# Patient Record
Sex: Female | Born: 1985 | Race: White | Hispanic: No | Smoking: Former smoker
Health system: Southern US, Community
[De-identification: ages and names within clinical notes are randomized; demographics above are authoritative.]

## PROBLEM LIST (undated history)

## (undated) ENCOUNTER — Inpatient Hospital Stay (HOSPITAL_COMMUNITY): Payer: Self-pay

## (undated) DIAGNOSIS — Z87898 Personal history of other specified conditions: Secondary | ICD-10-CM

## (undated) DIAGNOSIS — Z789 Other specified health status: Secondary | ICD-10-CM

## (undated) DIAGNOSIS — Z8742 Personal history of other diseases of the female genital tract: Secondary | ICD-10-CM

## (undated) DIAGNOSIS — B009 Herpesviral infection, unspecified: Secondary | ICD-10-CM

## (undated) DIAGNOSIS — F319 Bipolar disorder, unspecified: Secondary | ICD-10-CM

## (undated) DIAGNOSIS — F1911 Other psychoactive substance abuse, in remission: Secondary | ICD-10-CM

## (undated) DIAGNOSIS — Z8659 Personal history of other mental and behavioral disorders: Secondary | ICD-10-CM

## (undated) DIAGNOSIS — F1010 Alcohol abuse, uncomplicated: Secondary | ICD-10-CM

## (undated) DIAGNOSIS — F419 Anxiety disorder, unspecified: Secondary | ICD-10-CM

## (undated) DIAGNOSIS — F1011 Alcohol abuse, in remission: Secondary | ICD-10-CM

## (undated) DIAGNOSIS — N201 Calculus of ureter: Secondary | ICD-10-CM

## (undated) DIAGNOSIS — F32A Depression, unspecified: Secondary | ICD-10-CM

## (undated) DIAGNOSIS — F329 Major depressive disorder, single episode, unspecified: Secondary | ICD-10-CM

## (undated) DIAGNOSIS — Z915 Personal history of self-harm: Secondary | ICD-10-CM

## (undated) DIAGNOSIS — B1920 Unspecified viral hepatitis C without hepatic coma: Secondary | ICD-10-CM

## (undated) DIAGNOSIS — Z862 Personal history of diseases of the blood and blood-forming organs and certain disorders involving the immune mechanism: Secondary | ICD-10-CM

## (undated) DIAGNOSIS — Z0282 Encounter for adoption services: Secondary | ICD-10-CM

## (undated) HISTORY — DX: Depression, unspecified: F32.A

## (undated) HISTORY — DX: Anxiety disorder, unspecified: F41.9

## (undated) HISTORY — PX: FRACTURE SURGERY: SHX138

## (undated) HISTORY — DX: Major depressive disorder, single episode, unspecified: F32.9

---
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
## 2007-03-18 ENCOUNTER — Inpatient Hospital Stay (HOSPITAL_COMMUNITY): Admission: AD | Admit: 2007-03-18 | Discharge: 2007-03-18 | Payer: Self-pay | Admitting: Obstetrics and Gynecology

## 2007-03-22 ENCOUNTER — Inpatient Hospital Stay (HOSPITAL_COMMUNITY): Admission: AD | Admit: 2007-03-22 | Discharge: 2007-03-23 | Payer: Self-pay | Admitting: Obstetrics and Gynecology

## 2007-04-02 ENCOUNTER — Encounter (INDEPENDENT_AMBULATORY_CARE_PROVIDER_SITE_OTHER): Payer: Self-pay | Admitting: Obstetrics & Gynecology

## 2007-04-02 ENCOUNTER — Inpatient Hospital Stay (HOSPITAL_COMMUNITY): Admission: RE | Admit: 2007-04-02 | Discharge: 2007-04-05 | Payer: Self-pay | Admitting: Obstetrics & Gynecology

## 2008-03-27 HISTORY — PX: CLOSED REDUCTION METACARPAL WITH PERCUTANEOUS PINNING: SHX5613

## 2009-06-28 ENCOUNTER — Emergency Department (HOSPITAL_COMMUNITY): Admission: EM | Admit: 2009-06-28 | Discharge: 2009-06-28 | Payer: Self-pay | Admitting: Emergency Medicine

## 2009-08-01 ENCOUNTER — Emergency Department (HOSPITAL_COMMUNITY): Admission: EM | Admit: 2009-08-01 | Discharge: 2009-08-01 | Payer: Self-pay | Admitting: Emergency Medicine

## 2010-06-21 ENCOUNTER — Inpatient Hospital Stay (HOSPITAL_COMMUNITY): Admission: RE | Admit: 2010-06-21 | Payer: Self-pay | Source: Ambulatory Visit

## 2010-06-21 ENCOUNTER — Inpatient Hospital Stay (INDEPENDENT_AMBULATORY_CARE_PROVIDER_SITE_OTHER)
Admission: RE | Admit: 2010-06-21 | Discharge: 2010-06-21 | Disposition: A | Discharge status: Home / Self Care | Source: Ambulatory Visit | Attending: Family Medicine | Admitting: Family Medicine

## 2010-06-21 DIAGNOSIS — J019 Acute sinusitis, unspecified: Secondary | ICD-10-CM

## 2010-06-21 DIAGNOSIS — J020 Streptococcal pharyngitis: Secondary | ICD-10-CM

## 2010-07-16 ENCOUNTER — Inpatient Hospital Stay (INDEPENDENT_AMBULATORY_CARE_PROVIDER_SITE_OTHER)
Admission: RE | Admit: 2010-07-16 | Discharge: 2010-07-16 | Disposition: A | Discharge status: Home / Self Care | Source: Ambulatory Visit | Attending: Family Medicine | Admitting: Family Medicine

## 2010-07-16 DIAGNOSIS — F329 Major depressive disorder, single episode, unspecified: Secondary | ICD-10-CM

## 2010-07-16 DIAGNOSIS — F411 Generalized anxiety disorder: Secondary | ICD-10-CM

## 2010-08-09 NOTE — H&P (Signed)
NAME:  Allison Richard, Allison Richard NO.:  [id number]   MEDICAL RECORD NO.:  [id number]          PATIENT TYPE:  INP   LOCATION:  NA                            FACILITY:  [facility]   PHYSICIAN:  Freddy Finner, M.D.   DATE OF BIRTH:  04-22-1985   DATE OF ADMISSION:  DATE OF DISCHARGE:                              HISTORY & PHYSICAL   ADMITTING DIAGNOSES:  1. Intrauterine pregnancy at term.  Mature fetus by amniocentesis on      April 01, 2007.  2. History of genital herpes.  3. Clinically small pelvis.  4. Patient requests for primary cesarean delivery.   SECONDARY DIAGNOSIS:  Persistent 6 to 7 cm left ovarian cyst with  secondary procedure to resect the ovarian cyst.   The patient is a 25 year old white female primigravida whose prenatal  course has been complicated by persistent presence of a large left  adnexal mass consistent with a large simple ovarian cyst.  The size  precludes functional type cyst, and she was also known to have a corpus  luteum cyst of the right ovary in early pregnancy.  The cyst has been  intermittently caused pain.  She did have preterm labor which was  arrested with IV hydration and IV pain medication.  She has a relatively  small bony pelvis.  Due to her history of genital herpes and the  probability of a laparotomy for resection of an ovarian cyst in the  postpartum period, the patient is requested primary cesarean delivery to  address resection of the ovarian cyst.  The amniocentesis performed on  April 01, 2007 showed the presence of PG and an LS ratio 5.6 to 1.   CURRENT REVIEW OF SYSTEMS:  Is otherwise negative.   PAST MEDICAL HISTORY:  Recorded in detail in the prenatal summary and  will not be repeated here.  Group B strep culture vaginal was negative  from March 19, 2007.   PHYSICAL EXAMINATION:  HEENT:  Is grossly within normal limits.  Thyroid  gland is not palpably enlarged.  Blood pressure in the office was  100/60.  CHEST:  Is  clear to auscultation.  HEART:  Normal sinus rhythm without murmurs, rubs or gallops.  ABDOMEN:  Is gravid.  Estimated fetal weight on ultrasound on the day  prior admission is 3465 g.  PELVIC EXAMINATION:  Is deferred on the day prior to admission.  EXTREMITIES:  Without cyanosis, clubbing or edema.   ASSESSMENT:  1. Intrauterine pregnancy at term with mature LS ratio and positive PG      by amniocentesis.  2. Genital herpes.  3. Persistent large cystic left adnexal mass.   PLAN:  Primary cesarean delivery and resection of left ovarian cyst.      Freddy Finner, M.D.  Electronically Signed     WRN/MEDQ  D:  04/01/2007  T:  04/02/2007  Job:  [id number]

## 2010-08-09 NOTE — Op Note (Signed)
NAME:  Allison Richard, Allison Richard NO.:  [id number]   MEDICAL RECORD NO.:  [id number]          PATIENT TYPE:  INP   LOCATION:  9123                          FACILITY:  [facility]   PHYSICIAN:  Freddy Finner, M.D.   DATE OF BIRTH:  [birth year]   DATE OF PROCEDURE:  04/02/2007  DATE OF DISCHARGE:                               OPERATIVE REPORT   PREOPERATIVE DIAGNOSES:  1. Intrauterine pregnancy at approximately [redacted] weeks gestation by      ultrasound and [redacted] weeks gestation by dates with known fetal lung      maturity determined by amniocentesis on 04/01/2007 with a 5.1/1 L/S      ratio and positive PG.  2. Persistent 6-7 cm cystic mass in the left adnexa.  3. Positive history of genital herpes.  4. Small bony pelvis.  5. Small-built young white female nulligravida.  6. Patient request for definitive surgical intervention to deal with      the ovarian cyst as well as the delivery of pregnancy.   POSTOPERATIVE DIAGNOSES:  1. Intrauterine pregnancy at approximately [redacted] weeks gestation by      ultrasound and [redacted] weeks gestation by dates with known fetal lung      maturity determined by amniocentesis on 04/01/2007 with a 5.1/1 L/S      ratio and positive PG.  2. Persistent 6-7 cm cystic mass in the left adnexa.  3. Positive history of genital herpes.  4. Small bony pelvis.  5. Small-built young white female nulligravida.  6. Patient request for definitive surgical intervention to deal with      the ovarian cyst as well as the delivery of pregnancy.   OPERATIVE PROCEDURE:  1. Primary low transverse cervical cesarean section with delivery of      viable female infant, Apgar's of 8 and 9.  Birth weight 7 pounds 10      ounces.  2. Left ovarian cystectomy of what appeared to be serous or mucinous      cystadenoma of the left ovary.   SURGEON:  Freddy Finner, M.D.   ANESTHESIA:  Spinal.   ESTIMATED BLOOD LOSS:  800 mL.   INTRAOPERATIVE COMPLICATIONS:  None.   DETAILS OF PRESENT  ILLNESS:  According to the admission note.  The  patient was admitted on the morning of surgery.   DESCRIPTION OF PROCEDURE:  She was brought to the operating room, placed  under adequate spinal anesthesia, placed in the dorsal recumbent  position with elevation of the right hip by 15 degrees.  Pillow was  folded under the patient's knees for prevention sciatica as a  postoperative complication.  Table positioned.  Abdomen was carefully  prepped with Betadine scrub followed by Betadine solution.  The Foley  catheter was placed using sterile technique.  Sterile drapes were then  applied.  Lower abdominal transverse skin incision was made and carried  sharply down to fascia.  Fascia was entered sharply and extended to the  extent of skin incision.  Rectus sheath developed superiorly and  inferiorly with blunt and sharp dissection.  Bleeding subcutaneous and  subfascial vessels were controlled with  Bovie.  Rectus muscle divided in  the midline.  Peritoneum was elevated and entered sharply, extended  bluntly to the extent of the skin incision.  Bladder blade was placed.  Transverse incision was made in the viscera peritoneum overlying the  lower uterine segment, and the bladder dissected off the lower segment.  Transverse incision was made in the lower uterine segment.  Abdomen was  entered.  Fluid was clear.  Incision was extended bluntly in a  transverse direction to the extent of the skin incision.  A Kiwi device  was required for delivery of a viable female infant.  A single loose loop  of nuchal cord was reduced.  Apgar's of 8 and 9 were assigned by the  NICU team in attendance.  The cord was very thin and small with no  umbilical jelly.  Attempt was made to collect an arterial blood sample  but was unsuccessful in collecting that as there was inadequate specimen  for analysis because of the cord.  A venous routine sample was  collected.  Placenta and other products of conception were  removed from  the uterus.  The uterus, tubes and ovaries were delivered onto the  anterior abdominal wall.  The right fallopian tube was normal.  There  was a theca lutein cyst on the right ovary.  The left ovary had theca  lutein components, and the proximal cortical portion, the more distal  portion of the ovary was markedly enlarged with a soft, clear, somewhat  lobulated appearance.  The uterine incision was then closed in a double  layered fashion of running 0 Monocryl used for the first layer with a  locking suture and imbricating suture of 0 Monocryl was used for the  second layer.  Hemostasis was complete.  The bladder peritoneum was re-  approximated with a figure-of-8 in the midline.  Irrigation was carried  out.  Hemostasis was again noted to be complete.  The uterus and right  tube and ovary were wrapped in sloppy wet packs.  A moist pack was  placed under the left ovary.  A careful sharp dissection with scalpel  was made into the superficial capsule of the ovarian cyst, and initially  blunt dissection and then sharp dissection was carried out to partially  free the cyst.  There was dense attachment to the ovarian cortex, and  dissection of this area of the cyst actually ruptured.  A clear yellow  slightly viscus fluid spilled from the cyst.  Great care was taken to  completely excise the entire cyst lining and included with this was some  cortical ovarian tissue which was overlying the superior portion of the  cyst.  Bleeding vessels were then suture ligated with 3-0 Monocryl.  The  cortical defect was then closed with running 3-0 Monocryl sutures.  A  small amount of oozing at the mid portion of the ovary was visually  controlled with a mattress suture of 3-0 Monocryl through the medullary  portion of the ovary.  Again, copious irrigation was carried out.  Hemostasis was confirmed to be complete.  The ovary was wrapped in  Intercede and the uterus and both tubes and ovaries  were delivered back  into the abdominal cavity.  Inspection was made to insure that the  Intercede remained over the left ovary.  All packs, needle and  instrument counts were then confirmed to be complete.  The abdominal  incision was then closed in layers.  A running 0 Monocryl was used  to  close the peritoneum and re-approximate the rectus muscles.  A double-  looped 0 PDS was then use to close the fascia in running fashion.  Subcutaneous tissue was approximated with a running 2-0 plain.  Skin was  closed with skin staples and Steri-Strips.  Sterile compression dressing  was applied.  The patient was taken to the recovery room in satisfactory  condition.      Freddy Finner, M.D.  Electronically Signed     WRN/MEDQ  D:  04/02/2007  T:  04/03/2007  Job:  [id number]

## 2010-08-12 NOTE — Discharge Summary (Signed)
NAME:  Allison Richard, Allison Richard NO.:  [id number]   MEDICAL RECORD NO.:  [id number]          PATIENT TYPE:  INP   LOCATION:  9123                          [facility]ILITY[facility]   PHYSICIAN:  Duke Salvia. Marcelle Overlie, M.D.DATE OF BIRTH:  [birth year]   DATE OF ADMISSION:  04/02/2007  DATE OF DISCHARGE:  04/05/2007                               DISCHARGE SUMMARY   ADMITTING DIAGNOSES:  1. Intrauterine pregnancy at term.  2. History of genital herpes.  3. Clinically small pelvis, desires primary cesarean delivery.  4. Persistent left ovarian cyst.   DISCHARGE DIAGNOSIS:  Status post low transverse cesarean section to a  viable female infant.   PROCEDURE:  1. Primary low transverse cesarean section.  2. Left ovarian cystectomy.   REASON FOR ADMISSION:  Please see dictated H&P.   HOSPITAL COURSE:  The patient is a 26 year old white single female  primigravida that was admitted to [facility] for a  scheduled cesarean section.  The patient did have a history of genital  herpes and was known to have a clinically small pelvis.  The patient  also had a persistent 6-7 cm left ovarian cyst which was thought to be a  large simple ovarian cyst.  On the morning of admission the patient was  taken to operating room where spinal anesthesia was administered without  difficulty.  A low transverse incision was made with delivery of a  viable female infant weighing 7 pounds 10 ounces, Apgars of 8 at one  minute and 9 at five minutes. A left ovarian cystectomy was formed  without difficulty and the patient tolerated the procedure well and was  taken to the recovery room in stable condition.   On postoperative day #1 the patient was without complaint.  Vital signs  were stable.  She is afebrile.  Abdomen soft.  Fundus firm and  nontender.  Abdominal dressing was noted to be clean, dry and intact.   LABORATORY FINDINGS:  Revealed hemoglobin of 7.9, platelet count of  212,0000, WBC count of  12.0.  The patient was started on some iron  supplementation.   On the following morning the patient without complaint.  Vital signs  were stable.  She was afebrile.  Fundus firm and nontender.  Incision  was clean, dry and intact.  She was ambulating well.  On postoperative  day #3 the patient was without complaint.  Vital signs remained stable.  She is afebrile.  Fundus firm and nontender.  Incision was clean, dry  and intact.  Staples removed and the patient was later discharged home.   CONDITION ON DISCHARGE:  Good.   DIET:  Regular as tolerated.   ACTIVITY:  No heavy lifting, no driving x2 weeks.  No vaginal entry.   FOLLOW UP:  The patient to follow up in the office in 1-2 weeks for  incision check.  She is to call for temperature greater than 100  degrees, persistent nausea, vomiting, heavy vaginal bleeding and/or  redness or drainage from incisional site.   DISCHARGE MEDICATION:  1. Tylox #30, one p.o. every 4-6 hours p.r.n.  2. Motrin 600 mg every  6 hours.  3. Prenatal vitamins one p.o. daily.  4. Ferrous sulfate 325 mg one p.o. b.i.d.   Otherwise the patient to return to the office as scheduled.      Julio Sicks, N.P.      Richard M. Marcelle Overlie, M.D.  Electronically Signed    CC/MEDQ  D:  04/16/2007  T:  04/16/2007  Job:  [id number]

## 2010-12-15 LAB — CBC
Hemoglobin: 10.5 — ABNORMAL LOW
Hemoglobin: 7.9 — CL
MCHC: 33.6
MCV: 77.1 — ABNORMAL LOW
Platelets: 212
RBC: 3.04 — ABNORMAL LOW
RBC: 4.06

## 2010-12-15 LAB — RPR: RPR Ser Ql: NONREACTIVE

## 2010-12-30 LAB — URINALYSIS, ROUTINE W REFLEX MICROSCOPIC
Glucose, UA: NEGATIVE
Hgb urine dipstick: NEGATIVE
Nitrite: NEGATIVE
Protein, ur: NEGATIVE
Urobilinogen, UA: 0.2

## 2011-01-12 LAB — URINALYSIS, ROUTINE W REFLEX MICROSCOPIC
Bilirubin Urine: NEGATIVE
Glucose, UA: NEGATIVE
Nitrite: NEGATIVE
pH: 6

## 2011-01-12 LAB — POCT PREGNANCY, URINE
Operator id: 26329
Preg Test, Ur: POSITIVE

## 2011-03-30 ENCOUNTER — Ambulatory Visit (INDEPENDENT_AMBULATORY_CARE_PROVIDER_SITE_OTHER): Payer: 59

## 2011-03-30 DIAGNOSIS — H66009 Acute suppurative otitis media without spontaneous rupture of ear drum, unspecified ear: Secondary | ICD-10-CM

## 2011-04-07 ENCOUNTER — Ambulatory Visit (INDEPENDENT_AMBULATORY_CARE_PROVIDER_SITE_OTHER): Payer: 59

## 2011-04-07 DIAGNOSIS — J309 Allergic rhinitis, unspecified: Secondary | ICD-10-CM

## 2011-04-07 DIAGNOSIS — H6980 Other specified disorders of Eustachian tube, unspecified ear: Secondary | ICD-10-CM

## 2011-10-29 ENCOUNTER — Ambulatory Visit (INDEPENDENT_AMBULATORY_CARE_PROVIDER_SITE_OTHER): Payer: 59 | Admitting: Internal Medicine

## 2011-10-29 VITALS — BP 120/70 | HR 80 | Resp 16 | Ht 64.5 in | Wt 122.0 lb

## 2011-10-29 DIAGNOSIS — G4700 Insomnia, unspecified: Secondary | ICD-10-CM

## 2011-10-29 DIAGNOSIS — F418 Other specified anxiety disorders: Secondary | ICD-10-CM

## 2011-10-29 DIAGNOSIS — F341 Dysthymic disorder: Secondary | ICD-10-CM

## 2011-10-29 DIAGNOSIS — L708 Other acne: Secondary | ICD-10-CM

## 2011-10-29 DIAGNOSIS — L709 Acne, unspecified: Secondary | ICD-10-CM

## 2011-10-29 MED ORDER — DOXYCYCLINE HYCLATE 100 MG PO TABS: 100.0000 mg | ORAL_TABLET | Freq: Two times a day (BID) | ORAL | Status: AC

## 2011-10-29 MED ORDER — TRAZODONE HCL 100 MG PO TABS: 100.0000 mg | ORAL_TABLET | Freq: Every day | ORAL | Status: DC

## 2011-10-29 MED ORDER — SERTRALINE HCL 50 MG PO TABS: ORAL_TABLET | ORAL | Status: DC
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
## 2011-11-01 ENCOUNTER — Emergency Department (HOSPITAL_COMMUNITY)
Admission: EM | Admit: 2011-11-01 | Discharge: 2011-11-02 | Disposition: A | Discharge status: Home / Self Care | Payer: No Typology Code available for payment source | Attending: Emergency Medicine | Admitting: Emergency Medicine

## 2011-11-01 ENCOUNTER — Emergency Department (HOSPITAL_COMMUNITY): Payer: No Typology Code available for payment source

## 2011-11-01 DIAGNOSIS — S51009A Unspecified open wound of unspecified elbow, initial encounter: Secondary | ICD-10-CM | POA: Insufficient documentation

## 2011-11-01 DIAGNOSIS — S0003XA Contusion of scalp, initial encounter: Secondary | ICD-10-CM | POA: Insufficient documentation

## 2011-11-01 DIAGNOSIS — S41112A Laceration without foreign body of left upper arm, initial encounter: Secondary | ICD-10-CM

## 2011-11-01 DIAGNOSIS — S01511A Laceration without foreign body of lip, initial encounter: Secondary | ICD-10-CM

## 2011-11-01 DIAGNOSIS — Y92410 Unspecified street and highway as the place of occurrence of the external cause: Secondary | ICD-10-CM | POA: Insufficient documentation

## 2011-11-01 DIAGNOSIS — Z1881 Retained glass fragments: Secondary | ICD-10-CM | POA: Insufficient documentation

## 2011-11-01 DIAGNOSIS — S01501A Unspecified open wound of lip, initial encounter: Secondary | ICD-10-CM | POA: Insufficient documentation

## 2011-11-01 LAB — POCT I-STAT, CHEM 8
BUN: 11 mg/dL (ref 6–23)
Calcium, Ion: 1.12 mmol/L (ref 1.12–1.23)
Chloride: 105 mEq/L (ref 96–112)
Glucose, Bld: 105 mg/dL — ABNORMAL HIGH (ref 70–99)

## 2011-11-01 MED ORDER — FENTANYL CITRATE 0.05 MG/ML IJ SOLN
50.0000 ug | Freq: Once | INTRAMUSCULAR | Status: AC
  Administered 2011-11-01: 50 ug via INTRAVENOUS
  Filled 2011-11-01: qty 2
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
## 2011-11-01 NOTE — ED Notes (Signed)
X-ray at bedside

## 2011-11-02 ENCOUNTER — Emergency Department (HOSPITAL_COMMUNITY): Payer: No Typology Code available for payment source

## 2011-11-02 ENCOUNTER — Encounter (HOSPITAL_COMMUNITY): Payer: Self-pay | Admitting: Radiology

## 2011-11-02 ENCOUNTER — Other Ambulatory Visit (HOSPITAL_COMMUNITY): Payer: Self-pay

## 2011-11-02 LAB — COMPREHENSIVE METABOLIC PANEL
ALT: 10 U/L (ref 0–35)
AST: 21 U/L (ref 0–37)
CO2: 22 mEq/L (ref 19–32)
Chloride: 103 mEq/L (ref 96–112)
Creatinine, Ser: 0.73 mg/dL (ref 0.50–1.10)
GFR calc non Af Amer: >90 mL/min (ref >90)
Total Bilirubin: 0.5 mg/dL (ref 0.3–1.2)

## 2011-11-02 LAB — URINALYSIS, ROUTINE W REFLEX MICROSCOPIC
Glucose, UA: NEGATIVE mg/dL
Ketones, ur: NEGATIVE mg/dL
Protein, ur: NEGATIVE mg/dL

## 2011-11-02 LAB — CBC
MCH: 29.5 pg (ref 26.0–34.0)
MCHC: 35 g/dL (ref 30.0–36.0)
Platelets: 232 10*3/uL (ref 150–400)
RBC: 4.38 MIL/uL (ref 3.87–5.11)
RDW: 12.7 % (ref 11.5–15.5)

## 2011-11-02 LAB — LACTIC ACID, PLASMA: Lactic Acid, Venous: 2.5 mmol/L — ABNORMAL HIGH (ref 0.5–2.2)

## 2011-11-02 LAB — POCT PREGNANCY, URINE: Preg Test, Ur: NEGATIVE

## 2011-11-02 MED ORDER — MORPHINE SULFATE 4 MG/ML IJ SOLN
4.0000 mg | Freq: Once | INTRAMUSCULAR | Status: AC
  Administered 2011-11-02: 4 mg via INTRAVENOUS
  Filled 2011-11-02: qty 1

## 2011-11-02 MED ORDER — ONDANSETRON HCL 4 MG PO TABS: 4.0000 mg | ORAL_TABLET | Freq: Four times a day (QID) | ORAL | Status: AC

## 2011-11-02 MED ORDER — OXYCODONE-ACETAMINOPHEN 5-325 MG PO TABS
1.0000 | ORAL_TABLET | Freq: Once | ORAL | Status: AC
  Administered 2011-11-02: 1 via ORAL
  Filled 2011-11-02: qty 1

## 2011-11-02 MED ORDER — OXYCODONE-ACETAMINOPHEN 5-325 MG PO TABS: 2.0000 | ORAL_TABLET | ORAL | Status: AC | PRN

## 2011-11-02 MED ORDER — IOHEXOL 300 MG/ML  SOLN
100.0000 mL | Freq: Once | INTRAMUSCULAR | Status: AC | PRN
  Administered 2011-11-02: 100 mL via INTRAVENOUS

## 2011-11-02 MED ORDER — HYDROCODONE-ACETAMINOPHEN 5-325 MG PO TABS: 2.0000 | ORAL_TABLET | ORAL | Status: DC | PRN

## 2011-11-02 MED ORDER — IBUPROFEN 800 MG PO TABS
800.0000 mg | ORAL_TABLET | Freq: Once | ORAL | Status: AC
  Administered 2011-11-02: 800 mg via ORAL
  Filled 2011-11-02: qty 1

## 2011-11-02 MED ORDER — ONDANSETRON 4 MG PO TBDP
8.0000 mg | ORAL_TABLET | Freq: Once | ORAL | Status: AC
  Administered 2011-11-02: 8 mg via ORAL
  Filled 2011-11-02: qty 2

## 2011-11-02 MED ORDER — OXYCODONE-ACETAMINOPHEN 5-325 MG PO TABS: 2.0000 | ORAL_TABLET | ORAL | Status: DC | PRN

## 2011-11-02 NOTE — ED Notes (Signed)
Patient transported to CT 
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
## 2011-11-02 NOTE — ED Notes (Signed)
Dr Norlene Campbell at bedside for suture repair
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
## 2011-11-02 NOTE — ED Notes (Signed)
Pt experienced nausea earlier with some vomiting. Pt received ODT Zofran and then given pain med before discharge. Pt still having some nausea. Dr. Norlene Campbell updated. Pt has rx for zofran. No additional orders.

## 2011-11-07 ENCOUNTER — Ambulatory Visit (INDEPENDENT_AMBULATORY_CARE_PROVIDER_SITE_OTHER): Payer: 59 | Admitting: Internal Medicine

## 2011-11-07 VITALS — BP 102/60 | HR 76 | Temp 98.3°F | Resp 18 | Ht 64.5 in | Wt 120.0 lb

## 2011-11-07 DIAGNOSIS — S01511A Laceration without foreign body of lip, initial encounter: Secondary | ICD-10-CM

## 2011-11-07 DIAGNOSIS — S41102A Unspecified open wound of left upper arm, initial encounter: Secondary | ICD-10-CM

## 2011-11-07 DIAGNOSIS — S41109A Unspecified open wound of unspecified upper arm, initial encounter: Secondary | ICD-10-CM

## 2011-11-07 DIAGNOSIS — S01501A Unspecified open wound of lip, initial encounter: Secondary | ICD-10-CM

## 2011-11-07 MED ORDER — MUPIROCIN CALCIUM 2 % EX CREA: TOPICAL_CREAM | Freq: Three times a day (TID) | CUTANEOUS | Status: AC

## 2011-11-07 NOTE — Patient Instructions (Addendum)
Thank you for coming in today. Please apply vasoline as needed to that lip wound.  When the scab falls off if you see a little blue thread come back and we can pull it out.  The absorbable sutures will fall off/out within 1-3 weeks.  The arm wound looks good. Continue mupericin cream as needed.  Thre back of the head wound should heel well.  If it does not get better please come back.
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
## 2011-12-08 ENCOUNTER — Encounter: Payer: Self-pay | Admitting: Internal Medicine

## 2011-12-08 ENCOUNTER — Ambulatory Visit (INDEPENDENT_AMBULATORY_CARE_PROVIDER_SITE_OTHER): Payer: 59 | Admitting: Internal Medicine

## 2011-12-08 VITALS — BP 93/56 | HR 77 | Temp 97.9°F | Resp 16 | Ht 64.75 in | Wt 121.4 lb

## 2011-12-08 DIAGNOSIS — S139XXA Sprain of joints and ligaments of unspecified parts of neck, initial encounter: Secondary | ICD-10-CM

## 2011-12-08 DIAGNOSIS — F341 Dysthymic disorder: Secondary | ICD-10-CM

## 2011-12-08 DIAGNOSIS — L708 Other acne: Secondary | ICD-10-CM

## 2011-12-08 DIAGNOSIS — S5000XA Contusion of unspecified elbow, initial encounter: Secondary | ICD-10-CM

## 2011-12-08 DIAGNOSIS — L709 Acne, unspecified: Secondary | ICD-10-CM | POA: Insufficient documentation

## 2011-12-08 DIAGNOSIS — G4700 Insomnia, unspecified: Secondary | ICD-10-CM | POA: Insufficient documentation

## 2011-12-08 DIAGNOSIS — S161XXA Strain of muscle, fascia and tendon at neck level, initial encounter: Secondary | ICD-10-CM

## 2011-12-08 DIAGNOSIS — S5002XA Contusion of left elbow, initial encounter: Secondary | ICD-10-CM

## 2011-12-08 DIAGNOSIS — F418 Other specified anxiety disorders: Secondary | ICD-10-CM | POA: Insufficient documentation

## 2011-12-08 DIAGNOSIS — S01511A Laceration without foreign body of lip, initial encounter: Secondary | ICD-10-CM

## 2011-12-08 MED ORDER — PREDNISONE 20 MG PO TABS: ORAL_TABLET | ORAL | Status: DC

## 2011-12-08 MED ORDER — TRAZODONE HCL 100 MG PO TABS: 200.0000 mg | ORAL_TABLET | Freq: Every day | ORAL | Status: DC

## 2011-12-08 MED ORDER — CLINDAMYCIN PHOS-BENZOYL PEROX 1-5 % EX GEL: Freq: Two times a day (BID) | CUTANEOUS | Status: DC

## 2011-12-08 MED ORDER — DOXYCYCLINE HYCLATE 100 MG PO TABS: 100.0000 mg | ORAL_TABLET | Freq: Every day | ORAL | Status: AC

## 2011-12-08 MED ORDER — CYCLOBENZAPRINE HCL 10 MG PO TABS: 10.0000 mg | ORAL_TABLET | Freq: Every day | ORAL | Status: AC

## 2011-12-08 MED ORDER — ALPRAZOLAM 0.5 MG PO TABS: 0.5000 mg | ORAL_TABLET | Freq: Three times a day (TID) | ORAL | Status: DC | PRN

## 2011-12-08 MED ORDER — SERTRALINE HCL 100 MG PO TABS: 200.0000 mg | ORAL_TABLET | Freq: Every day | ORAL | Status: DC
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
## 2012-01-29 ENCOUNTER — Ambulatory Visit (INDEPENDENT_AMBULATORY_CARE_PROVIDER_SITE_OTHER): Payer: 59 | Admitting: Internal Medicine

## 2012-01-29 VITALS — BP 107/71 | HR 121 | Temp 98.0°F | Resp 22 | Wt 124.0 lb

## 2012-01-29 DIAGNOSIS — F489 Nonpsychotic mental disorder, unspecified: Secondary | ICD-10-CM

## 2012-01-29 DIAGNOSIS — Z7289 Other problems related to lifestyle: Secondary | ICD-10-CM

## 2012-01-29 DIAGNOSIS — F341 Dysthymic disorder: Secondary | ICD-10-CM

## 2012-01-29 DIAGNOSIS — F418 Other specified anxiety disorders: Secondary | ICD-10-CM

## 2012-01-29 DIAGNOSIS — G4700 Insomnia, unspecified: Secondary | ICD-10-CM

## 2012-01-29 MED ORDER — QUETIAPINE FUMARATE 50 MG PO TABS: 50.0000 mg | ORAL_TABLET | Freq: Every day | ORAL | Status: DC

## 2012-01-29 MED ORDER — CLONAZEPAM 2 MG PO TABS: 2.0000 mg | ORAL_TABLET | Freq: Three times a day (TID) | ORAL | Status: DC | PRN

## 2012-01-29 NOTE — Patient Instructions (Signed)
Decrease trazodone to 100 mg at bedtime Decrease Zoloft to 150 mg daily Add Seroquel at bedtime Change from xanax to Klonopin for anxiety
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
## 2012-02-05 ENCOUNTER — Ambulatory Visit (INDEPENDENT_AMBULATORY_CARE_PROVIDER_SITE_OTHER): Payer: 59 | Admitting: Internal Medicine

## 2012-02-05 VITALS — BP 112/72 | HR 74 | Temp 97.6°F | Resp 18 | Ht 64.5 in | Wt 118.0 lb

## 2012-02-05 DIAGNOSIS — G4700 Insomnia, unspecified: Secondary | ICD-10-CM

## 2012-02-05 DIAGNOSIS — F418 Other specified anxiety disorders: Secondary | ICD-10-CM

## 2012-02-05 DIAGNOSIS — Z23 Encounter for immunization: Secondary | ICD-10-CM

## 2012-02-05 MED ORDER — CLONAZEPAM 2 MG PO TABS: 2.0000 mg | ORAL_TABLET | Freq: Three times a day (TID) | ORAL | Status: DC | PRN

## 2012-02-05 MED ORDER — SERTRALINE HCL 100 MG PO TABS: 150.0000 mg | ORAL_TABLET | Freq: Every day | ORAL | Status: DC

## 2012-02-05 MED ORDER — QUETIAPINE FUMARATE 50 MG PO TABS: 50.0000 mg | ORAL_TABLET | Freq: Every day | ORAL | Status: DC

## 2012-02-05 MED ORDER — TRAZODONE HCL 50 MG PO TABS: 50.0000 mg | ORAL_TABLET | Freq: Every day | ORAL | Status: DC

## 2012-02-05 NOTE — Progress Notes (Signed)
  Subjective:    Patient ID: Allison Richard, female    DOB: November 28, 1985, 26 y.o.   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 and and her mother has her own set of problems as well  Review of Systems Nausea and vomiting after [place] food last night and has a mild sore throat today    Objective:   Physical Exam  Throat clear except for redness No nodes Psych stable      Assessment & Plan:   1. Insomnia  Decrease trazodone to 50 and discontinued and 34 weeks if stable Continue Seroquel at 50  2. Depression with anxiety  Continue Zoloft at 150 mg Continue Klonopin and try to reduce dose as improves    Meds ordered this encounter  Medications  . clonazePAM (KLONOPIN) 2 MG tablet    Sig: Take 1 tablet (2 mg total) by mouth 3 (three) times daily as needed for anxiety. For 02/27/12 or after    Dispense:  30 tablet    Refill:  1  . sertraline (ZOLOFT) 100 MG tablet    Sig: Take 1.5 tablets (150 mg total) by mouth daily.    Dispense:  45 tablet    Refill:  2  . QUEtiapine (SEROQUEL) 50 MG tablet    Sig: Take 1 tablet (50 mg total) by mouth at bedtime. To begin 02/27/12 or after    Dispense:  30 tablet    Refill:  1  . traZODone (DESYREL) 50 MG tablet    Sig: Take 1 tablet (50 mg total) by mouth at bedtime.    Dispense:  30 tablet    Refill:  1   Followup 4-8 weeks depending on response sooner if worse Flu shot today

## 2012-02-14 ENCOUNTER — Telehealth: Payer: Self-pay

## 2012-02-14 ENCOUNTER — Encounter (HOSPITAL_COMMUNITY): Payer: Self-pay

## 2012-02-14 ENCOUNTER — Emergency Department (HOSPITAL_COMMUNITY)
Admission: EM | Admit: 2012-02-14 | Discharge: 2012-02-15 | Disposition: A | Discharge status: Home / Self Care | Payer: 59 | Attending: Emergency Medicine | Admitting: Emergency Medicine

## 2012-02-14 DIAGNOSIS — F411 Generalized anxiety disorder: Secondary | ICD-10-CM | POA: Insufficient documentation

## 2012-02-14 DIAGNOSIS — F603 Borderline personality disorder: Secondary | ICD-10-CM | POA: Insufficient documentation

## 2012-02-14 DIAGNOSIS — F17200 Nicotine dependence, unspecified, uncomplicated: Secondary | ICD-10-CM | POA: Insufficient documentation

## 2012-02-14 DIAGNOSIS — R002 Palpitations: Secondary | ICD-10-CM | POA: Insufficient documentation

## 2012-02-14 DIAGNOSIS — F410 Panic disorder [episodic paroxysmal anxiety] without agoraphobia: Secondary | ICD-10-CM | POA: Insufficient documentation

## 2012-02-14 DIAGNOSIS — R450 Nervousness: Secondary | ICD-10-CM | POA: Insufficient documentation

## 2012-02-14 DIAGNOSIS — R45851 Suicidal ideations: Secondary | ICD-10-CM | POA: Insufficient documentation

## 2012-02-14 DIAGNOSIS — F3160 Bipolar disorder, current episode mixed, unspecified: Secondary | ICD-10-CM | POA: Insufficient documentation

## 2012-02-14 DIAGNOSIS — R0602 Shortness of breath: Secondary | ICD-10-CM | POA: Insufficient documentation

## 2012-02-14 DIAGNOSIS — Z79899 Other long term (current) drug therapy: Secondary | ICD-10-CM | POA: Insufficient documentation

## 2012-02-14 DIAGNOSIS — F331 Major depressive disorder, recurrent, moderate: Secondary | ICD-10-CM | POA: Insufficient documentation

## 2012-02-14 HISTORY — DX: Bipolar disorder, unspecified: F31.9

## 2012-02-14 LAB — CBC WITH DIFFERENTIAL/PLATELET
Eosinophils Absolute: 0 10*3/uL (ref 0.0–0.7)
Hemoglobin: 13 g/dL (ref 12.0–15.0)
Lymphocytes Relative: 27 % (ref 12–46)
Lymphs Abs: 1.6 10*3/uL (ref 0.7–4.0)
MCH: 29.2 pg (ref 26.0–34.0)
Monocytes Relative: 10 % (ref 3–12)
Neutro Abs: 3.7 10*3/uL (ref 1.7–7.7)
Neutrophils Relative %: 63 % (ref 43–77)
RBC: 4.45 MIL/uL (ref 3.87–5.11)

## 2012-02-14 LAB — COMPREHENSIVE METABOLIC PANEL
Alkaline Phosphatase: 50 U/L (ref 39–117)
BUN: 14 mg/dL (ref 6–23)
CO2: 22 mEq/L (ref 19–32)
Chloride: 103 mEq/L (ref 96–112)
GFR calc Af Amer: >90 mL/min (ref >90)
Glucose, Bld: 83 mg/dL (ref 70–99)
Potassium: 3.3 mEq/L — ABNORMAL LOW (ref 3.5–5.1)
Total Bilirubin: 0.9 mg/dL (ref 0.3–1.2)

## 2012-02-14 LAB — URINALYSIS, ROUTINE W REFLEX MICROSCOPIC
Bilirubin Urine: NEGATIVE
Ketones, ur: NEGATIVE mg/dL
Nitrite: NEGATIVE
Urobilinogen, UA: 1 mg/dL (ref 0.0–1.0)

## 2012-02-14 LAB — PREGNANCY, URINE: Preg Test, Ur: NEGATIVE

## 2012-02-14 MED ORDER — ALUM & MAG HYDROXIDE-SIMETH 200-200-20 MG/5ML PO SUSP: 30.0000 mL | ORAL | Status: DC | PRN

## 2012-02-14 MED ORDER — NICOTINE 21 MG/24HR TD PT24: 21.0000 mg | MEDICATED_PATCH | Freq: Every day | TRANSDERMAL | Status: DC

## 2012-02-14 MED ORDER — LORAZEPAM 1 MG PO TABS
1.0000 mg | ORAL_TABLET | Freq: Three times a day (TID) | ORAL | Status: DC | PRN
  Administered 2012-02-14: 1 mg via ORAL
  Filled 2012-02-14: qty 1

## 2012-02-14 MED ORDER — ONDANSETRON HCL 4 MG PO TABS: 4.0000 mg | ORAL_TABLET | Freq: Three times a day (TID) | ORAL | Status: DC | PRN

## 2012-02-14 MED ORDER — IBUPROFEN 600 MG PO TABS: 600.0000 mg | ORAL_TABLET | Freq: Three times a day (TID) | ORAL | Status: DC | PRN

## 2012-02-14 MED ORDER — ZOLPIDEM TARTRATE 5 MG PO TABS: 5.0000 mg | ORAL_TABLET | Freq: Every evening | ORAL | Status: DC | PRN

## 2012-02-14 NOTE — ED Notes (Signed)
Patient reports that she has frequent panic attacks. Patient states that she is a cutter. Patient states her husband has been asking for a divorce and her mother tells her to cut herself.

## 2012-02-14 NOTE — ED Provider Notes (Signed)
Medical screening examination/treatment/procedure(s) were performed by non-physician practitioner and as supervising physician I was immediately available for consultation/collaboration.  Ethelda Chick, MD 02/14/12 [phone]
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
## 2012-02-14 NOTE — Telephone Encounter (Signed)
PTS MOM JUST WANTS TO MAKE DR DOOLITTLE AWARE THAT THEY HAD TO TAKE DAUGHTER TO EMERGENCY ROOM TONIGHT????

## 2012-02-14 NOTE — ED Provider Notes (Signed)
History     CSN: [id number]  Arrival date & time 02/14/12  1543   First MD Initiated Contact with Patient 02/14/12 1604      Chief Complaint  Patient presents with  . Medical Clearance  . Suicidal    (Consider location/radiation/quality/duration/timing/severity/associated sxs/prior treatment) HPI Comments: 26 year old female with a history of anxiety presents to the emergency department due to having a panic attack causing her difficulty breathing. Admits to frequent panic attacks almost daily. She has been admitted to the hospital multiple times for these panic attacks. She states her husband is deployed in length a divorce, has not spoken to him within the past 7 months. Her mother tells her that she thought her mother and "to go cut herself". Patient admits to hurting himself by cutting. Denies homicidal ideations. Denies alcohol or drug use. States she tries to see her primary care physician Dr. Merla Riches as frequent as possible to avoid admission for anxiety.  The history is provided by the patient. The history is limited by the condition of the patient.    Past Medical History  Diagnosis Date  . Anxiety   . Deliberate self-cutting   . Panic attack   . Bipolar 1 disorder     Past Surgical History  Procedure Date  . Cesarean section   . Fracture surgery   . Tonsilectomy, adenoidectomy, bilateral myringotomy and tubes     Family History  Problem Relation Age of Onset  . Bipolar disorder Brother     History  Substance Use Topics  . Smoking status: Current Some Day Smoker  . Smokeless tobacco: Never Used  . Alcohol Use: Yes     Comment: socially    OB History    Grav Para Term Preterm Abortions TAB SAB Ect Mult Living                  Review of Systems  Respiratory: Positive for shortness of breath.   Cardiovascular: Positive for palpitations.  Psychiatric/Behavioral: Positive for suicidal ideas and self-injury. Negative for hallucinations. The patient is  nervous/anxious.   All other systems reviewed and are negative.    Allergies  Vicodin  Home Medications   Current Outpatient Rx  Name  Route  Sig  Dispense  Refill  . CLONAZEPAM 2 MG PO TABS   Oral   Take 1 tablet (2 mg total) by mouth 3 (three) times daily as needed for anxiety. For 02/27/12 or after   30 tablet   1   . QUETIAPINE FUMARATE 50 MG PO TABS   Oral   Take 1 tablet (50 mg total) by mouth at bedtime. To begin 02/27/12 or after   30 tablet   1   . SERTRALINE HCL 100 MG PO TABS   Oral   Take 1.5 tablets (150 mg total) by mouth daily.   45 tablet   2   . TRAZODONE HCL 100 MG PO TABS   Oral   Take 100 mg by mouth at bedtime.           BP 143/97  Pulse 142  Temp 98.2 F (36.8 C) (Oral)  Resp 22  SpO2 100%  Physical Exam  Constitutional: She appears well-developed and well-nourished.       Anxious, tearful  HENT:  Head: Normocephalic and atraumatic.  Eyes: Conjunctivae normal and EOM are normal. Pupils are equal, round, and reactive to light.  Neck: Normal range of motion. Neck supple.  Cardiovascular: Regular rhythm and normal heart sounds.  Tachycardia present.   Pulmonary/Chest: Breath sounds normal. Tachypnea noted.  Skin:     Psychiatric: Her mood appears anxious. Her speech is rapid and/or pressured. She is is hyperactive. She exhibits a depressed mood. She expresses suicidal ideation. She expresses no homicidal ideation. She expresses suicidal plans. She expresses no homicidal plans.    ED Course  Procedures (including critical care time)  Labs Reviewed - No data to display No results found.   No diagnosis found.    MDM  26 y/o female with anxiety attack and SI. ACT team consulted and will evaluate patient.        Trevor Mace, PA-C 02/14/12 1621

## 2012-02-14 NOTE — ED Notes (Signed)
Contact Father- Allison Richard [phone]

## 2012-02-15 NOTE — ED Provider Notes (Signed)
Patient has been seen by telepsych, Dr Jacky Kindle.  He does not feel pt meets Baker Act critera, and is showing regressed attention seeking behavior.  He feels she would benefit from outpatient f/u, and is safe for d/c.  Olivia Mackie, MD 02/15/12 [phone]

## 2012-02-29 ENCOUNTER — Ambulatory Visit (INDEPENDENT_AMBULATORY_CARE_PROVIDER_SITE_OTHER): Payer: 59 | Admitting: Family Medicine

## 2012-02-29 ENCOUNTER — Emergency Department (HOSPITAL_COMMUNITY): Payer: 59

## 2012-02-29 ENCOUNTER — Emergency Department (HOSPITAL_COMMUNITY)
Admission: EM | Admit: 2012-02-29 | Discharge: 2012-02-29 | Disposition: A | Discharge status: Home / Self Care | Payer: 59 | Attending: Emergency Medicine | Admitting: Emergency Medicine

## 2012-02-29 ENCOUNTER — Encounter (HOSPITAL_COMMUNITY): Payer: Self-pay | Admitting: *Deleted

## 2012-02-29 VITALS — BP 110/74 | HR 110 | Resp 18 | Ht 65.0 in | Wt 121.0 lb

## 2012-02-29 DIAGNOSIS — R079 Chest pain, unspecified: Secondary | ICD-10-CM

## 2012-02-29 DIAGNOSIS — F17200 Nicotine dependence, unspecified, uncomplicated: Secondary | ICD-10-CM | POA: Insufficient documentation

## 2012-02-29 DIAGNOSIS — F411 Generalized anxiety disorder: Secondary | ICD-10-CM

## 2012-02-29 DIAGNOSIS — R209 Unspecified disturbances of skin sensation: Secondary | ICD-10-CM

## 2012-02-29 DIAGNOSIS — Z79899 Other long term (current) drug therapy: Secondary | ICD-10-CM | POA: Insufficient documentation

## 2012-02-29 DIAGNOSIS — F319 Bipolar disorder, unspecified: Secondary | ICD-10-CM | POA: Insufficient documentation

## 2012-02-29 DIAGNOSIS — F419 Anxiety disorder, unspecified: Secondary | ICD-10-CM

## 2012-02-29 DIAGNOSIS — R0789 Other chest pain: Secondary | ICD-10-CM | POA: Insufficient documentation

## 2012-02-29 DIAGNOSIS — R202 Paresthesia of skin: Secondary | ICD-10-CM

## 2012-02-29 LAB — CBC WITH DIFFERENTIAL/PLATELET
Eosinophils Absolute: 0 10*3/uL (ref 0.0–0.7)
Eosinophils Relative: 0 % (ref 0–5)
HCT: 35.9 % — ABNORMAL LOW (ref 36.0–46.0)
Lymphs Abs: 1.7 10*3/uL (ref 0.7–4.0)
MCH: 29.6 pg (ref 26.0–34.0)
MCV: 84.9 fL (ref 78.0–100.0)
Monocytes Absolute: 0.9 10*3/uL (ref 0.1–1.0)
Platelets: 193 10*3/uL (ref 150–400)
RBC: 4.23 MIL/uL (ref 3.87–5.11)
RDW: 12.9 % (ref 11.5–15.5)

## 2012-02-29 LAB — POCT I-STAT TROPONIN I: Troponin i, poc: 0 ng/mL (ref 0.00–0.08)

## 2012-02-29 LAB — BASIC METABOLIC PANEL
Calcium: 9.5 mg/dL (ref 8.4–10.5)
Creatinine, Ser: 0.78 mg/dL (ref 0.50–1.10)
GFR calc non Af Amer: >90 mL/min (ref >90)
Glucose, Bld: 81 mg/dL (ref 70–99)
Sodium: 140 mEq/L (ref 135–145)

## 2012-02-29 LAB — URINALYSIS, ROUTINE W REFLEX MICROSCOPIC
Bilirubin Urine: NEGATIVE
Glucose, UA: NEGATIVE mg/dL
Ketones, ur: NEGATIVE mg/dL
Protein, ur: NEGATIVE mg/dL
Urobilinogen, UA: 0.2 mg/dL (ref 0.0–1.0)

## 2012-02-29 LAB — RAPID URINE DRUG SCREEN, HOSP PERFORMED
Amphetamines: NOT DETECTED
Barbiturates: POSITIVE — AB
Opiates: POSITIVE — AB
Tetrahydrocannabinol: NOT DETECTED

## 2012-02-29 LAB — URINE MICROSCOPIC-ADD ON

## 2012-02-29 MED ORDER — GI COCKTAIL ~~LOC~~
30.0000 mL | Freq: Once | ORAL | Status: AC
  Administered 2012-02-29: 30 mL via ORAL

## 2012-02-29 MED ORDER — METHOCARBAMOL 500 MG PO TABS: 500.0000 mg | ORAL_TABLET | Freq: Two times a day (BID) | ORAL | Status: DC

## 2012-02-29 MED ORDER — NAPROXEN 500 MG PO TABS: 500.0000 mg | ORAL_TABLET | Freq: Two times a day (BID) | ORAL | Status: DC

## 2012-02-29 MED ORDER — MORPHINE SULFATE 4 MG/ML IJ SOLN
4.0000 mg | Freq: Once | INTRAMUSCULAR | Status: AC
  Administered 2012-02-29: 4 mg via INTRAVENOUS
  Filled 2012-02-29: qty 1

## 2012-02-29 MED ORDER — ALPRAZOLAM 0.25 MG PO TABS
0.2500 mg | ORAL_TABLET | Freq: Once | ORAL | Status: AC
  Administered 2012-02-29: 0.25 mg via ORAL

## 2012-02-29 NOTE — Progress Notes (Signed)
Subjective: 26 year old lady who is here with chest pain. She started having pain early this morning, smoke her mother said she would probably feel better if she vomited because she is nauseated. She vomited. She went back to bed and slept a couple of hours. She awakened with chest pain. She came in here after trying some Nexium and Maalox. She complains of been hurting in the substernal region with numbness in both for hands.   She takes her medications regularly. Dr. Merla Riches is her primary care doctor. She's had a lot of emotional issues, and has a lot of stress in her life. She-year-old child. 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 She felt okay last night. She drank one beer. She is not using drugs. She occasionally smokes a cigarette socially. Does not use any drugs  Objective: Very anxious young lady holding her chest and rolling back and forth. She complains of her hands being. She is mildly hyperventilating when I entered the room. Her chest is clear. Heart regular without murmurs gallops or arrhythmias. Abdomen soft without masses or tenderness.  EKG No acute changes. Lots of background artifact but finally got a reasonable prescription.  Assessment: Chest pain Anxiety GERD  Plan:  Patient continues to complain of severe substernal chest pain in her arms. I explained to her that we could not do a lot of further testing on her, and I felt that with her persistence of symptoms even though her statistical risks were RICHARDSON onto the emergency room. She says her father had a massive heart attack at 52.

## 2012-02-29 NOTE — ED Notes (Signed)
Radiology called for the second time for results of chest xray that was done 2 hrs ago. They said they would call me right back.

## 2012-02-29 NOTE — ED Provider Notes (Signed)
History     CSN: [id number]  Arrival date & time 02/29/12  1706   First MD Initiated Contact with Patient 02/29/12 1707      Chief Complaint  Patient presents with  . Chest Pain    (Consider location/radiation/quality/duration/timing/severity/associated sxs/prior treatment) HPI Comments: This is a 26 year old female, who presents emergency department with chief complaint of substernal chest pain which started when she awakened this morning. Patient states the pain has been constant and cramping in nature. She states it feels like her chest is having contractions. Patient states that her pain is 7/10. The pain is worsened with deep breathing and with palpation. Patient has tried taking antacids with little relief. She has past medical history remarkable for anxiety which causes her to have chest pain and shortness of breath, but she states that this is different. She denies any recent travel, no recent surgery, the pain is now worse or better with position. She states that her father had a massive MI at age 50.  The history is provided by the patient. No language interpreter was used.    Past Medical History  Diagnosis Date  . Anxiety   . Deliberate self-cutting   . Panic attack   . Bipolar 1 disorder     Past Surgical History  Procedure Date  . Cesarean section   . Fracture surgery   . Tonsilectomy, adenoidectomy, bilateral myringotomy and tubes     Family History  Problem Relation Age of Onset  . Bipolar disorder Brother     History  Substance Use Topics  . Smoking status: Current Some Day Smoker  . Smokeless tobacco: Never Used  . Alcohol Use: Yes     Comment: socially    OB History    Grav Para Term Preterm Abortions TAB SAB Ect Mult Living                  Review of Systems  All other systems reviewed and are negative.    Allergies  Vicodin  Home Medications   Current Outpatient Rx  Name  Route  Sig  Dispense  Refill  . CLONAZEPAM 2 MG PO TABS  Oral   Take 1 tablet (2 mg total) by mouth 3 (three) times daily as needed for anxiety. For 02/27/12 or after   30 tablet   1   . QUETIAPINE FUMARATE 50 MG PO TABS   Oral   Take 1 tablet (50 mg total) by mouth at bedtime. To begin 02/27/12 or after   30 tablet   1   . SERTRALINE HCL 100 MG PO TABS   Oral   Take 1.5 tablets (150 mg total) by mouth daily.   45 tablet   2   . TRAZODONE HCL 100 MG PO TABS   Oral   Take 100 mg by mouth at bedtime.           BP 125/76  Pulse 96  Temp 98.1 F (36.7 C) (Oral)  Resp 16  SpO2 98%  Physical Exam  Nursing note and vitals reviewed. Constitutional: She is oriented to person, place, and time. She appears well-developed and well-nourished.  HENT:  Head: Normocephalic and atraumatic.  Eyes: Conjunctivae normal and EOM are normal. Pupils are equal, round, and reactive to light.  Neck: Normal range of motion. Neck supple.  Cardiovascular: Normal rate and regular rhythm.  Exam reveals no gallop and no friction rub.   No murmur heard. Pulmonary/Chest: Effort normal and breath sounds normal. No respiratory  distress. She has no wheezes. She has no rales. She exhibits no tenderness.       The sternum is tender to palpation, patient unwilling to breathe deeply 2/2 pain.  Abdominal: Soft. Bowel sounds are normal. She exhibits no distension and no mass. There is no tenderness. There is no rebound and no guarding.  Musculoskeletal: Normal range of motion. She exhibits no edema and no tenderness.  Neurological: She is alert and oriented to person, place, and time.  Skin: Skin is warm and dry.  Psychiatric: She has a normal mood and affect. Her behavior is normal. Judgment and thought content normal.    ED Course  Procedures (including critical care time)   Labs Reviewed  CBC WITH DIFFERENTIAL  BASIC METABOLIC PANEL  URINALYSIS, ROUTINE W REFLEX MICROSCOPIC  URINE RAPID DRUG SCREEN (HOSP PERFORMED)   No results found. ED ECG REPORT  I  personally interpreted this EKG   Date: 02/29/2012   Rate: 94  Rhythm: normal sinus rhythm  QRS Axis: normal  Intervals: normal  ST/T Wave abnormalities: nonspecific T wave changes  Conduction Disutrbances:none  Narrative Interpretation:   Old EKG Reviewed: none available  Results for orders placed during the hospital encounter of 02/29/12  CBC WITH DIFFERENTIAL      Component Value Range   WBC 10.8 (*) 4.0 - 10.5 K/uL   RBC 4.23  3.87 - 5.11 MIL/uL   Hemoglobin 12.5  12.0 - 15.0 g/dL   HCT 62.1 (*) 30.8 - 65.7 %   MCV 84.9  78.0 - 100.0 fL   MCH 29.6  26.0 - 34.0 pg   MCHC 34.8  30.0 - 36.0 g/dL   RDW 84.6  96.2 - 95.2 %   Platelets 193  150 - 400 K/uL   Neutrophils Relative 76  43 - 77 %   Neutro Abs 8.1 (*) 1.7 - 7.7 K/uL   Lymphocytes Relative 16  12 - 46 %   Lymphs Abs 1.7  0.7 - 4.0 K/uL   Monocytes Relative 8  3 - 12 %   Monocytes Absolute 0.9  0.1 - 1.0 K/uL   Eosinophils Relative 0  0 - 5 %   Eosinophils Absolute 0.0  0.0 - 0.7 K/uL   Basophils Relative 0  0 - 1 %   Basophils Absolute 0.0  0.0 - 0.1 K/uL  BASIC METABOLIC PANEL      Component Value Range   Sodium 140  135 - 145 mEq/L   Potassium 3.3 (*) 3.5 - 5.1 mEq/L   Chloride 105  96 - 112 mEq/L   CO2 25  19 - 32 mEq/L   Glucose, Bld 81  70 - 99 mg/dL   BUN 11  6 - 23 mg/dL   Creatinine, Ser 8.41  0.50 - 1.10 mg/dL   Calcium 9.5  8.4 - 32.4 mg/dL   GFR calc non Af Amer >90  >90 mL/min   GFR calc Af Amer >90  >90 mL/min  URINALYSIS, ROUTINE W REFLEX MICROSCOPIC      Component Value Range   Color, Urine YELLOW  YELLOW   APPearance CLOUDY (*) CLEAR   Specific Gravity, Urine 1.018  1.005 - 1.030   pH 8.5 (*) 5.0 - 8.0   Glucose, UA NEGATIVE  NEGATIVE mg/dL   Hgb urine dipstick TRACE (*) NEGATIVE   Bilirubin Urine NEGATIVE  NEGATIVE   Ketones, ur NEGATIVE  NEGATIVE mg/dL   Protein, ur NEGATIVE  NEGATIVE mg/dL   Urobilinogen, UA 0.2  0.0 - 1.0 mg/dL   Nitrite NEGATIVE  NEGATIVE   Leukocytes, UA NEGATIVE   NEGATIVE  URINE RAPID DRUG SCREEN (HOSP PERFORMED)      Component Value Range   Opiates POSITIVE (*) NONE DETECTED   Cocaine NONE DETECTED  NONE DETECTED   Benzodiazepines NONE DETECTED  NONE DETECTED   Amphetamines NONE DETECTED  NONE DETECTED   Tetrahydrocannabinol NONE DETECTED  NONE DETECTED   Barbiturates POSITIVE (*) NONE DETECTED  POCT I-STAT TROPONIN I      Component Value Range   Troponin i, poc 0.00  0.00 - 0.08 ng/mL   Comment 3           URINE MICROSCOPIC-ADD ON      Component Value Range   Squamous Epithelial / LPF FEW (*) RARE   WBC, UA 0-2  <3 WBC/hpf   RBC / HPF 0-2  <3 RBC/hpf   Bacteria, UA FEW (*) RARE   Urine-Other MUCOUS PRESENT     Dg Chest 2 View  02/29/2012  *RADIOLOGY REPORT*  Clinical Data: Chest pain  CHEST - 2 VIEW  Comparison: None.  Findings: The heart, mediastinal, and hilar contours are normal. The trachea is midline.  The pulmonary vascularity is normal.  The lungs are well expanded and clear.  The bones are unremarkable.  The visualized upper abdomen is unremarkable.  IMPRESSION: No acute cardiopulmonary disease.   Original Report Authenticated By: Britta Mccreedy, M.D.       1. Chest pain, unspecified       MDM  26 year old female with substernal chest pain. The pain is worsened with palpation, I suspect musculoskeletal origin. I have discussed this patient with Dr. Estell Harpin, I'm going to discharge the patient to home with primary care followup. Patient understands and agrees with the plan. She is stable and ready for discharge.       Roxy Horseman, PA-C 02/29/12 2023

## 2012-02-29 NOTE — ED Provider Notes (Signed)
Medical screening examination/treatment/procedure(s) were performed by non-physician practitioner and as supervising physician I was immediately available for consultation/collaboration.   Lakina Mcintire L Avyana Puffenbarger, MD 02/29/12 2321 

## 2012-02-29 NOTE — Patient Instructions (Addendum)
Return if necessary

## 2012-02-29 NOTE — ED Notes (Signed)
Per EMS- pt has had substernal chest pain that started this morning when she woke up. Pt describes it as cramping. Pain is worse with inspiration and palpation. Pt took OTC acid reducer at home. Pt was sent from [facility] urgent care.

## 2012-03-16 ENCOUNTER — Ambulatory Visit (INDEPENDENT_AMBULATORY_CARE_PROVIDER_SITE_OTHER): Payer: 59 | Admitting: Physician Assistant

## 2012-03-16 VITALS — BP 104/67 | HR 97 | Temp 98.1°F | Resp 16 | Ht 65.0 in | Wt 123.0 lb

## 2012-03-16 DIAGNOSIS — IMO0002 Reserved for concepts with insufficient information to code with codable children: Secondary | ICD-10-CM

## 2012-03-16 DIAGNOSIS — K651 Peritoneal abscess: Secondary | ICD-10-CM

## 2012-03-16 DIAGNOSIS — J029 Acute pharyngitis, unspecified: Secondary | ICD-10-CM

## 2012-03-16 LAB — POCT RAPID STREP A (OFFICE): Rapid Strep A Screen: NEGATIVE

## 2012-03-16 MED ORDER — NAPROXEN 500 MG PO TABS: ORAL_TABLET | ORAL | Status: DC

## 2012-03-16 MED ORDER — DOXYCYCLINE HYCLATE 100 MG PO TABS: 100.0000 mg | ORAL_TABLET | Freq: Two times a day (BID) | ORAL | Status: DC

## 2012-03-16 NOTE — Progress Notes (Signed)
  Subjective:    Patient ID: Allison Richard, female    DOB: 20-May-1985, 26 y.o.   MRN: [id number]  HPI  26 yr old CF presents with sore throat and slight fever(she hasnt checked temp).  No cough or runny nose.  Also, she is c/o skin infection at the piercing of the inferior aspect of the umbilicus.   Review of Systems  All other systems reviewed and are negative.       Objective:   Physical Exam  Nursing note and vitals reviewed. Constitutional: She is oriented to person, place, and time. She appears well-developed and well-nourished.  HENT:  Head: Normocephalic and atraumatic.  Right Ear: External ear normal.  Left Ear: External ear normal.  Nose: Nose normal.  Mouth/Throat: Oropharynx is clear and moist. No oropharyngeal exudate (throat with erythema, tonsils absent, no exudate).  Neck: Normal range of motion. Neck supple.  Cardiovascular: Normal rate, regular rhythm and normal heart sounds.   Pulmonary/Chest: Effort normal and breath sounds normal.  Lymphadenopathy:    She has cervical adenopathy (shotty AC nodes B).  Neurological: She is alert and oriented to person, place, and time.  Skin: Skin is warm and dry. There is erythema.       Inferior aspect of umbilicus-3X3cm firm induration and pale erythema.  No central fluctuance. The patient has removed the jewelry already.  I am able to express a small amount of purulence and obtained for culture.   Results for orders placed in visit on 03/16/12  POCT RAPID STREP A (OFFICE)      Component Value Range   Rapid Strep A Screen Negative  Negative      Assessment & Plan:  Abscess inferior aspect of umbilicus-start Doxy. Would care discussed. Pharyngitis-salt water gargles, prescription for naprosyn sent.  Fluids, rest

## 2012-03-18 ENCOUNTER — Telehealth: Payer: Self-pay

## 2012-03-18 MED ORDER — FIRST-DUKES MOUTHWASH MT SUSP: 10.0000 mL | OROMUCOSAL | Status: DC | PRN

## 2012-03-18 NOTE — Telephone Encounter (Signed)
I have sent magic mouthwash to her pharmacy for relief of sore throat.  May use as frequently as every 2 hours.  If she is not seeing improvement, or if she is worsening, she will need to come back in.

## 2012-03-18 NOTE — Telephone Encounter (Signed)
Pt is not any better and would like to know what to do next with out having to come back in   Best number [phone]

## 2012-03-18 NOTE — Telephone Encounter (Signed)
Spoke with patient and still having sore throat. No other sxs just still having sore throat. Everything she eats drinks stings. Talking makes it hurt. Please advise

## 2012-03-19 LAB — WOUND CULTURE
Gram Stain: NONE SEEN
Gram Stain: NONE SEEN

## 2012-03-19 NOTE — Telephone Encounter (Signed)
Advised pt that rx is ready and that she can either gargle or swallow med.

## 2012-03-29 ENCOUNTER — Ambulatory Visit (INDEPENDENT_AMBULATORY_CARE_PROVIDER_SITE_OTHER): Payer: 59 | Admitting: Emergency Medicine

## 2012-03-29 ENCOUNTER — Ambulatory Visit
Admission: RE | Admit: 2012-03-29 | Discharge: 2012-03-29 | Disposition: A | Discharge status: Home / Self Care | Payer: 59 | Source: Ambulatory Visit | Attending: Emergency Medicine | Admitting: Emergency Medicine

## 2012-03-29 ENCOUNTER — Telehealth: Payer: Self-pay | Admitting: Emergency Medicine

## 2012-03-29 VITALS — BP 97/65 | HR 100 | Temp 98.4°F | Resp 16 | Ht 65.0 in | Wt 120.0 lb

## 2012-03-29 DIAGNOSIS — R590 Localized enlarged lymph nodes: Secondary | ICD-10-CM

## 2012-03-29 DIAGNOSIS — R599 Enlarged lymph nodes, unspecified: Secondary | ICD-10-CM

## 2012-03-29 DIAGNOSIS — J029 Acute pharyngitis, unspecified: Secondary | ICD-10-CM

## 2012-03-29 LAB — POCT CBC
Hemoglobin: 13.1 g/dL (ref 12.2–16.2)
MPV: 8.2 fL (ref 0–99.8)
POC Granulocyte: 11.8 — AB (ref 2–6.9)
POC MID %: 4.6 %M (ref 0–12)
RBC: 4.62 M/uL (ref 4.04–5.48)

## 2012-03-29 LAB — COMPREHENSIVE METABOLIC PANEL
ALT: <8 U/L (ref 0–35)
AST: 14 U/L (ref 0–37)
CO2: 28 mEq/L (ref 19–32)
Chloride: 100 mEq/L (ref 96–112)
Sodium: 139 mEq/L (ref 135–145)
Total Bilirubin: 0.8 mg/dL (ref 0.3–1.2)
Total Protein: 7.4 g/dL (ref 6.0–8.3)

## 2012-03-29 MED ORDER — CLINDAMYCIN HCL 300 MG PO CAPS: 300.0000 mg | ORAL_CAPSULE | Freq: Three times a day (TID) | ORAL | Status: DC

## 2012-03-29 MED ORDER — ACETAMINOPHEN 325 MG PO TABS
1000.0000 mg | ORAL_TABLET | Freq: Once | ORAL | Status: AC
  Administered 2012-03-29: 1000 mg via ORAL

## 2012-03-29 MED ORDER — IOHEXOL 300 MG/ML  SOLN
75.0000 mL | Freq: Once | INTRAMUSCULAR | Status: AC | PRN
  Administered 2012-03-29: 75 mL via INTRAVENOUS

## 2012-03-29 NOTE — Progress Notes (Signed)
  Subjective:    Patient ID: Allison Richard, female    DOB: 12-Jun-1985, 27 y.o.   MRN: [id number]  HPI Pt presents to clinic today with a golf-sized ball swelling on the Rt side of her neck. She states it is painful to move her neck from side to side. Patient was seen here recently before Christmas with a negative strep test and an infection on her abdominal wall and treated with doxycycline. She started to have recurrence of her neck pain about 48 as ago and then developed an enlarging node in the right side of her neck. She does have a cath. She does intermittently get scratched by her cat    Review of Systems     Objective:   Physical Exam HEENT exam reveals a 3 x 3 cm node present in the right anterior cervical area the left anterior and posterior cervical areas are normal she is status post tonsillectomy. There are no axillary nodes present there are no supraclavicular nodes present. There is mild left upper abdominal tenderness but no splenomegaly. Results for orders placed in visit on 03/29/12  POCT CBC      Component Value Range   WBC 14.0 (*) 4.6 - 10.2 K/uL   Lymph, poc 1.5  0.6 - 3.4   POC LYMPH PERCENT 10.9  10 - 50 %L   MID (cbc) 0.6  0 - 0.9   POC MID % 4.6  0 - 12 %M   POC Granulocyte 11.8 (*) 2 - 6.9   Granulocyte percent 84.5 (*) 37 - 80 %G   RBC 4.62  4.04 - 5.48 M/uL   Hemoglobin 13.1  12.2 - 16.2 g/dL   HCT, POC 78.2  95.6 - 47.9 %   MCV 90.7  80 - 97 fL   MCH, POC 28.4  27 - 31.2 pg   MCHC 31.3 (*) 31.8 - 35.4 g/dL   RDW, POC 21.3     Platelet Count, POC 307  142 - 424 K/uL   MPV 8.2  0 - 99.8 fL         Assessment & Plan:   We will check routine labs. Further workup will  be dependent on her labs . Since the white count is elevated we'll proceed with a CT neck to rule out abscess. Testing was done for EBV CMV and cat scratch disease. We'll treat with Cleocin 303 times a day for now.

## 2012-03-29 NOTE — Telephone Encounter (Signed)
Patient advised to get meds filled. Recheck Sunday

## 2012-03-29 NOTE — Telephone Encounter (Signed)
Patients scan came in and she does not have abscess, Dr Cleta Alberts has already called her.

## 2012-03-30 ENCOUNTER — Telehealth: Payer: Self-pay | Admitting: Emergency Medicine

## 2012-03-30 NOTE — Telephone Encounter (Signed)
I gave patient results of her CT. She is to followup on Sunday for a recheck of her nodes.

## 2012-03-31 ENCOUNTER — Ambulatory Visit (INDEPENDENT_AMBULATORY_CARE_PROVIDER_SITE_OTHER): Payer: 59 | Admitting: Emergency Medicine

## 2012-03-31 VITALS — BP 107/73 | HR 80 | Temp 98.0°F | Resp 16 | Ht 64.0 in | Wt 120.0 lb

## 2012-03-31 DIAGNOSIS — R599 Enlarged lymph nodes, unspecified: Secondary | ICD-10-CM

## 2012-03-31 DIAGNOSIS — R591 Generalized enlarged lymph nodes: Secondary | ICD-10-CM

## 2012-03-31 DIAGNOSIS — R079 Chest pain, unspecified: Secondary | ICD-10-CM

## 2012-03-31 LAB — POCT CBC
Granulocyte percent: 50.1 %G (ref 37–80)
HCT, POC: 41.7 % (ref 37.7–47.9)
Hemoglobin: 13.1 g/dL (ref 12.2–16.2)
Lymph, poc: 2.1 (ref 0.6–3.4)
MCHC: 31.4 g/dL — AB (ref 31.8–35.4)
MPV: 8.4 fL (ref 0–99.8)
POC Granulocyte: 2.4 (ref 2–6.9)
RBC: 4.67 M/uL (ref 4.04–5.48)

## 2012-03-31 LAB — POCT SEDIMENTATION RATE: POCT SED RATE: 24 mm/hr — AB (ref 0–22)

## 2012-03-31 NOTE — Progress Notes (Signed)
  Subjective:    Patient ID: Allison Richard, female    DOB: 01/05/1986, 27 y.o.   MRN: [id number]  HPI Pt is here for a follow up--states she feels horrible. She feels node may have gone down in size. She says her chest is also still hurting. She was in here a few weeks ago and had an EKG and was sent to the hospital to make sure she didn't have a PE. She has been taking tylenol for the pain. She also is complaining of significant chest discomfort when she turns or twists or lives her arm. She does feel like a node in the left side of her neck has decreased in size to she had a CT done 2 days ago and it showed multiple enlarged lymph nodes consistent with an infectious process. She is currently on Cleocin for this.    Review of Systems     Objective:   Physical Exam HEENT exam is remarkable for a 2 x 2 cm right anterior cervical node there is shoddy adenopathy present anterior and posterior cervical. The posterior pharynx shows a tonsillectomy but no redness and no evidence of a retropharyngeal abscess. Her chest was clear to auscultation. Her cardiac exam did not reveal a rub. No murmurs were heard. There is exquisite tenderness over the costosternal junctions of the second to the fourth ribs on both sides.  Results for orders placed in visit on 03/31/12  POCT CBC      Component Value Range   WBC 4.8  4.6 - 10.2 K/uL   Lymph, poc 2.1  0.6 - 3.4   POC LYMPH PERCENT 43.7  10 - 50 %L   MID (cbc) 0.3  0 - 0.9   POC MID % 6.2  0 - 12 %M   POC Granulocyte 2.4  2 - 6.9   Granulocyte percent 50.1  37 - 80 %G   RBC 4.67  4.04 - 5.48 M/uL   Hemoglobin 13.1  12.2 - 16.2 g/dL   HCT, POC 40.9  81.1 - 47.9 %   MCV 89.2  80 - 97 fL   MCH, POC 28.1  27 - 31.2 pg   MCHC 31.4 (*) 31.8 - 35.4 g/dL   RDW, POC 91.4     Platelet Count, POC 298  142 - 424 K/uL   MPV 8.4  0 - 99.8 fL   EKG normal sinus rhythm .     Assessment & Plan:  White count is significantly better on Cleocin. She will continue  her antibiotics. She is advised to take either Advil or Aleve for her chest discomfort. Recheck on Thursday.

## 2012-04-01 LAB — CMV IGM: CMV IgM: <8 AU/mL (ref <30.00)

## 2012-04-01 LAB — BARTONELLA ANTIBODY PANEL
B henselae IgG: NEGATIVE
B henselae IgM: NEGATIVE

## 2012-04-01 LAB — EPSTEIN-BARR VIRUS VCA ANTIBODY PANEL: EBV VCA IgG: 644 U/mL — ABNORMAL HIGH (ref <18.0)

## 2012-04-02 ENCOUNTER — Telehealth: Payer: Self-pay | Admitting: Emergency Medicine

## 2012-04-02 ENCOUNTER — Other Ambulatory Visit: Payer: Self-pay | Admitting: Emergency Medicine

## 2012-04-02 DIAGNOSIS — R079 Chest pain, unspecified: Secondary | ICD-10-CM

## 2012-04-02 NOTE — Telephone Encounter (Signed)
In review. Do not take alleve or advil. Take prilosec 20 one a day. Zantac 150 twice a day.Referral in process with cardiology.

## 2012-04-02 NOTE — Telephone Encounter (Signed)
Patient states she has pain in the center of her chest. It hurts to push in this area. It hurts for her to take a full breath in. She does not feel short of breath but is coughing up a yellowish green-type material. She was seen in the emergency room a month ago and workup for coronary disease was negative. I will go ahead and make a referral to cardiologist and get them to check this. I suspect her chest pain is somehow related to her illness again with his significant lymphadenopathy. Her sedimentation rate we checked before was 24 and her EKG was normal previously as was her chest x-ray.

## 2012-04-03 ENCOUNTER — Other Ambulatory Visit: Payer: Self-pay

## 2012-04-03 ENCOUNTER — Emergency Department (HOSPITAL_COMMUNITY): Payer: 59

## 2012-04-03 ENCOUNTER — Emergency Department (HOSPITAL_COMMUNITY)
Admission: EM | Admit: 2012-04-03 | Discharge: 2012-04-03 | Disposition: A | Discharge status: Home / Self Care | Payer: 59 | Attending: Emergency Medicine | Admitting: Emergency Medicine

## 2012-04-03 DIAGNOSIS — R109 Unspecified abdominal pain: Secondary | ICD-10-CM | POA: Insufficient documentation

## 2012-04-03 DIAGNOSIS — Z8669 Personal history of other diseases of the nervous system and sense organs: Secondary | ICD-10-CM | POA: Insufficient documentation

## 2012-04-03 DIAGNOSIS — F410 Panic disorder [episodic paroxysmal anxiety] without agoraphobia: Secondary | ICD-10-CM | POA: Insufficient documentation

## 2012-04-03 DIAGNOSIS — Z9889 Other specified postprocedural states: Secondary | ICD-10-CM | POA: Insufficient documentation

## 2012-04-03 DIAGNOSIS — Z79899 Other long term (current) drug therapy: Secondary | ICD-10-CM | POA: Insufficient documentation

## 2012-04-03 DIAGNOSIS — F329 Major depressive disorder, single episode, unspecified: Secondary | ICD-10-CM | POA: Insufficient documentation

## 2012-04-03 DIAGNOSIS — F319 Bipolar disorder, unspecified: Secondary | ICD-10-CM | POA: Insufficient documentation

## 2012-04-03 DIAGNOSIS — Z87891 Personal history of nicotine dependence: Secondary | ICD-10-CM | POA: Insufficient documentation

## 2012-04-03 DIAGNOSIS — F3289 Other specified depressive episodes: Secondary | ICD-10-CM | POA: Insufficient documentation

## 2012-04-03 LAB — CBC
HCT: 37.3 % (ref 36.0–46.0)
Hemoglobin: 12.6 g/dL (ref 12.0–15.0)
MCV: 85.9 fL (ref 78.0–100.0)
RBC: 4.34 MIL/uL (ref 3.87–5.11)
WBC: 8.4 10*3/uL (ref 4.0–10.5)

## 2012-04-03 LAB — HEPATIC FUNCTION PANEL
ALT: 9 U/L (ref 0–35)
AST: 19 U/L (ref 0–37)
Bilirubin, Direct: <0.1 mg/dL (ref 0.0–0.3)
Total Protein: 7.5 g/dL (ref 6.0–8.3)

## 2012-04-03 LAB — BASIC METABOLIC PANEL
CO2: 24 mEq/L (ref 19–32)
Chloride: 102 mEq/L (ref 96–112)
GFR calc Af Amer: >90 mL/min (ref >90)
Potassium: 3.8 mEq/L (ref 3.5–5.1)
Sodium: 138 mEq/L (ref 135–145)

## 2012-04-03 LAB — POCT I-STAT TROPONIN I

## 2012-04-03 LAB — LIPASE, BLOOD: Lipase: 25 U/L (ref 11–59)

## 2012-04-03 MED ORDER — OXYCODONE-ACETAMINOPHEN 5-325 MG PO TABS
2.0000 | ORAL_TABLET | Freq: Once | ORAL | Status: AC
  Administered 2012-04-03: 2 via ORAL
  Filled 2012-04-03: qty 2

## 2012-04-03 MED ORDER — PANTOPRAZOLE SODIUM 40 MG IV SOLR
40.0000 mg | Freq: Once | INTRAVENOUS | Status: AC
  Administered 2012-04-03: 40 mg via INTRAVENOUS
  Filled 2012-04-03: qty 40

## 2012-04-03 MED ORDER — GI COCKTAIL ~~LOC~~
30.0000 mL | Freq: Once | ORAL | Status: AC
  Administered 2012-04-03: 30 mL via ORAL
  Filled 2012-04-03: qty 30

## 2012-04-03 MED ORDER — MORPHINE SULFATE 4 MG/ML IJ SOLN
4.0000 mg | Freq: Once | INTRAMUSCULAR | Status: AC
  Administered 2012-04-03: 4 mg via INTRAVENOUS
  Filled 2012-04-03: qty 1

## 2012-04-03 MED ORDER — ONDANSETRON HCL 4 MG/2ML IJ SOLN
4.0000 mg | Freq: Once | INTRAMUSCULAR | Status: AC
  Administered 2012-04-03: 4 mg via INTRAVENOUS
  Filled 2012-04-03: qty 2

## 2012-04-03 MED ORDER — FAMOTIDINE IN NACL 20-0.9 MG/50ML-% IV SOLN
20.0000 mg | Freq: Once | INTRAVENOUS | Status: AC
  Administered 2012-04-03: 20 mg via INTRAVENOUS
  Filled 2012-04-03: qty 50

## 2012-04-03 MED ORDER — RANITIDINE HCL 150 MG PO TABS: 150.0000 mg | ORAL_TABLET | Freq: Two times a day (BID) | ORAL | Status: DC

## 2012-04-03 NOTE — ED Provider Notes (Signed)
History     CSN: [id number]  Arrival date & time 04/03/12  0047   First MD Initiated Contact with Patient 04/03/12 0340      No chief complaint on file.   (Consider location/radiation/quality/duration/timing/severity/associated sxs/prior treatment) HPI History provided by pt.   Pt c/o severe, diffuse, non-radiating, chest pressure x 2 days.  Radiates into mid-upper back.  Associated w/ 2 episodes of vomiting this evening as well as intermittent SOB.  Denies fever, cough, abdominal pain, LE edema/pain.   Pain increases when she swallows food and with deep inspiration and is not alleviated by tums and eating cheese.  Denies trauma.  FH early MI.  No RF for PE.   Past Medical History  Diagnosis Date  . Anxiety   . Deliberate self-cutting   . Panic attack   . Bipolar 1 disorder   . Allergy   . Depression   . Neuromuscular disorder     Past Surgical History  Procedure Date  . Cesarean section   . Fracture surgery   . Tonsilectomy, adenoidectomy, bilateral myringotomy and tubes     Family History  Problem Relation Age of Onset  . Adopted: Yes  . Bipolar disorder Brother     History  Substance Use Topics  . Smoking status: Former Games developer  . Smokeless tobacco: Never Used  . Alcohol Use: Yes     Comment: socially    OB History    Grav Para Term Preterm Abortions TAB SAB Ect Mult Living                  Review of Systems  All other systems reviewed and are negative.    Allergies  Vicodin  Home Medications   Current Outpatient Rx  Name  Route  Sig  Dispense  Refill  . CLINDAMYCIN HCL 300 MG PO CAPS   Oral   Take 1 capsule (300 mg total) by mouth 3 (three) times daily.   30 capsule   0   . CLONAZEPAM 2 MG PO TABS   Oral   Take 2 mg by mouth 3 (three) times daily as needed. For anxiety         . QUETIAPINE FUMARATE 50 MG PO TABS   Oral   Take 1 tablet (50 mg total) by mouth at bedtime. To begin 02/27/12 or after   30 tablet   1   . SERTRALINE HCL 100  MG PO TABS   Oral   Take 150 mg by mouth at bedtime.         . TRAZODONE HCL 100 MG PO TABS   Oral   Take 100 mg by mouth at bedtime.           BP 122/75  Resp 21  Physical Exam  Nursing note and vitals reviewed. Constitutional: She is oriented to person, place, and time. She appears well-developed and well-nourished. No distress.       Pt does not appear uncomfortable  HENT:  Head: Normocephalic and atraumatic.  Eyes:       Normal appearance  Neck: Normal range of motion.  Cardiovascular: Normal rate, regular rhythm and intact distal pulses.   Pulmonary/Chest: Effort normal and breath sounds normal. No respiratory distress.       Pleuritic pain reported.  Diffuse, mild ttp.    Abdominal: Soft. Bowel sounds are normal. She exhibits no distension. There is no guarding.       Mod-sev epigastric and RUQ ttp  Genitourinary:  No CVA ttp  Musculoskeletal: Normal range of motion.       No peripheral edema or calf tenderness  Neurological: She is alert and oriented to person, place, and time.  Skin: Skin is warm and dry. No rash noted.  Psychiatric: She has a normal mood and affect. Her behavior is normal.    ED Course  Procedures (including critical care time)  Labs Reviewed  HEPATIC FUNCTION PANEL - Abnormal; Notable for the following:    Total Bilirubin 0.2 (*)     All other components within normal limits  BASIC METABOLIC PANEL  CBC  POCT I-STAT TROPONIN I  LIPASE, BLOOD   Dg Chest 2 View  04/03/2012  *RADIOLOGY REPORT*  Clinical Data: Chest pain and left arm numbness.  CHEST - 2 VIEW  Comparison: PA and lateral chest 02/29/2012.  Findings: Lungs clear.  No pneumothorax or pleural fluid.  Heart size normal.  IMPRESSION: Negative chest.   Original Report Authenticated By: Holley Dexter, M.D.    US Abdomen Complete  04/03/2012  *RADIOLOGY REPORT*  Clinical Data:  Chest pain with nausea and vomiting.  COMPLETE ABDOMINAL ULTRASOUND  Comparison:  None.  Findings:   Gallbladder:  No gallstones, wall thickening or sonographic Murphy's sign.  Common bile duct:  Measures 3 mm, within normal limits.  Liver:  No focal lesion identified.  Within normal limits in parenchymal echogenicity.  IVC:  Appears normal.  Pancreas:  No focal abnormality seen.  Spleen:  Measures 9.0 cm, negative.  Right Kidney:  Measures 10.7 cm.  Parenchymal echogenicity is normal.  No hydronephrosis.  No focal lesions.  Left Kidney:  Measures 10.7 cm.  Parenchymal echogenicity is normal.  No hydronephrosis.  No focal lesions.  Abdominal aorta:  The distal abdominal aorta is obscured by bowel gas.  Otherwise, no aneurysm.  IMPRESSION: Negative.   Original Report Authenticated By: Leanna Battles, M.D.      1. Abdominal pain       MDM  27yo F presents w/ c/o non-traumatic CP.  Second visit for same.  Doubt ACS; TIMI 0, pain atypical and is reproducible w/ palpation of chest, RUQ and epigastrium on exam, EKG non-ischemic.  CXR neg.   Suspect pleurisy vs. Esophagitis/gastritis vs. Cholelithiasis.  Pt receiving IV protonix and pepcid + GI cocktail.  Will reassess shortly.  4:32 AM   Pt reports no improvement in pain.  Hepatic function panel, lipase and US abdomen ordered.  Pt to receive IV morphine.  Szekalski, PA-C to dispo.         Otilio Miu, PA-C 04/03/12 1338

## 2012-04-03 NOTE — ED Provider Notes (Addendum)
27 year old female with significant history of bipolar disorder and severe anxiety who takes 2 mg of Klonopin 3 times a day who presents with complaint of chest pain. This chest pain is located in the epigastrium and the mid chest, is a heavy feeling that has been persistent for greater than 48 hours. Nothing seems to make it better or worse, it is not related to exertion or position or eating. She admits to several episodes of nausea and vomiting and has been seen by her family physician including an EKG which was normal. She is adopted and is unaware of her family history for cardiac disease.  On exam the patient has mild diffuse tenderness of her chest, she states this is not similar to her pain, her abdomen has tenderness in the epigastrium but no guarding or peritoneal signs. She did not appear to be in any distress, initial labs and EKG are unremarkable, lipase added.  Labs reviewed, lipase and liver function normal, ultrasound shows no signs of cholecystitis, pancreatitis or renal dysfunction or other intra-abdominal pathology. Her symptoms have improved significantly after medications, she will be discharged on Zantac and followup with family Dr.  Medical screening examination/treatment/procedure(s) were conducted as a shared visit with non-physician practitioner(s) and myself.  I personally evaluated the patient during the encounter    Vida Roller, MD 04/03/12 1610  Vida Roller, MD 04/03/12 [phone]

## 2012-04-03 NOTE — ED Notes (Signed)
See paper chart for downtime charting 

## 2012-04-03 NOTE — ED Provider Notes (Signed)
6:19 AM Patient signed out to me by Ruby Cola, PA-C. Patient is pending a RUQ [place].  7:41 AM Ultrasound negative. Patient will be discharged with NSAIDs for pleurisy. Vitals stable. No further evaluation needed at this time.   Emilia Beck, [place] 04/03/12 [phone]

## 2012-04-03 NOTE — ED Notes (Signed)
Pt. States she vommited yellow and brown substance. Requesting ice chips.

## 2012-04-04 NOTE — ED Provider Notes (Signed)
  Medical screening examination/treatment/procedure(s) were performed by non-physician practitioner and as supervising physician I was immediately available for consultation/collaboration.    Vida Roller, MD 04/04/12 Marlyne Beards

## 2012-04-04 NOTE — ED Provider Notes (Signed)
  Medical screening examination/treatment/procedure(s) were conducted as a shared visit with non-physician practitioner(s) and myself.  I personally evaluated the patient during the encounter  Please see my separate respective documentation pertaining to this patient encounter   Vida Roller, MD 04/04/12 0002

## 2012-04-05 ENCOUNTER — Telehealth: Payer: Self-pay | Admitting: Radiology

## 2012-04-05 ENCOUNTER — Ambulatory Visit (INDEPENDENT_AMBULATORY_CARE_PROVIDER_SITE_OTHER): Payer: 59 | Admitting: Emergency Medicine

## 2012-04-05 VITALS — BP 94/61 | HR 78 | Temp 97.9°F | Resp 18 | Ht 65.0 in | Wt 122.2 lb

## 2012-04-05 DIAGNOSIS — R0981 Nasal congestion: Secondary | ICD-10-CM

## 2012-04-05 DIAGNOSIS — J3489 Other specified disorders of nose and nasal sinuses: Secondary | ICD-10-CM

## 2012-04-05 DIAGNOSIS — R05 Cough: Secondary | ICD-10-CM

## 2012-04-05 DIAGNOSIS — R350 Frequency of micturition: Secondary | ICD-10-CM

## 2012-04-05 DIAGNOSIS — R591 Generalized enlarged lymph nodes: Secondary | ICD-10-CM

## 2012-04-05 DIAGNOSIS — R059 Cough, unspecified: Secondary | ICD-10-CM

## 2012-04-05 DIAGNOSIS — R599 Enlarged lymph nodes, unspecified: Secondary | ICD-10-CM

## 2012-04-05 LAB — POCT UA - MICROSCOPIC ONLY: Mucus, UA: NEGATIVE

## 2012-04-05 LAB — POCT URINALYSIS DIPSTICK
Bilirubin, UA: NEGATIVE
Glucose, UA: NEGATIVE
Ketones, UA: NEGATIVE
Spec Grav, UA: 1.02

## 2012-04-05 LAB — POCT CBC
MCH, POC: 28.1 pg (ref 27–31.2)
MCHC: 31.1 g/dL — AB (ref 31.8–35.4)
MID (cbc): 0.6 (ref 0–0.9)
MPV: 8.1 fL (ref 0–99.8)
POC MID %: 5.5 %M (ref 0–12)
Platelet Count, POC: 337 10*3/uL (ref 142–424)
RBC: 4.59 M/uL (ref 4.04–5.48)
RDW, POC: 13.6 %
WBC: 10.2 10*3/uL (ref 4.6–10.2)

## 2012-04-05 MED ORDER — FLUTICASONE PROPIONATE 50 MCG/ACT NA SUSP: 2.0000 | Freq: Every day | NASAL | Status: DC

## 2012-04-05 NOTE — Telephone Encounter (Signed)
I called patient to  let her now about referral for the cardiologist, she is coming in today to see you. States her throat pain is getting worse.   FYI

## 2012-04-05 NOTE — Progress Notes (Signed)
Subjective:    Patient ID: Allison Richard, female    DOB: 1985/08/15, 27 y.o.   MRN: [id number]  HPI pt complaining of sore throat. She has nasal congestion. States she feels "horrible". She has a cough. Her chest pain sent her to the ED Tuesday night. They ran all kinds of tests but still had no answers for her. History is quite complicated. Patient was seen with significant cervical adenopathy. This was confirmed by CT. Testing done for CMV and EBV were negative. She did complain of significant chest discomfort and soreness in her costochondral area. She was initially placed on Cleocin because her cat was so high. Followup CBC had returned to normal. On Tuesday evening she had significant chest pain and presented to the emergency room where workup was negative. She went to see her new bipolar physician Dr. Tomasa Rand yesterday medications were all changed. Patient does complain of some urinary urgency and frequency and is concerned about this.   Review of Systems     Objective:   Physical Exam patient is alert and cooperative. She does have rhinorrhea noted. Her TMs are clear. Her throat is normal. She's had a tonsillectomy. She does have tender anterior cervical nodes her chest is clear to auscultation and percussion. Her cardiac exam is unremarkable. Her abdomen is soft without splenomegaly  Results for orders placed in visit on 04/05/12  POCT CBC      Component Value Range   WBC 10.2  4.6 - 10.2 K/uL   Lymph, poc 1.8  0.6 - 3.4   POC LYMPH PERCENT 18.1  10 - 50 %L   MID (cbc) 0.6  0 - 0.9   POC MID % 5.5  0 - 12 %M   POC Granulocyte 7.8 (*) 2 - 6.9   Granulocyte percent 76.4  37 - 80 %G   RBC 4.59  4.04 - 5.48 M/uL   Hemoglobin 12.9  12.2 - 16.2 g/dL   HCT, POC 40.9  81.1 - 47.9 %   MCV 90.5  80 - 97 fL   MCH, POC 28.1  27 - 31.2 pg   MCHC 31.1 (*) 31.8 - 35.4 g/dL   RDW, POC 91.4     Platelet Count, POC 337  142 - 424 K/uL   MPV 8.1  0 - 99.8 fL  POCT INFLUENZA A/B   Component Value Range   Influenza A, POC Negative     Influenza B, POC Negative     Results for orders placed in visit on 04/05/12  POCT CBC      Component Value Range   WBC 10.2  4.6 - 10.2 K/uL   Lymph, poc 1.8  0.6 - 3.4   POC LYMPH PERCENT 18.1  10 - 50 %L   MID (cbc) 0.6  0 - 0.9   POC MID % 5.5  0 - 12 %M   POC Granulocyte 7.8 (*) 2 - 6.9   Granulocyte percent 76.4  37 - 80 %G   RBC 4.59  4.04 - 5.48 M/uL   Hemoglobin 12.9  12.2 - 16.2 g/dL   HCT, POC 78.2  95.6 - 47.9 %   MCV 90.5  80 - 97 fL   MCH, POC 28.1  27 - 31.2 pg   MCHC 31.1 (*) 31.8 - 35.4 g/dL   RDW, POC 21.3     Platelet Count, POC 337  142 - 424 K/uL   MPV 8.1  0 - 99.8 fL  POCT INFLUENZA A/B  Component Value Range   Influenza A, POC Negative     Influenza B, POC Negative    POCT URINALYSIS DIPSTICK      Component Value Range   Color, UA yellow     Clarity, UA clear     Glucose, UA neg     Bilirubin, UA neg     Ketones, UA neg     Spec Grav, UA 1.020     Blood, UA neg     pH, UA 7.5     Protein, UA neg     Urobilinogen, UA 0.2     Nitrite, UA neg     Leukocytes, UA Negative    POCT UA - MICROSCOPIC ONLY      Component Value Range   WBC, Ur, HPF, POC 0-2     RBC, urine, microscopic 0-1     Bacteria, U Microscopic neg     Mucus, UA neg     Epithelial cells, urine per micros 0-1     Crystals, Ur, HPF, POC neg     Casts, Ur, LPF, POC neg     Yeast, UA neg    POCT URINE PREGNANCY      Component Value Range   Preg Test, Ur Negative         Assessment & Plan:  We'll check CBC and flu test. This appears to be some type of viral type illness even though her initial count was elevated. She now has more upper respiratory type symptoms . We'll also check a UA for her urinary symptoms.Urine check normal

## 2012-04-22 ENCOUNTER — Ambulatory Visit: Payer: 59

## 2012-05-14 ENCOUNTER — Ambulatory Visit (INDEPENDENT_AMBULATORY_CARE_PROVIDER_SITE_OTHER): Payer: 59 | Admitting: Emergency Medicine

## 2012-05-14 VITALS — BP 106/67 | HR 64 | Temp 97.9°F | Resp 16 | Ht 64.5 in | Wt 122.0 lb

## 2012-05-14 DIAGNOSIS — R591 Generalized enlarged lymph nodes: Secondary | ICD-10-CM

## 2012-05-14 DIAGNOSIS — R599 Enlarged lymph nodes, unspecified: Secondary | ICD-10-CM

## 2012-05-14 DIAGNOSIS — M940 Chondrocostal junction syndrome [Tietze]: Secondary | ICD-10-CM

## 2012-05-14 MED ORDER — AZITHROMYCIN 250 MG PO TABS: ORAL_TABLET | ORAL | Status: DC

## 2012-05-14 NOTE — Patient Instructions (Addendum)
Lymphadenopathy  Lymphadenopathy means "disease of the lymph glands." But the term is usually used to describe swollen or enlarged lymph glands, also called lymph nodes. These are the bean-shaped organs found in many locations including the neck, underarm, and groin. Lymph glands are part of the immune system, which fights infections in your body. Lymphadenopathy can occur in just one area of the body, such as the neck, or it can be generalized, with lymph node enlargement in several areas. The nodes found in the neck are the most common sites of lymphadenopathy.  CAUSES    When your immune system responds to germs (such as viruses or bacteria ), infection-fighting cells and fluid build up. This causes the glands to grow in size. This is usually not something to worry about. Sometimes, the glands themselves can become infected and inflamed. This is called lymphadenitis.  Enlarged lymph nodes can be caused by many diseases:   Bacterial disease, such as strep throat or a skin infection.   Viral disease, such as a common cold.   Other germs, such as lyme disease, tuberculosis, or sexually transmitted diseases.   Cancers, such as lymphoma (cancer of the lymphatic system) or leukemia (cancer of the white blood cells).   Inflammatory diseases such as lupus or rheumatoid arthritis.   Reactions to medications.  Many of the diseases above are rare, but important. This is why you should see your caregiver if you have lymphadenopathy.  SYMPTOMS     Swollen, enlarged lumps in the neck, back of the head or other locations.   Tenderness.   Warmth or redness of the skin over the lymph nodes.   Fever.  DIAGNOSIS   Enlarged lymph nodes are often near the source of infection. They can help healthcare providers diagnose your illness. For instance:     Swollen lymph nodes around the jaw might be caused by an infection in the mouth.   Enlarged glands in the neck often signal a throat infection.    Lymph nodes that are swollen in more than one area often indicate an illness caused by a virus.  Your caregiver most likely will know what is causing your lymphadenopathy after listening to your history and examining you. Blood tests, x-rays or other tests may be needed. If the cause of the enlarged lymph node cannot be found, and it does not go away by itself, then a biopsy may be needed. Your caregiver will discuss this with you.  TREATMENT    Treatment for your enlarged lymph nodes will depend on the cause. Many times the nodes will shrink to normal size by themselves, with no treatment. Antibiotics or other medicines may be needed for infection. Only take over-the-counter or prescription medicines for pain, discomfort or fever as directed by your caregiver.  HOME CARE INSTRUCTIONS    Swollen lymph glands usually return to normal when the underlying medical condition goes away. If they persist, contact your health-care provider. He/she might prescribe antibiotics or other treatments, depending on the diagnosis. Take any medications exactly as prescribed. Keep any follow-up appointments made to check on the condition of your enlarged nodes.    SEEK MEDICAL CARE IF:     Swelling lasts for more than two weeks.   You have symptoms such as weight loss, night sweats, fatigue or fever that does not go away.   The lymph nodes are hard, seem fixed to the skin or are growing rapidly.   Skin over the lymph nodes is red and inflamed. This   could mean there is an infection.  SEEK IMMEDIATE MEDICAL CARE IF:     Fluid starts leaking from the area of the enlarged lymph node.   You develop a fever of 102 F (38.9 C) or greater.   Severe pain develops (not necessarily at the site of a large lymph node).   You develop chest pain or shortness of breath.   You develop worsening abdominal pain.  MAKE SURE YOU:     Understand these instructions.   Will watch your condition.    Will get help right away if you are not doing well or get worse.  Document Released: 12/21/2007 Document Revised: 06/05/2011 Document Reviewed: 12/21/2007  [employer] Patient Information 2013 [employer].

## 2012-05-14 NOTE — Progress Notes (Signed)
[facility] [address], [place] [address] [phone]  Date:  05/14/2012   Name:  Allison Richard   DOB:  02/24/1986   MRN:  [id number]  PCP:  Tonye Pearson, MD    Chief Complaint: Adenopathy   History of Present Illness:  Allison Richard is a 27 y.o. very pleasant female patient who presents with the following:  Reviewed her labs, CT and prior office visit notes.  Continues to have bilateral lymphadenopathy in her neck  Now has a new right anterior chest wall mass that is tender.  She has no fever or chills, no coryza no cough or nausea or vomiting.    Patient Active Problem List  Diagnosis  . Lip laceration  . Open wound of left upper arm  . Depression with anxiety  . Acne  . Insomnia    Past Medical History  Diagnosis Date  . Anxiety   . Deliberate self-cutting   . Panic attack   . Bipolar 1 disorder   . Allergy   . Depression   . Neuromuscular disorder     Past Surgical History  Procedure Laterality Date  . Cesarean section    . Fracture surgery    . Tonsilectomy, adenoidectomy, bilateral myringotomy and tubes      History  Substance Use Topics  . Smoking status: Former Games developer  . Smokeless tobacco: Never Used  . Alcohol Use: Yes     Comment: socially    Family History  Problem Relation Age of Onset  . Adopted: Yes  . Bipolar disorder Brother     Allergies  Allergen Reactions  . Vicodin (Hydrocodone-Acetaminophen) Itching    Bad dreams    Medication list has been reviewed and updated.  Current Outpatient Prescriptions on File Prior to Visit  Medication Sig Dispense Refill  . ALPRAZolam (XANAX) 1 MG tablet Take 1 mg by mouth at bedtime as needed.      . divalproex (DEPAKOTE ER) 500 MG 24 hr tablet Take 500 mg by mouth daily.      . fluticasone (FLONASE) 50 MCG/ACT nasal spray Place 2 sprays into the nose daily.  16 g  6  . lamoTRIgine (LAMICTAL) 25 MG tablet Take 25 mg by mouth daily.      . traZODone  (DESYREL) 100 MG tablet Take 100 mg by mouth at bedtime.      . clindamycin (CLEOCIN) 300 MG capsule Take 1 capsule (300 mg total) by mouth 3 (three) times daily.  30 capsule  0  . clonazePAM (KLONOPIN) 2 MG tablet Take 2 mg by mouth 3 (three) times daily as needed. For anxiety      . QUEtiapine (SEROQUEL) 50 MG tablet Take 1 tablet (50 mg total) by mouth at bedtime. To begin 02/27/12 or after  30 tablet  1  . ranitidine (ZANTAC) 150 MG tablet Take 1 tablet (150 mg total) by mouth 2 (two) times daily.  60 tablet  0  . sertraline (ZOLOFT) 100 MG tablet Take 150 mg by mouth at bedtime.       No current facility-administered medications on file prior to visit.    Review of Systems:  As per HPI, otherwise negative.    Physical Examination: Filed Vitals:   05/14/12 1349  BP: 106/67  Pulse: 64  Temp: 97.9 F (36.6 C)  Resp: 16   Filed Vitals:   05/14/12 1349  Height: 5' 4.5" (1.638 m)  Weight: 122 lb (55.339 kg)   Body  mass index is 20.63 kg/(m^2). Ideal Body Weight: Weight in (lb) to have BMI = 25: 147.6  GEN: WDWN, NAD, Non-toxic, A & O x 3 HEENT: Atraumatic, Normocephalic. Neck supple. No masses, she has one large mobile tender node on the right anterior cervical chain and a smaller node on the left.. Ears and Nose: No external deformity. CV: RRR, No M/G/R. No JVD. No thrill. No extra heart sounds. PULM: CTA B, no wheezes, crackles, rhonchi. No retractions. No resp. distress. No accessory muscle use. CHEST:  Tender costochondral joint on right at 4th rib.  No crepitus.  No mass. Reproduces pain in her chest ABD: S, NT, ND, +BS. No rebound. No HSM. EXTR: No c/c/e NEURO Normal gait.  PSYCH: Normally interactive. Conversant. Not depressed or anxious appearing.  Calm demeanor.    Assessment and Plan: Costochondritis Anaprox Cervical lymphadenopathy zpak Follow up with ENT if not improved  Carmelina Dane, MD

## 2012-11-16 ENCOUNTER — Ambulatory Visit: Payer: 59

## 2012-11-16 ENCOUNTER — Ambulatory Visit (INDEPENDENT_AMBULATORY_CARE_PROVIDER_SITE_OTHER): Payer: 59 | Admitting: Internal Medicine

## 2012-11-16 VITALS — BP 122/70 | HR 83 | Temp 98.1°F | Resp 16 | Ht 64.0 in | Wt 131.4 lb

## 2012-11-16 DIAGNOSIS — G4700 Insomnia, unspecified: Secondary | ICD-10-CM

## 2012-11-16 DIAGNOSIS — M545 Low back pain: Secondary | ICD-10-CM

## 2012-11-16 DIAGNOSIS — F411 Generalized anxiety disorder: Secondary | ICD-10-CM

## 2012-11-16 MED ORDER — CYCLOBENZAPRINE HCL 10 MG PO TABS: 10.0000 mg | ORAL_TABLET | Freq: Every day | ORAL | Status: DC

## 2012-11-16 MED ORDER — QUETIAPINE FUMARATE 50 MG PO TABS: 50.0000 mg | ORAL_TABLET | Freq: Every day | ORAL | Status: DC

## 2012-11-16 MED ORDER — MELOXICAM 15 MG PO TABS: 15.0000 mg | ORAL_TABLET | Freq: Every day | ORAL | Status: DC

## 2012-11-16 MED ORDER — ESCITALOPRAM OXALATE 20 MG PO TABS: 20.0000 mg | ORAL_TABLET | Freq: Every day | ORAL | Status: DC

## 2012-11-16 MED ORDER — CLONAZEPAM 2 MG PO TABS: 2.0000 mg | ORAL_TABLET | Freq: Every day | ORAL | Status: DC | PRN
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
## 2012-12-09 ENCOUNTER — Telehealth: Payer: Self-pay

## 2012-12-09 NOTE — Telephone Encounter (Signed)
Pt wants dr Merla Riches to know that the meds that she was put on is doing great she feels wonderful Call back number is [phone]

## 2013-01-30 ENCOUNTER — Other Ambulatory Visit: Payer: Self-pay

## 2013-02-04 ENCOUNTER — Telehealth: Payer: Self-pay

## 2013-02-04 NOTE — Telephone Encounter (Signed)
Patient of Dr Merla Riches. Needs refills on lexapro, seroquel, and klonopin. She is concerned because Dr Merla Riches is out of the office and is unsure if she has to wait until then or not. CB# [phone].

## 2013-02-05 ENCOUNTER — Telehealth: Payer: Self-pay | Admitting: *Deleted

## 2013-02-05 MED ORDER — QUETIAPINE FUMARATE 50 MG PO TABS: 50.0000 mg | ORAL_TABLET | Freq: Every day | ORAL | Status: DC

## 2013-02-05 MED ORDER — CLONAZEPAM 2 MG PO TABS: 2.0000 mg | ORAL_TABLET | Freq: Every day | ORAL | Status: DC | PRN

## 2013-02-05 MED ORDER — ESCITALOPRAM OXALATE 20 MG PO TABS: 20.0000 mg | ORAL_TABLET | Freq: Every day | ORAL | Status: DC

## 2013-02-05 NOTE — Telephone Encounter (Signed)
ssuming she is stable//can refill--she was su[pposed to rtc in sept but called instead We need to have f/u set up at 104  Meds ordered this encounter  Medications  . QUEtiapine (SEROQUEL) 50 MG tablet    Sig: Take 1 tablet (50 mg total) by mouth at bedtime.    Dispense:  30 tablet    Refill:  2  . escitalopram (LEXAPRO) 20 MG tablet    Sig: Take 1 tablet (20 mg total) by mouth daily.    Dispense:  30 tablet    Refill:  2  . clonazePAM (KLONOPIN) 2 MG tablet    Sig: Take 1 tablet (2 mg total) by mouth daily as needed. For anxiety    Dispense:  30 tablet    Refill:  2

## 2013-02-05 NOTE — Telephone Encounter (Signed)
Left msg for patient, Rx from Dr. Merla Riches is ready to pickup at 102.  Marland Kitchen

## 2013-03-27 DIAGNOSIS — Z87898 Personal history of other specified conditions: Secondary | ICD-10-CM

## 2013-03-27 HISTORY — DX: Personal history of other specified conditions: Z87.898

## 2013-05-05 ENCOUNTER — Ambulatory Visit (INDEPENDENT_AMBULATORY_CARE_PROVIDER_SITE_OTHER): Payer: 59 | Admitting: Internal Medicine

## 2013-05-05 VITALS — BP 106/70 | HR 88 | Temp 97.9°F | Resp 16 | Ht 65.0 in | Wt 130.4 lb

## 2013-05-05 DIAGNOSIS — G4700 Insomnia, unspecified: Secondary | ICD-10-CM

## 2013-05-05 DIAGNOSIS — R300 Dysuria: Secondary | ICD-10-CM

## 2013-05-05 DIAGNOSIS — F418 Other specified anxiety disorders: Secondary | ICD-10-CM

## 2013-05-05 DIAGNOSIS — F411 Generalized anxiety disorder: Secondary | ICD-10-CM

## 2013-05-05 DIAGNOSIS — M549 Dorsalgia, unspecified: Secondary | ICD-10-CM

## 2013-05-05 LAB — POCT URINALYSIS DIPSTICK
Bilirubin, UA: NEGATIVE
Blood, UA: NEGATIVE
Glucose, UA: NEGATIVE
KETONES UA: NEGATIVE
LEUKOCYTES UA: NEGATIVE
Nitrite, UA: NEGATIVE
PH UA: 6.5
PROTEIN UA: NEGATIVE
Spec Grav, UA: 1.025
UROBILINOGEN UA: 0.2

## 2013-05-05 LAB — POCT UA - MICROSCOPIC ONLY
Bacteria, U Microscopic: NEGATIVE
CASTS, UR, LPF, POC: NEGATIVE
Crystals, Ur, HPF, POC: NEGATIVE
Epithelial cells, urine per micros: NEGATIVE
MUCUS UA: NEGATIVE
RBC, urine, microscopic: NEGATIVE
WBC, Ur, HPF, POC: NEGATIVE
YEAST UA: NEGATIVE

## 2013-05-05 MED ORDER — CYCLOBENZAPRINE HCL 10 MG PO TABS: 10.0000 mg | ORAL_TABLET | Freq: Every day | ORAL | Status: DC

## 2013-05-05 MED ORDER — QUETIAPINE FUMARATE 50 MG PO TABS: 50.0000 mg | ORAL_TABLET | Freq: Every day | ORAL | Status: DC

## 2013-05-05 MED ORDER — ESCITALOPRAM OXALATE 20 MG PO TABS: 20.0000 mg | ORAL_TABLET | Freq: Every day | ORAL | Status: DC

## 2013-05-05 MED ORDER — MELOXICAM 15 MG PO TABS: 15.0000 mg | ORAL_TABLET | Freq: Every day | ORAL | Status: DC

## 2013-05-05 MED ORDER — ALPRAZOLAM 0.5 MG PO TABS: ORAL_TABLET | ORAL | Status: DC
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
## 2013-10-10 ENCOUNTER — Ambulatory Visit (INDEPENDENT_AMBULATORY_CARE_PROVIDER_SITE_OTHER): Admitting: Emergency Medicine

## 2013-10-10 VITALS — BP 104/72 | HR 94 | Temp 98.3°F | Resp 18 | Ht 64.5 in | Wt 125.0 lb

## 2013-10-10 DIAGNOSIS — F411 Generalized anxiety disorder: Secondary | ICD-10-CM

## 2013-10-10 DIAGNOSIS — G4700 Insomnia, unspecified: Secondary | ICD-10-CM

## 2013-10-10 MED ORDER — ALPRAZOLAM 1 MG PO TABS: ORAL_TABLET | ORAL | Status: DC

## 2013-10-10 MED ORDER — QUETIAPINE FUMARATE 100 MG PO TABS: 50.0000 mg | ORAL_TABLET | Freq: Every day | ORAL | Status: DC

## 2013-10-10 NOTE — Progress Notes (Signed)
[facility] [address], [place] [id number] [phone]  Date:  10/10/2013   Name:  Allison Richard   DOB:  09/09/1985   MRN:  [id number]  PCP:  Tonye PearsonOLITTLE, ROBERT P, MD    Chief Complaint: Medication Problem   History of Present Illness:  Allison Richard is a 28 y.o. very pleasant female patient who presents with the following:  Patient with a long history of anxiety and insomnia probably with a bit of depression worked in. She has come in tearful that she is having more difficulty sleeping and has exhausted her store of xanax.  Not sleeping.  Daytime anxiety is worse.  Has no motivation to do anything.  Has tachycardia and twitching of eyes.  Moved home and getting on better with father.  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  Wants to be a paramedic.  Denies other complaint or health concern today.   Patient Active Problem List   Diagnosis Date Noted  . Depression with anxiety 12/08/2011  . Acne 12/08/2011  . Insomnia 12/08/2011  . Lip laceration 11/07/2011  . Open wound of left upper arm 11/07/2011    Past Medical History  Diagnosis Date  . Anxiety   . Deliberate self-cutting   . Panic attack   . Bipolar 1 disorder   . Allergy   . Depression   . Neuromuscular disorder     Past Surgical History  Procedure Laterality Date  . Cesarean section    . Fracture surgery    . Tonsilectomy, adenoidectomy, bilateral myringotomy and tubes      History  Substance Use Topics  . Smoking status: Former Games developermoker  . Smokeless tobacco: Never Used  . Alcohol Use: Yes     Comment: socially    Family History  Problem Relation Age of Onset  . Adopted: Yes  . Bipolar disorder Brother     Allergies  Allergen Reactions  . Morphine And Related Hives  . Vicodin [Hydrocodone-Acetaminophen] Itching    Bad dreams    Medication list has been reviewed and updated.  Current Outpatient Prescriptions on File Prior to Visit  Medication Sig Dispense  Refill  . ALPRAZolam (XANAX) 0.5 MG tablet 1 as needed for anxiety attack  30 tablet  5  . cyclobenzaprine (FLEXERIL) 10 MG tablet Take 1 tablet (10 mg total) by mouth at bedtime.  30 tablet  5  . escitalopram (LEXAPRO) 20 MG tablet Take 1 tablet (20 mg total) by mouth daily.  30 tablet  5  . meloxicam (MOBIC) 15 MG tablet Take 1 tablet (15 mg total) by mouth daily. For back pain  30 tablet  5  . QUEtiapine (SEROQUEL) 50 MG tablet Take 1-2 tablets (50-100 mg total) by mouth at bedtime.  60 tablet  5   No current facility-administered medications on file prior to visit.    Review of Systems:  As per HPI, otherwise negative.    Physical Examination: Filed Vitals:   10/10/13 1759  BP: 104/72  Pulse: 94  Temp: 98.3 F (36.8 C)  Resp: 18   Filed Vitals:   10/10/13 1759  Height: 5' 4.5" (1.638 m)  Weight: 125 lb (56.7 kg)   Body mass index is 21.13 kg/(m^2). Ideal Body Weight: Weight in (lb) to have BMI = 25: 147.6   GEN: WDWN, NAD, Non-toxic, Alert & Oriented x 3 HEENT: Atraumatic, Normocephalic.  Ears and Nose: No external deformity. EXTR: No clubbing/cyanosis/edema NEURO: Normal gait.  PSYCH: Normally interactive. Conversant.  Not depressed or anxious appearing.  Calm demeanor.  Tearful at times.  Focused and appears motivated..  Assessment and Plan: Anxiety  Insomnia Refill meds   Signed,  Phillips Odor, MD

## 2013-10-10 NOTE — Patient Instructions (Signed)
Place anxiety patient instructions here.  

## 2013-10-30 ENCOUNTER — Ambulatory Visit (INDEPENDENT_AMBULATORY_CARE_PROVIDER_SITE_OTHER): Admitting: Emergency Medicine

## 2013-10-30 VITALS — BP 110/66 | HR 112 | Temp 98.2°F | Resp 18 | Ht 65.0 in | Wt 129.0 lb

## 2013-10-30 DIAGNOSIS — F411 Generalized anxiety disorder: Secondary | ICD-10-CM

## 2013-10-30 DIAGNOSIS — F418 Other specified anxiety disorders: Secondary | ICD-10-CM

## 2013-10-30 DIAGNOSIS — F341 Dysthymic disorder: Secondary | ICD-10-CM

## 2013-10-30 DIAGNOSIS — G4700 Insomnia, unspecified: Secondary | ICD-10-CM

## 2013-10-30 MED ORDER — PAROXETINE HCL 20 MG PO TABS: 20.0000 mg | ORAL_TABLET | Freq: Every day | ORAL | Status: DC

## 2013-10-30 MED ORDER — ALPRAZOLAM 1 MG PO TABS: 1.0000 mg | ORAL_TABLET | Freq: Three times a day (TID) | ORAL | Status: DC | PRN

## 2013-10-30 NOTE — Patient Instructions (Signed)
Generalized Anxiety Disorder Generalized anxiety disorder (GAD) is a mental disorder. It interferes with life functions, including relationships, work, and school. GAD is different from normal anxiety, which everyone experiences at some point in their lives in response to specific life events and activities. Normal anxiety actually helps us prepare for and get through these life events and activities. Normal anxiety goes away after the event or activity is over.  GAD causes anxiety that is not necessarily related to specific events or activities. It also causes excess anxiety in proportion to specific events or activities. The anxiety associated with GAD is also difficult to control. GAD can vary from mild to severe. People with severe GAD can have intense waves of anxiety with physical symptoms (panic attacks).  SYMPTOMS The anxiety and worry associated with GAD are difficult to control. This anxiety and worry are related to many life events and activities and also occur more days than not for 6 months or longer. People with GAD also have three or more of the following symptoms (one or more in children):  Restlessness.   Fatigue.  Difficulty concentrating.   Irritability.  Muscle tension.  Difficulty sleeping or unsatisfying sleep. DIAGNOSIS GAD is diagnosed through an assessment by your health care provider. Your health care provider will ask you questions aboutyour mood,physical symptoms, and events in your life. Your health care provider may ask you about your medical history and use of alcohol or drugs, including prescription medicines. Your health care provider may also do a physical exam and blood tests. Certain medical conditions and the use of certain substances can cause symptoms similar to those associated with GAD. Your health care provider may refer you to a mental health specialist for further evaluation. TREATMENT The following therapies are usually used to treat GAD:    Medication. Antidepressant medication usually is prescribed for long-term daily control. Antianxiety medicines may be added in severe cases, especially when panic attacks occur.   Talk therapy (psychotherapy). Certain types of talk therapy can be helpful in treating GAD by providing support, education, and guidance. A form of talk therapy called cognitive behavioral therapy can teach you healthy ways to think about and react to daily life events and activities.  Stress managementtechniques. These include yoga, meditation, and exercise and can be very helpful when they are practiced regularly. A mental health specialist can help determine which treatment is best for you. Some people see improvement with one therapy. However, other people require a combination of therapies. Document Released: 07/08/2012 Document Revised: 07/28/2013 Document Reviewed: 07/08/2012 ExitCare Patient Information 2015 [employer]. This information is not intended to replace advice given to you by your health care provider. Make sure you discuss any questions you have with your health care provider.  

## 2013-10-30 NOTE — Progress Notes (Signed)
[facility] [address], [place] [address] [phone]  Date:  10/30/2013   Name:  Allison Richard   DOB:  [birth year]   MRN:  [id number]  PCP:  Tonye Pearson, MD    Chief Complaint: rx refills   History of Present Illness:  Allison Richard is a 28 y.o. very pleasant female patient who presents with the following:  Patient has a history of anxiety and panic disorder.  On the increased seroquel is sleeping better.  Is taking 2-3 xanax a day and that is barely holding her.   No suicidal ideation or thought of harm to others. No improvement with over the counter medications or other home remedies.  Denies other complaint or health concern today.   Patient Active Problem List   Diagnosis Date Noted  . Depression with anxiety 12/08/2011  . Acne 12/08/2011  . Insomnia 12/08/2011  . Lip laceration 11/07/2011  . Open wound of left upper arm 11/07/2011    Past Medical History  Diagnosis Date  . Anxiety   . Deliberate self-cutting   . Panic attack   . Bipolar 1 disorder   . Allergy   . Depression   . Neuromuscular disorder     Past Surgical History  Procedure Laterality Date  . Cesarean section    . Fracture surgery    . Tonsilectomy, adenoidectomy, bilateral myringotomy and tubes      History  Substance Use Topics  . Smoking status: Former Games developer  . Smokeless tobacco: Never Used  . Alcohol Use: Yes     Comment: socially    Family History  Problem Relation Age of Onset  . Adopted: Yes  . Bipolar disorder Brother     Allergies  Allergen Reactions  . Morphine And Related Hives  . Vicodin [Hydrocodone-Acetaminophen] Itching    Bad dreams    Medication list has been reviewed and updated.  Current Outpatient Prescriptions on File Prior to Visit  Medication Sig Dispense Refill  . ALPRAZolam (XANAX) 1 MG tablet 1 as needed for anxiety attack  30 tablet  5  . cyclobenzaprine (FLEXERIL) 10 MG tablet Take 1 tablet (10 mg  total) by mouth at bedtime.  30 tablet  5  . escitalopram (LEXAPRO) 20 MG tablet Take 1 tablet (20 mg total) by mouth daily.  30 tablet  5  . meloxicam (MOBIC) 15 MG tablet Take 1 tablet (15 mg total) by mouth daily. For back pain  30 tablet  5  . QUEtiapine (SEROQUEL) 100 MG tablet Take 0.5-1 tablets (50-100 mg total) by mouth at bedtime.  30 tablet  5   No current facility-administered medications on file prior to visit.    Review of Systems:  As per HPI, otherwise negative.    Physical Examination: Filed Vitals:   10/30/13 1506  BP: 110/66  Pulse: 112  Temp: 98.2 F (36.8 C)  Resp: 18   Filed Vitals:   10/30/13 1506  Height: 5\' 5"  (1.651 m)  Weight: 129 lb (58.514 kg)   Body mass index is 21.47 kg/(m^2). Ideal Body Weight: Weight in (lb) to have BMI = 25: 149.9  GEN: WDWN, NAD, Non-toxic, A & O x 3 HEENT: Atraumatic, Normocephalic. Neck supple. No masses, No LAD. Ears and Nose: No external deformity. CV: RRR, No M/G/R. No JVD. No thrill. No extra heart sounds. PULM: CTA B, no wheezes, crackles, rhonchi. No retractions. No resp. distress. No accessory muscle use. ABD: S, NT, ND, +BS. No rebound.  No HSM. EXTR: No c/c/e NEURO Normal gait.  PSYCH: Normally interactive. Conversant. Not depressed or anxious appearing.  Calm demeanor.    Assessment and Plan: Anxiety Panic disorder Try paxil Follow up in 1 month  Signed,  Phillips OdorJeffery Anderson, MD

## 2013-12-17 ENCOUNTER — Emergency Department (HOSPITAL_COMMUNITY)
Admission: EM | Admit: 2013-12-17 | Discharge: 2013-12-17 | Disposition: A | Discharge status: Home / Self Care | Attending: Emergency Medicine | Admitting: Emergency Medicine

## 2013-12-17 ENCOUNTER — Encounter (HOSPITAL_COMMUNITY): Payer: Self-pay | Admitting: Emergency Medicine

## 2013-12-17 ENCOUNTER — Emergency Department (HOSPITAL_COMMUNITY)

## 2013-12-17 DIAGNOSIS — F3289 Other specified depressive episodes: Secondary | ICD-10-CM | POA: Diagnosis not present

## 2013-12-17 DIAGNOSIS — F329 Major depressive disorder, single episode, unspecified: Secondary | ICD-10-CM | POA: Diagnosis not present

## 2013-12-17 DIAGNOSIS — R000 Tachycardia, unspecified: Secondary | ICD-10-CM | POA: Diagnosis not present

## 2013-12-17 DIAGNOSIS — R569 Unspecified convulsions: Secondary | ICD-10-CM | POA: Diagnosis not present

## 2013-12-17 DIAGNOSIS — Z87828 Personal history of other (healed) physical injury and trauma: Secondary | ICD-10-CM | POA: Insufficient documentation

## 2013-12-17 DIAGNOSIS — Z79899 Other long term (current) drug therapy: Secondary | ICD-10-CM | POA: Insufficient documentation

## 2013-12-17 DIAGNOSIS — Z3202 Encounter for pregnancy test, result negative: Secondary | ICD-10-CM | POA: Diagnosis not present

## 2013-12-17 DIAGNOSIS — F411 Generalized anxiety disorder: Secondary | ICD-10-CM | POA: Diagnosis not present

## 2013-12-17 DIAGNOSIS — R110 Nausea: Secondary | ICD-10-CM | POA: Insufficient documentation

## 2013-12-17 LAB — CBC
HEMATOCRIT: 33.4 % — AB (ref 36.0–46.0)
Hemoglobin: 11.7 g/dL — ABNORMAL LOW (ref 12.0–15.0)
MCH: 29.2 pg (ref 26.0–34.0)
MCHC: 35 g/dL (ref 30.0–36.0)
MCV: 83.3 fL (ref 78.0–100.0)
Platelets: 186 10*3/uL (ref 150–400)
RBC: 4.01 MIL/uL (ref 3.87–5.11)
RDW: 12.2 % (ref 11.5–15.5)
WBC: 7 10*3/uL (ref 4.0–10.5)

## 2013-12-17 LAB — URINALYSIS, ROUTINE W REFLEX MICROSCOPIC
Bilirubin Urine: NEGATIVE
GLUCOSE, UA: 100 mg/dL — AB
Ketones, ur: 40 mg/dL — AB
LEUKOCYTES UA: NEGATIVE
Nitrite: NEGATIVE
Protein, ur: NEGATIVE mg/dL
SPECIFIC GRAVITY, URINE: 1.018 (ref 1.005–1.030)
Urobilinogen, UA: 0.2 mg/dL (ref 0.0–1.0)
pH: 6 (ref 5.0–8.0)

## 2013-12-17 LAB — RAPID URINE DRUG SCREEN, HOSP PERFORMED
AMPHETAMINES: NOT DETECTED
Barbiturates: NOT DETECTED
Benzodiazepines: POSITIVE — AB
COCAINE: NOT DETECTED
Opiates: NOT DETECTED
TETRAHYDROCANNABINOL: NOT DETECTED

## 2013-12-17 LAB — BASIC METABOLIC PANEL
ANION GAP: 12 (ref 5–15)
BUN: 6 mg/dL (ref 6–23)
CHLORIDE: 107 meq/L (ref 96–112)
CO2: 20 meq/L (ref 19–32)
Calcium: 8.2 mg/dL — ABNORMAL LOW (ref 8.4–10.5)
Creatinine, Ser: 0.81 mg/dL (ref 0.50–1.10)
GFR calc Af Amer: >90 mL/min (ref >90)
GFR calc non Af Amer: >90 mL/min (ref >90)
GLUCOSE: 158 mg/dL — AB (ref 70–99)
Potassium: 3.4 mEq/L — ABNORMAL LOW (ref 3.7–5.3)
SODIUM: 139 meq/L (ref 137–147)

## 2013-12-17 LAB — CBG MONITORING, ED: Glucose-Capillary: 159 mg/dL — ABNORMAL HIGH (ref 70–99)

## 2013-12-17 LAB — URINE MICROSCOPIC-ADD ON

## 2013-12-17 LAB — ETHANOL: Alcohol, Ethyl (B): <11 mg/dL (ref 0–11)

## 2013-12-17 LAB — POC URINE PREG, ED: PREG TEST UR: NEGATIVE

## 2013-12-17 MED ORDER — ONDANSETRON HCL 4 MG/2ML IJ SOLN
4.0000 mg | Freq: Once | INTRAMUSCULAR | Status: AC
  Administered 2013-12-17: 4 mg via INTRAVENOUS
  Filled 2013-12-17: qty 2

## 2013-12-17 MED ORDER — LORAZEPAM 2 MG/ML IJ SOLN: 1.0000 mg | Freq: Once | INTRAMUSCULAR | Status: DC

## 2013-12-17 MED ORDER — LORAZEPAM 2 MG/ML IJ SOLN
1.0000 mg | Freq: Once | INTRAMUSCULAR | Status: AC
  Administered 2013-12-17: 1 mg via INTRAVENOUS
  Filled 2013-12-17: qty 1

## 2013-12-17 NOTE — ED Provider Notes (Signed)
Patient care acquired from [place], [place]. Patient with new onset seizures, full HPI, ROS, and PE in [place], [place]. Awaiting Neurology consultation at time of shift change.   Results for orders placed during the hospital encounter of 12/17/13  BASIC METABOLIC PANEL      Result Value Ref Range   Sodium 139  137 - 147 mEq/L   Potassium 3.4 (*) 3.7 - 5.3 mEq/L   Chloride 107  96 - 112 mEq/L   CO2 20  19 - 32 mEq/L   Glucose, Bld 158 (*) 70 - 99 mg/dL   BUN 6  6 - 23 mg/dL   Creatinine, Ser 4.09  0.50 - 1.10 mg/dL   Calcium 8.2 (*) 8.4 - 10.5 mg/dL   GFR calc non Af Amer >90  >90 mL/min   GFR calc Af Amer >90  >90 mL/min   Anion gap 12  5 - 15  CBC      Result Value Ref Range   WBC 7.0  4.0 - 10.5 K/uL   RBC 4.01  3.87 - 5.11 MIL/uL   Hemoglobin 11.7 (*) 12.0 - 15.0 g/dL   HCT 81.1 (*) 91.4 - 78.2 %   MCV 83.3  78.0 - 100.0 fL   MCH 29.2  26.0 - 34.0 pg   MCHC 35.0  30.0 - 36.0 g/dL   RDW 95.6  21.3 - 08.6 %   Platelets 186  150 - 400 K/uL  URINALYSIS, ROUTINE W REFLEX MICROSCOPIC      Result Value Ref Range   Color, Urine YELLOW  YELLOW   APPearance HAZY (*) CLEAR   Specific Gravity, Urine 1.018  1.005 - 1.030   pH 6.0  5.0 - 8.0   Glucose, UA 100 (*) NEGATIVE mg/dL   Hgb urine dipstick TRACE (*) NEGATIVE   Bilirubin Urine NEGATIVE  NEGATIVE   Ketones, ur 40 (*) NEGATIVE mg/dL   Protein, ur NEGATIVE  NEGATIVE mg/dL   Urobilinogen, UA 0.2  0.0 - 1.0 mg/dL   Nitrite NEGATIVE  NEGATIVE   Leukocytes, UA NEGATIVE  NEGATIVE  URINE RAPID DRUG SCREEN (HOSP PERFORMED)      Result Value Ref Range   Opiates NONE DETECTED  NONE DETECTED   Cocaine NONE DETECTED  NONE DETECTED   Benzodiazepines POSITIVE (*) NONE DETECTED   Amphetamines NONE DETECTED  NONE DETECTED   Tetrahydrocannabinol NONE DETECTED  NONE DETECTED   Barbiturates NONE DETECTED  NONE DETECTED  ETHANOL      Result Value Ref Range   Alcohol, Ethyl (B) <11  0 - 11 mg/dL  URINE MICROSCOPIC-ADD ON      Result  Value Ref Range   Squamous Epithelial / LPF FEW (*) RARE   WBC, UA 0-2  <3 WBC/hpf   RBC / HPF 0-2  <3 RBC/hpf   Bacteria, UA FEW (*) RARE   Urine-Other MUCOUS PRESENT    CBG MONITORING, ED      Result Value Ref Range   Glucose-Capillary 159 (*) 70 - 99 mg/dL   Comment 1 Notify RN    POC URINE PREG, ED      Result Value Ref Range   Preg Test, Ur NEGATIVE  NEGATIVE   Ct Head Wo Contrast  12/17/2013   CLINICAL DATA:  Witnessed seizure  EXAM: CT HEAD WITHOUT CONTRAST  TECHNIQUE: Contiguous axial images were obtained from the base of the skull through the vertex without intravenous contrast.  COMPARISON:  None  FINDINGS: Beam hardening artifacts from jewelry  at LEFT ear.  Normal ventricular morphology.  No midline shift or mass effect.  Grossly normal appearance of brain parenchyma.  No intracranial hemorrhage, mass lesion or evidence acute infarction.  No extra-axial fluid collections.  Nasal septal deviation to the RIGHT.  Bones and sinuses unremarkable.  IMPRESSION: No definite acute intracranial abnormalities.   Electronically Signed   By: Ulyses Southward M.D.   On: 12/17/2013 14:55    Filed Vitals:   12/17/13 1616  BP: 117/74  Pulse: 107  Temp:   Resp: 16   1. New onset seizure    Dr. Thad Ranger of neurology has seen and evaluated the patient. She recommends patient discontinues the tramadol and follows up with neurology as an outpatient. Will discharge patient home with return precautions given. Patient d/w with Dr. Radford Pax, agrees with plan.    Jeannetta Ellis, PA-C 12/17/13 2224

## 2013-12-17 NOTE — ED Notes (Signed)
While pt. Was walking, pt. Started having a seizure - twitching all over. No hx. Of seizures. Pt. States, "could of messed a med." hx. Of anxiety. Seizure last a minute. Post tictal 10 minutes. Alert and oriented. Pt. Clammy, tachycardia.
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
## 2013-12-17 NOTE — ED Provider Notes (Signed)
Medical screening examination/treatment/procedure(s) were performed by non-physician practitioner and as supervising physician I was immediately available for consultation/collaboration.   Gladis Soley L Izreal Kock, MD 12/17/13 2037 
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
## 2013-12-17 NOTE — Discharge Instructions (Signed)
Please follow up with your primary care physician in 1-2 days. If you do not have one please call the [facility] number listed above. Please follow up with one of the neurology offices to schedule a follow up appointment.  Please discontinue the use of the tramadol as advised in the ER today. Please read all discharge instructions and return precautions.    Seizure, Adult A seizure is abnormal electrical activity in the brain. Seizures usually last from 30 seconds to 2 minutes. There are various types of seizures. Before a seizure, you may have a warning sensation (aura) that a seizure is about to occur. An aura may include the following symptoms:   Fear or anxiety.  Nausea.  Feeling like the room is spinning (vertigo).  Vision changes, such as seeing flashing lights or spots. Common symptoms during a seizure include:  A change in attention or behavior (altered mental status).  Convulsions with rhythmic jerking movements.  Drooling.  Rapid eye movements.  Grunting.  Loss of bladder and bowel control.  Bitter taste in the mouth.  Tongue biting. After a seizure, you may feel confused and sleepy. You may also have an injury resulting from convulsions during the seizure. HOME CARE INSTRUCTIONS   If you are given medicines, take them exactly as prescribed by your health care provider.  Keep all follow-up appointments as directed by your health care provider.  Do not swim or drive or engage in risky activity during which a seizure could cause further injury to you or others until your health care provider says it is OK.  Get adequate rest.  Teach friends and family what to do if you have a seizure. They should:  Lay you on the ground to prevent a fall.  Put a cushion under your head.  Loosen any tight clothing around your neck.  Turn you on your side. If vomiting occurs, this helps keep your airway clear.  Stay with you until you recover.  Know  whether or not you need emergency care. SEEK IMMEDIATE MEDICAL CARE IF:  The seizure lasts longer than 5 minutes.  The seizure is severe or you do not wake up immediately after the seizure.  You have an altered mental status after the seizure.  You are having more frequent or worsening seizures. Someone should drive you to the emergency department or call local emergency services (911 in [place] MAKE SURE YOU:  Understand these instructions.  Will watch your condition.  Will get help right away if you are not doing well or get worse. Document Released: 03/10/2000 Document Revised: 01/01/2013 Document Reviewed: 10/23/2012 [facility] Patient Information 2015 [place], [place]. This information is not intended to replace advice given to you by your health care provider. Make sure you discuss any questions you have with your health care provider.

## 2013-12-22 ENCOUNTER — Ambulatory Visit (INDEPENDENT_AMBULATORY_CARE_PROVIDER_SITE_OTHER): Admitting: Internal Medicine

## 2013-12-22 ENCOUNTER — Ambulatory Visit

## 2013-12-22 ENCOUNTER — Ambulatory Visit (INDEPENDENT_AMBULATORY_CARE_PROVIDER_SITE_OTHER): Admitting: Neurology

## 2013-12-22 ENCOUNTER — Encounter: Payer: Self-pay | Admitting: Neurology

## 2013-12-22 VITALS — BP 130/80 | HR 91 | Temp 98.7°F | Resp 16 | Ht 64.75 in | Wt 124.8 lb

## 2013-12-22 VITALS — BP 120/74 | HR 88 | Ht 65.35 in | Wt 126.3 lb

## 2013-12-22 DIAGNOSIS — F411 Generalized anxiety disorder: Secondary | ICD-10-CM

## 2013-12-22 DIAGNOSIS — R569 Unspecified convulsions: Secondary | ICD-10-CM

## 2013-12-22 DIAGNOSIS — K140 Glossitis: Secondary | ICD-10-CM

## 2013-12-22 DIAGNOSIS — Z23 Encounter for immunization: Secondary | ICD-10-CM

## 2013-12-22 DIAGNOSIS — M545 Low back pain, unspecified: Secondary | ICD-10-CM

## 2013-12-22 DIAGNOSIS — F341 Dysthymic disorder: Secondary | ICD-10-CM

## 2013-12-22 DIAGNOSIS — F329 Major depressive disorder, single episode, unspecified: Secondary | ICD-10-CM | POA: Insufficient documentation

## 2013-12-22 DIAGNOSIS — F32A Depression, unspecified: Secondary | ICD-10-CM | POA: Insufficient documentation

## 2013-12-22 DIAGNOSIS — G4700 Insomnia, unspecified: Secondary | ICD-10-CM

## 2013-12-22 DIAGNOSIS — F418 Other specified anxiety disorders: Secondary | ICD-10-CM

## 2013-12-22 DIAGNOSIS — K1379 Other lesions of oral mucosa: Secondary | ICD-10-CM

## 2013-12-22 DIAGNOSIS — R292 Abnormal reflex: Secondary | ICD-10-CM

## 2013-12-22 DIAGNOSIS — K1370 Unspecified lesions of oral mucosa: Secondary | ICD-10-CM

## 2013-12-22 MED ORDER — MAGIC MOUTHWASH W/LIDOCAINE: 5.0000 mL | Freq: Four times a day (QID) | ORAL | Status: DC | PRN

## 2013-12-22 MED ORDER — LIDOCAINE VISCOUS 2 % MT SOLN: OROMUCOSAL | Status: DC

## 2013-12-22 MED ORDER — ESCITALOPRAM OXALATE 20 MG PO TABS: 20.0000 mg | ORAL_TABLET | Freq: Every day | ORAL | Status: DC
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
## 2013-12-22 NOTE — Progress Notes (Signed)
Subjective:   This chart was scribed for [name], MD by Arlan Organ, [facility] Scribe. This patient was seen in room 3 and the patient's care was started 8:01 PM.    Patient ID: Maurine Minister, female    DOB: 1985-04-19, 28 y.o.   MRN: [id number]  Chief Complaint  Patient presents with  . Follow-up    pt is here for an follow up regarding an recent seizure on last Wednesday    HPI  HPI Comments: Lecia Esperanza is a 28 y.o. female with a PMHx of anxiety and depression who presents to [facility] here for follow up today. Pt recently experienced a seizure onset 5 days. States she was at work when she walked around the corner and felt very "out of body". She admits to being put on her side after experiencing convulsions. Pt was seen at [facility] where a CT was performed. However, she was not started on any medications. MRI and EEG scheduled for 11/29/13. Neurologist- Dr. Patrcia Dolly possibly attributes seizure to recent start of Tramadol with Paxil for back pain. Ms. Cherylin Mylar mentions an open wound to her tongue secondary to biting her tongue and lip at time of seizure. She has tried OTC Tylenol and magic mouthwash for discomfort without any improvement for symptoms.  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 She denies any sleep disturbances secondary to Seroquel. However, she admits to ongoing anxiety. States anxiety is more pronounced when she is home and thinking about things. She is taking Xanex once daily at night time which helps with symptoms. She denies any changes with Paxil at this time. Living w/ parents and child. Husband in service.  Pt states lower back pain is ongoing and constant. At times pain runs down both posterior lower extremities. Pain is exacerbated with certain movements. Currently she is taking Flexeril and previously Tramadol with mild  temporary improvement for symptoms. She is followed by Dr. Lunette Stands Orthopedist. MRI negative.   Patient Active Problem List   Diagnosis Date Noted  . Depression with anxiety 12/08/2011  . Acne 12/08/2011  . Insomnia 12/08/2011  . Lip laceration 11/07/2011  . Open wound of left upper arm 11/07/2011   Past Medical History  Diagnosis Date  . Anxiety   . Deliberate self-cutting   . Panic attack   . Bipolar 1 disorder   . Allergy   . Depression   . Neuromuscular disorder    Past Surgical History  Procedure Laterality Date  . Cesarean section    . Fracture surgery    . Tonsilectomy, adenoidectomy, bilateral myringotomy and tubes     Allergies  Allergen Reactions  . Morphine And Related Hives  . Vicodin [Hydrocodone-Acetaminophen] Itching    Bad dreams   Prior to Admission medications   Medication Sig Start Date End Date Taking? Authorizing Provider  ALPRAZolam Prudy Feeler) 1 MG tablet Take 1 tablet (1 mg total) by mouth 3 (three) times daily as needed for anxiety. 10/30/13  Yes Carmelina Dane, MD  cyclobenzaprine (FLEXERIL) 10 MG tablet Take 1 tablet (10 mg total) by mouth at bedtime. 05/05/13  Yes Tonye Pearson, MD  escitalopram (LEXAPRO) 20 MG tablet Take 1 tablet (20 mg total) by mouth daily. 05/05/13  Yes Tonye Pearson, MD  meloxicam (MOBIC) 15 MG tablet Take 1 tablet (15 mg total) by mouth daily. For back pain 05/05/13  Yes Tonye Pearson, MD  PARoxetine (PAXIL) 20 MG tablet Take 1 tablet (20 mg total) by mouth daily. 10/30/13  Yes Carmelina Dane, MD  QUEtiapine (SEROQUEL) 100 MG tablet Take 0.5-1 tablets (50-100 mg total) by mouth at bedtime. 10/10/13  Yes Carmelina Dane, MD    Review of Systems  Constitutional: Negative for fever and chills.  Skin: Positive for wound.  Psychiatric/Behavioral: The patient is nervous/anxious.   Mom says shes much better now that she's working   Triage Vitals: BP 130/80  Pulse 91  Temp(Src) 98.7 F (37.1 C) (Oral)  Resp  16  Ht 5' 4.75" (1.645 m)  Wt 124 lb 12.8 oz (56.609 kg)  BMI 20.92 kg/m2  SpO2 99%   Objective:  Physical Exam  Nursing note and vitals reviewed. Constitutional: She is oriented to person, place, and time. She appears well-developed and well-nourished.  HENT:  Head: Normocephalic.  Ulcers under tongue from bite trauma  Eyes: EOM are normal.  Neck: Normal range of motion.  Pulmonary/Chest: Effort normal.  Abdominal: She exhibits no distension.  Musculoskeletal: Normal range of motion.  Neurological: She is alert and oriented to person, place, and time.  Mildly tend over lumbar SLR to 90 neg DTRs symm No sens or motor losses  Psychiatric: She has a normal mood and affect. Her behavior is normal. Thought content normal.     Assessment & Plan:   I personally performed the services described in this documentation, which was scribed in my presence. The recorded information has been reviewed and is accurate.   GAD (generalized anxiety disorder) - Plan: escitalopram (LEXAPRO) 20 MG tablet restart to replace paxil  Bilateral low back pain without sciatica---ref to PT!!!!! This should not require meds///rec starting yoga!!!!  Insomnia, unspecified---stable on meds  Seizure--see neuro note  Ulceration, tongue traumatic--vis xylo  Need for prophylactic vaccination and inoculation against influenza - Plan: Flu Vaccine QUAD 36+ mos IM  F/u 1 month at 104

## 2013-12-23 ENCOUNTER — Ambulatory Visit (INDEPENDENT_AMBULATORY_CARE_PROVIDER_SITE_OTHER): Admitting: Neurology

## 2013-12-23 DIAGNOSIS — R569 Unspecified convulsions: Secondary | ICD-10-CM

## 2013-12-23 DIAGNOSIS — R292 Abnormal reflex: Secondary | ICD-10-CM

## 2013-12-23 NOTE — Procedures (Signed)
ELECTROENCEPHALOGRAM REPORT  Date of Study: 12/23/2013  Patient's Name: Allison Richard MRN: [id number] Date of Birth: 1985-08-10  Referring Provider: Dr. Patrcia DollyKaren Orene Abbasi  Clinical History: 28 year old right-handed woman with a history of anxiety and depression, presenting with new onset seizure suggestive of partial seizure with out of body sensation followed by a generalized tonic-clonic seizure.  Medications: Xanax, Paxil, Seroquel  Technical Summary: A multichannel digital EEG recording measured by the international 10-20 system with electrodes applied with paste and impedances below 5000 ohms performed in our laboratory with EKG monitoring in an awake and asleep patient.  Hyperventilation and photic stimulation were performed.  The digital EEG was referentially recorded, reformatted, and digitally filtered in a variety of bipolar and referential montages for optimal display.    Description: The patient is awake and asleep during the recording.  During maximal wakefulness, there is a symmetric, medium voltage 10 Hz posterior dominant rhythm that attenuates with eye opening.  The record is symmetric.  There is an excess amount of diffuse low voltage beta activity seen throughout the recording with sharply contoured activity. During drowsiness and sleep, there is an increase in theta slowing of the background. Vertex waves and symmetric sleep spindles were seen.  Hyperventilation and photic stimulation did not elicit any abnormalities.  There were no epileptiform discharges or electrographic seizures seen.    EKG lead was unremarkable.  Impression: This awake and asleep EEG is normal except for excess amount of diffuse low voltage beta activity.  Clinical Correlation: Diffuse low voltage beta activity is commonly seen with sedating medications such as benzodiazepines.  In the absence of sedating medications, anxiety and hyperthyroidism may produce generalized beta activity.  The absence of  epileptiform discharges does not exclude a clinical diagnosis of epilepsy.  If further clinical questions remain, prolonged EEG may be helpful.  Clinical correlation is advised.   Patrcia DollyKaren Esiah Bazinet, M.D.

## 2013-12-29 NOTE — Progress Notes (Signed)
Tried to call patient, but VM has not been set up yet.

## 2013-12-31 ENCOUNTER — Telehealth: Payer: Self-pay | Admitting: Family Medicine

## 2013-12-31 NOTE — Telephone Encounter (Signed)
Message copied by Franciso BendMCNEIL, Avedis Bevis M on Wed Dec 31, 2013  3:50 PM ------      Message from: Van ClinesAQUINO, KAREN M      Created: Tue Dec 30, 2013  4:24 PM      Regarding: MRI results       Pls let her know I reviewed MRI brain and neck, which are normal, no evidence of tumor, stroke, bleed. Thanks ------

## 2013-12-31 NOTE — Telephone Encounter (Signed)
Patient notified of results.

## 2014-01-06 NOTE — Progress Notes (Signed)
Kim to sche pt appt

## 2014-01-07 ENCOUNTER — Encounter: Payer: Self-pay | Admitting: Internal Medicine

## 2014-01-07 ENCOUNTER — Ambulatory Visit (INDEPENDENT_AMBULATORY_CARE_PROVIDER_SITE_OTHER): Admitting: Internal Medicine

## 2014-01-07 VITALS — BP 110/72 | HR 79 | Temp 98.7°F | Resp 16 | Ht 65.0 in | Wt 119.4 lb

## 2014-01-07 DIAGNOSIS — F411 Generalized anxiety disorder: Secondary | ICD-10-CM

## 2014-01-07 DIAGNOSIS — R569 Unspecified convulsions: Secondary | ICD-10-CM

## 2014-01-07 DIAGNOSIS — R0981 Nasal congestion: Secondary | ICD-10-CM

## 2014-01-07 DIAGNOSIS — R110 Nausea: Secondary | ICD-10-CM

## 2014-01-07 MED ORDER — AMOXICILLIN 875 MG PO TABS: 875.0000 mg | ORAL_TABLET | Freq: Two times a day (BID) | ORAL | Status: DC

## 2014-01-11 NOTE — Progress Notes (Signed)
   Subjective:    Patient ID: Allison Richard, female    DOB: 10/10/1985, 28 y.o.   MRN: [id number]  HPIsee recent OV Patient Active Problem List   Diagnosis Date Noted  . Seizures--EEG negative per neurology //no seizures since last episode  12/22/2013  . Anxiety state--- more stable  12/22/2013  . Depression----more stable ///due to job is helping  12/22/2013  . Acne 12/08/2011   Complaining of the onset of nasal congestion with postnasal drainage and cough and mild nausea in the morning since her last visit. No fever. Cough is nonproductive. Trouble breathing at night through her nose.    Review of Systems Noncontributory    Objective:   Physical Exam BP 110/72  Pulse 79  Temp(Src) 98.7 F (37.1 C) (Oral)  Resp 16  Ht 5\' 5"  (1.651 m)  Wt 119 lb 6.4 oz (54.159 kg)  BMI 19.87 kg/m2  SpO2 98%  LMP 12/31/2013 TMs clear Conjunctiva not injected Nares with purulent mucus Tender sinus areas maxillary Throat clear No nodes Mood good affect appropriate       Assessment & Plan:  Seizure  Generalized anxiety disorder  Congestion of nasal sinus/sinusitis  Nausea without vomiting  Meds ordered this encounter  Medications  . amoxicillin (AMOXIL) 875 MG tablet    Sig: Take 1 tablet (875 mg total) by mouth 2 (two) times daily.    Dispense:  20 tablet    Refill:  0  otc decongestants No change in other medicines

## 2014-01-20 ENCOUNTER — Ambulatory Visit: Payer: Self-pay | Admitting: Neurology

## 2014-01-20 ENCOUNTER — Ambulatory Visit (INDEPENDENT_AMBULATORY_CARE_PROVIDER_SITE_OTHER): Admitting: Neurology

## 2014-01-20 DIAGNOSIS — R292 Abnormal reflex: Secondary | ICD-10-CM

## 2014-01-20 DIAGNOSIS — R569 Unspecified convulsions: Secondary | ICD-10-CM

## 2014-01-23 NOTE — Procedures (Signed)
ELECTROENCEPHALOGRAM REPORT  Dates of Recording: 01/20/2014 to 01/21/2014  Patient's Name: Allison Richard (formerly [name]) MRN: [id number] Date of Birth: 19-Aug-1985  Referring Provider: Dr. Patrcia DollyKaren Clarann Helvey  Procedure: 24-hour ambulatory EEG  History: This is a [age] year old woman with a history of anxiety and depression, presenting with new onset seizure suggestive of partial seizure with out of body sensation followed by a generalized tonic-clonic seizure.  Medications: Xanax, Paxil, Seroquel  Technical Summary: This is a 24-hour multichannel digital EEG recording measured by the international 10-20 system with electrodes applied with paste and impedances below 5000 ohms performed as portable with EKG monitoring.  The digital EEG was referentially recorded, reformatted, and digitally filtered in a variety of bipolar and referential montages for optimal display.    DESCRIPTION OF RECORDING: During maximal wakefulness, the background activity consisted of a symmetric 11 Hz posterior dominant rhythm which was reactive to eye opening. There is an excess amount of diffuse low voltage beta activity seen throughout the recording.  There were no epileptiform discharges or focal slowing seen in wakefulness.  During the recording, the patient progresses through wakefulness, drowsiness, and Stage 2 sleep.  Again, there were no epileptiform discharges seen.  Events: There were no push button events.  There were no electrographic seizures seen.  EKG lead was unremarkable.  IMPRESSION: This 24-hour ambulatory EEG study is normal except for excess amount of diffuse low voltage beta activity.   Clinical Correlation:  Diffuse low voltage beta activity is commonly seen with sedating medications such as benzodiazepines. The absence of epileptiform discharges does not exclude a clinical diagnosis of epilepsy. Typical events were not captured.   Patrcia DollyKaren Taneesha Edgin, M.D.

## 2014-02-09 ENCOUNTER — Ambulatory Visit (INDEPENDENT_AMBULATORY_CARE_PROVIDER_SITE_OTHER): Admitting: Neurology

## 2014-02-09 ENCOUNTER — Encounter: Payer: Self-pay | Admitting: Neurology

## 2014-02-09 VITALS — BP 110/70 | HR 83 | Resp 16 | Ht 65.0 in | Wt 123.0 lb

## 2014-02-09 DIAGNOSIS — R569 Unspecified convulsions: Secondary | ICD-10-CM

## 2014-02-09 NOTE — Patient Instructions (Signed)
1. Continue to monitor symptoms, call our office for any changes 2. As per [place] driving laws, for any episode of seizure or loss of awareness, one should not drive until 6 months event-free 3. Avoid Tramadol 4. Follow-up in 3 months
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
## 2014-02-18 ENCOUNTER — Ambulatory Visit (INDEPENDENT_AMBULATORY_CARE_PROVIDER_SITE_OTHER): Admitting: Internal Medicine

## 2014-02-18 ENCOUNTER — Encounter: Payer: Self-pay | Admitting: Internal Medicine

## 2014-02-18 ENCOUNTER — Ambulatory Visit (INDEPENDENT_AMBULATORY_CARE_PROVIDER_SITE_OTHER)

## 2014-02-18 VITALS — BP 116/70 | HR 112 | Temp 98.2°F | Resp 18 | Ht 65.0 in | Wt 126.0 lb

## 2014-02-18 DIAGNOSIS — R079 Chest pain, unspecified: Secondary | ICD-10-CM

## 2014-02-18 DIAGNOSIS — G4700 Insomnia, unspecified: Secondary | ICD-10-CM

## 2014-02-18 DIAGNOSIS — L089 Local infection of the skin and subcutaneous tissue, unspecified: Secondary | ICD-10-CM

## 2014-02-18 DIAGNOSIS — R0609 Other forms of dyspnea: Secondary | ICD-10-CM

## 2014-02-18 DIAGNOSIS — I471 Supraventricular tachycardia, unspecified: Secondary | ICD-10-CM

## 2014-02-18 DIAGNOSIS — R590 Localized enlarged lymph nodes: Secondary | ICD-10-CM

## 2014-02-18 DIAGNOSIS — F411 Generalized anxiety disorder: Secondary | ICD-10-CM

## 2014-02-18 LAB — CBC WITH DIFFERENTIAL/PLATELET
BASOS ABS: 0 10*3/uL (ref 0.0–0.1)
BASOS PCT: 0 % (ref 0–1)
Eosinophils Absolute: 0.1 10*3/uL (ref 0.0–0.7)
Eosinophils Relative: 1 % (ref 0–5)
HCT: 37.2 % (ref 36.0–46.0)
HEMOGLOBIN: 12.7 g/dL (ref 12.0–15.0)
Lymphocytes Relative: 30 % (ref 12–46)
Lymphs Abs: 1.9 10*3/uL (ref 0.7–4.0)
MCH: 29.1 pg (ref 26.0–34.0)
MCHC: 34.1 g/dL (ref 30.0–36.0)
MCV: 85.3 fL (ref 78.0–100.0)
MONOS PCT: 9 % (ref 3–12)
MPV: 9.3 fL — AB (ref 9.4–12.4)
Monocytes Absolute: 0.6 10*3/uL (ref 0.1–1.0)
NEUTROS ABS: 3.8 10*3/uL (ref 1.7–7.7)
NEUTROS PCT: 60 % (ref 43–77)
Platelets: 256 10*3/uL (ref 150–400)
RBC: 4.36 MIL/uL (ref 3.87–5.11)
RDW: 13.4 % (ref 11.5–15.5)
WBC: 6.4 10*3/uL (ref 4.0–10.5)

## 2014-02-18 LAB — COMPLETE METABOLIC PANEL WITH GFR
ALK PHOS: 57 U/L (ref 39–117)
ALT: 12 U/L (ref 0–35)
AST: 15 U/L (ref 0–37)
Albumin: 4.5 g/dL (ref 3.5–5.2)
BUN: 15 mg/dL (ref 6–23)
CO2: 25 mEq/L (ref 19–32)
CREATININE: 0.83 mg/dL (ref 0.50–1.10)
Calcium: 9.4 mg/dL (ref 8.4–10.5)
Chloride: 103 mEq/L (ref 96–112)
GFR, Est African American: >89 mL/min
GFR, Est Non African American: >89 mL/min
Glucose, Bld: 86 mg/dL (ref 70–99)
Potassium: 4.1 mEq/L (ref 3.5–5.3)
Sodium: 138 mEq/L (ref 135–145)
Total Bilirubin: 0.7 mg/dL (ref 0.2–1.2)
Total Protein: 7 g/dL (ref 6.0–8.3)

## 2014-02-18 LAB — T4, FREE: Free T4: 0.99 ng/dL (ref 0.80–1.80)

## 2014-02-18 LAB — TSH: TSH: 1.26 u[IU]/mL (ref 0.350–4.500)

## 2014-02-18 MED ORDER — CEPHALEXIN 500 MG PO CAPS: 500.0000 mg | ORAL_CAPSULE | Freq: Three times a day (TID) | ORAL | Status: DC

## 2014-02-18 MED ORDER — ALPRAZOLAM 1 MG PO TABS: ORAL_TABLET | ORAL | Status: DC

## 2014-02-18 MED ORDER — CYCLOBENZAPRINE HCL 10 MG PO TABS: 10.0000 mg | ORAL_TABLET | Freq: Three times a day (TID) | ORAL | Status: DC | PRN

## 2014-02-18 NOTE — Progress Notes (Signed)
Subjective:    Patient ID: Allison Richard, female    DOB: 06/25/1985, 28 y.o.   MRN: [id number]  HPI Brought in from the waiting room urgently because of CP that started this morning. She thought it was related to anxiety so took Xanax which did not help. Pain is worse with deep breathe. Denies fever. Feels like she can get a deep breath. Pain is like a pressure substernal. She does not notice palpitations. No diaphoresis or nausea. EKG reveals sinus tachycardia and exam appears stable. Review of the chart shows several emergency visits for chest pain over the last 2 years without the discovery of cardiac pathology.  This is F/U appt for recent problems with depression anxiety and insomnia. She also is in the aftermath of an evaluation for new onset seizures 12/17/13 Dr Karel JarvisAquino. EEG revealed beta activity but she was on benzos.( We will do thyroid evaluation today). MRI of the brain / C-spine were normal. She has had no further seizures or out of body experiences.  Her current medications are acceptable but she continues to have episodes of anxiety including panic that mainly stem from her current relationship with her mother. She was adopted as a young child. Her father has trouble without call in some. She had her mother have never gotten along. Last night Mom told her she hated her and that she ruined her life. Today the [place] left for [place] and [place] but they left her behind. Troubles with mom not new. Problems worse when she got pregnant. Has tried group counseling with mother, but she left before end of session. Also thinks mom is taking her Xanax. Issues about her divorce are still not resolved and she has had no contact with her husband.    Review of Systems  Constitutional: Negative for fever, diaphoresis and unexpected weight change.  Eyes: Negative for visual disturbance.  Respiratory: Positive for shortness of breath (with walking up stairs). Negative for cough and  wheezing.        History of sports asthma  Cardiovascular: Positive for chest pain. Negative for palpitations.  Gastrointestinal: Negative for abdominal pain.  Endocrine:       She has episodes of feeling hot all over to the point of sweating even when she is not active. These are random without precipitating factors that she can identify.  Musculoskeletal:       Her back pain is somewhat better with activity over the last month  Skin:       She has new piercings on both ears and an area purportedly that helps with anxiety if pierced. She has developed swelling and tenderness on the right.  Neurological: Negative for dizziness, tremors and headaches.  Hematological:       Her cervical adenopathy has improved over this past year although she is still concerned about one area on the left side of her neck and she is very worried that she may have lymphoma or something terrible//ENT was consulted and they felt her nodes to be benign. Dr. Lazarus SalinesWolicki  Psychiatric/Behavioral: Negative for suicidal ideas, hallucinations, confusion, sleep disturbance and self-injury. The patient is nervous/anxious.        Objective:   Physical Exam  Constitutional: She is oriented to person, place, and time. She appears well-developed and well-nourished.  Anxious  HENT:  Right Ear: External ear normal.  Left Ear: External ear normal.  Ears:  Mouth/Throat: Oropharynx is clear and moist.  Boggy turbinates with allergic appearance  Eyes: Conjunctivae and EOM  are normal. Pupils are equal, round, and reactive to light.  Neck: Normal range of motion. No thyromegaly present.  No active cervical adenitis. There is a small 2 cm firm movable nodule at the lower part of the posterior cervical chain on the left which seems benign  Cardiovascular: Normal rate, regular rhythm, normal heart sounds and intact distal pulses.   No murmur heard. In contrast to her presentation with a tachycardia her rate during exam is in the 80s.  No midsystolic click. She has tenderness along the costosternal junctions anteriorly as has been noted in past emergency visits for chest pain.  Pulmonary/Chest: Effort normal and breath sounds normal. She has no wheezes. She has no rales. She exhibits tenderness.  Abdominal: There is no tenderness. There is no guarding.  No hepatosplenomegaly or masses  Musculoskeletal: She exhibits no edema.  Neurological: She is alert and oriented to person, place, and time. No cranial nerve deficit. Coordination normal.  Patellar reflexes 3+ and brisk. One beat of ankle clonus but nothing sustained. 2+ symmetrical upper extremity reflexes  Skin: No rash noted.  Psychiatric: Judgment and thought content normal.  She is in tears as she describes her current relationship with mother. During the early part of the exam she experienced worsening of her chest pain with inability to get a deep breath and over the course of a few minutes developed periOral tingling. She was noted to be hyperventilating. She told 1 mg Xanax and over the next 20 minutes this resolved.   [facility] reading (PRIMARY) by  Dr. Doolittle. No acute cardiopulmonary findings.  EKG exhibits sinus tachycardia with no acute changes. The computer interprets this as atrial flutter but there is no flutter present.     <MEASUREMMorris County HospitalNSan Antonio Regional [phone] CSha<MEASUREMENSpecialty Surgical Center Of Encino>[place] [facility] [phone] Po303316WichiSt FrancSha<MEASUREMEN[facility]>Gi Endoscopy [phone] Isl859Grays Reston Sha<MEASUREMENGoodland Regional [facility] [phone] Charles MeSha<MEASUREMENSt. [facility]>Physicians Surgical [phone] SuSha<MEASUREMENWy[facility]>Swall Medical [phone] E8Memorial Hermann SurgiDanvilSha<MEASUREMENGateway Ambulatory [facility] [phone] [facility] Inc Dba [facility] University Eye AssociatesLieber Correctional InstSha<MEASUREMENNorthport Va Medical Center>Alaska Regional HoWinfi516Sha<MEASUREMENAvera Behavioral Health Center>Hasbro Childrens [phone] SpecialSha<MEASUREMENSaint Anthony Medical Center>Okc-Amg Specialty HoWinfi85SSha<MEASUREMENWadley Regional Medical Center>Atlantic General [phone] StAscension Our LadySha<MEASUREMENArkansas Dept. Of Correction-Diagnostic Unit>eGulf Comprehensive [phone] SouthernCenter For Advanced PlSha<MEASUREMENFreeway Surgery Center LLC Dba Legacy Surgery Center>Ambulatory Surgical Center Of [phone] LNaSha<MEASUREMENInova Loudoun Ambulatory Surgery [facility] LLC>Franciscan Surgery [phone] Pierce No11Va GulGrinnellSha<MEASUREMENScheurer HospitaWarm Springs Rehabilitation Hospital Of [phone]<MEASUREMENVa [facility] Of [phone] Be48077569Forest Ambulatory Surgical Associates LLC Dba ForestQueens BSha<MEASUREMENCentral [facility] Endoscopy [phone] GastroenteroEncompass Health RehabilitationSha<MEASUREMENValdosta Endoscopy Center LLC>Ambulatory Surgical Center Of [phone](64Manning ReSha<MEASUREMENIndiana University Health [facility]>St [place] [phone] Be41NorthContinuecare Hospital At PalmetSha<MEASUREMENMemorial Hermann Orthopedic And [facility]ine [facility]>ePlaza Surgery [phone](Children'S HospitSha<MEASUREMENM[facility] Regional Rehabilitation [facility] Community [phone] Of VeteraArizona OutpatieSha<MEASUREMENChapman Medical Center>Upmc [phone] Community HoSha<MEASUREMENBaylor Scott [facility] White [facility]ano>Arizona Digestive InstituWinfi510 616 West88Mark TwRiver ViSha<MEASUREMENAmsc LLC>Mon[facility] [phone] [place] ChSha<MEASUREMENLake City Surgery Center LLTirr Memorial [phone]<MEASUREMENBrecksville Surgery Ctr>Pacific Coast Surgical [phone] Hospitals OfMayo Clinic HSharrell Kus MankatoMonroevilleChest pain, unspecified chest pain type-musculoskeletal in origin and associated with or intensified by panic attacks  Sinus tachycardia related to anxiety/panic attacks   Skin infection--r ear piercing - cephALEXin (KEFLEX) 500 MG capsule tid  DOE (dyspnea on exertion) -no current evidence of problem  Hypocalcemia was noted on last labs and will be rechecked  GAD (generalized anxiety disorder) - Plan: Temporarily increase use of ALPRAZolam (XANAX) /continued Lexapro/counseling if possible/discuss resolution of issues with mom-alternative communication-future independence//consider increasing Seroquel next  if necessary  Insomnia--responding to Seroquel for nail  Lymphadenopathy, cervical--- resolved and stable/reassured  Musculoskeletal back pain--may use Flexeril at bedtime or at work   Meds ordered this encounter  Medications  . cephALEXin (KEFLEX) 500 MG capsule    Sig: Take 1 capsule (500 mg total) by mouth 3 (three) times daily.    Dispense:  30 capsule    Refill:  1    Order Specific Question:  Supervising Provider    Answer:  SMITH, KRISTI M [[id number]]  . ALPRAZolam (XANAX) 1 MG tablet    Sig: 1 am, 1 midday, 2 hs    Dispense:  120 tablet    Refill:  5  . cyclobenzaprine (FLEXERIL) 10 MG tablet    Sig: Take 1 tablet (10 mg total) by mouth 3 (three) times daily as needed for muscle spasms.    Dispense:  30 tablet    Refill:  2

## 2014-02-21 ENCOUNTER — Encounter: Payer: Self-pay | Admitting: Internal Medicine

## 2014-03-16 ENCOUNTER — Telehealth: Payer: Self-pay

## 2014-03-16 NOTE — Telephone Encounter (Signed)
Pt would like to know if she can have her ALPRAZolam Prudy Feeler(XANAX) 1 MG tablet [[id number]][[id number]] refilled early, she leave to go out of town the 23rd, and it cannot be refilled until the 25th

## 2014-03-16 NOTE — Telephone Encounter (Signed)
Spoke to pharmacy- they can not refill this medication early

## 2014-03-18 ENCOUNTER — Encounter: Payer: Self-pay | Admitting: Internal Medicine

## 2014-03-18 ENCOUNTER — Ambulatory Visit (INDEPENDENT_AMBULATORY_CARE_PROVIDER_SITE_OTHER): Admitting: Internal Medicine

## 2014-03-18 VITALS — BP 126/72 | HR 87 | Temp 98.8°F | Resp 16 | Ht 64.75 in | Wt 127.6 lb

## 2014-03-18 DIAGNOSIS — F418 Other specified anxiety disorders: Secondary | ICD-10-CM

## 2014-03-18 DIAGNOSIS — F411 Generalized anxiety disorder: Secondary | ICD-10-CM

## 2014-03-18 NOTE — Progress Notes (Signed)
   Subjective:    Patient ID: Allison Richard, female    DOB: 12/30/1985, 28 y.o.   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 working well Worried about being "fat" Admits little exercise    Review of Systems noncon    Objective:   Physical Exam Wt Readings from Last 3 Encounters:  03/18/14 127 lb 9.6 oz (57.879 kg)  02/18/14 126 lb (57.153 kg)  02/09/14 123 lb (55.792 kg)  BP 126/72 mmHg  Pulse 87  Temp(Src) 98.8 F (37.1 C) (Oral)  Resp 16  Ht 5' 4.75" (1.645 m)  Wt 127 lb 9.6 oz (57.879 kg)  BMI 21.39 kg/m2  SpO2 100%  LMP 03/02/2014 HEENT cl Mainly nontoned muscle Mood good /affect appropr        Assessment & Plan:  Depression with anxiety  Generalized anxiety disorder  Cont meds F/u 3 mo Sooner if worse Disc "triangle " w/ mom

## 2014-03-27 DIAGNOSIS — Z9151 Personal history of suicidal behavior: Secondary | ICD-10-CM

## 2014-03-27 HISTORY — DX: Personal history of suicidal behavior: Z91.51

## 2014-04-24 ENCOUNTER — Other Ambulatory Visit: Payer: Self-pay | Admitting: Emergency Medicine

## 2014-04-26 ENCOUNTER — Ambulatory Visit (INDEPENDENT_AMBULATORY_CARE_PROVIDER_SITE_OTHER): Admitting: Internal Medicine

## 2014-04-26 VITALS — BP 112/77 | HR 93 | Temp 98.4°F | Resp 17 | Ht 66.0 in | Wt 135.0 lb

## 2014-04-26 DIAGNOSIS — F418 Other specified anxiety disorders: Secondary | ICD-10-CM

## 2014-04-26 DIAGNOSIS — J988 Other specified respiratory disorders: Secondary | ICD-10-CM

## 2014-04-26 DIAGNOSIS — R635 Abnormal weight gain: Secondary | ICD-10-CM

## 2014-04-26 DIAGNOSIS — J22 Unspecified acute lower respiratory infection: Secondary | ICD-10-CM

## 2014-04-26 DIAGNOSIS — S0990XA Unspecified injury of head, initial encounter: Secondary | ICD-10-CM

## 2014-04-26 MED ORDER — QUETIAPINE FUMARATE 100 MG PO TABS: 50.0000 mg | ORAL_TABLET | Freq: Every day | ORAL | Status: DC

## 2014-04-26 MED ORDER — HYDROCODONE-HOMATROPINE 5-1.5 MG/5ML PO SYRP: 5.0000 mL | ORAL_SOLUTION | Freq: Four times a day (QID) | ORAL | Status: DC | PRN

## 2014-04-26 MED ORDER — AZITHROMYCIN 250 MG PO TABS: ORAL_TABLET | ORAL | Status: DC
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
## 2014-05-12 ENCOUNTER — Ambulatory Visit: Payer: Self-pay | Admitting: Neurology

## 2014-05-12 DIAGNOSIS — Z029 Encounter for administrative examinations, unspecified: Secondary | ICD-10-CM

## 2014-05-13 ENCOUNTER — Telehealth: Payer: Self-pay | Admitting: Neurology

## 2014-05-13 ENCOUNTER — Other Ambulatory Visit: Payer: Self-pay | Admitting: Internal Medicine

## 2014-05-13 NOTE — Telephone Encounter (Signed)
Pt no showed follow up appt 05/12/14 w/ Dr. Karel JarvisAquino. No show letter + policy mailed to pt / Sherri S.

## 2014-05-14 ENCOUNTER — Encounter: Payer: Self-pay | Admitting: *Deleted

## 2014-06-12 ENCOUNTER — Other Ambulatory Visit: Payer: Self-pay | Admitting: Internal Medicine

## 2014-06-17 ENCOUNTER — Encounter: Admitting: Internal Medicine

## 2014-06-17 ENCOUNTER — Encounter: Payer: Self-pay | Admitting: Internal Medicine

## 2014-06-17 VITALS — BP 119/76 | HR 96 | Temp 99.0°F | Resp 16 | Ht 65.5 in | Wt 136.8 lb

## 2014-06-19 NOTE — Progress Notes (Signed)
This encounter was created in error - please disregard.

## 2014-07-08 ENCOUNTER — Ambulatory Visit: Payer: Self-pay | Admitting: Internal Medicine

## 2014-07-22 ENCOUNTER — Ambulatory Visit (INDEPENDENT_AMBULATORY_CARE_PROVIDER_SITE_OTHER): Admitting: Internal Medicine

## 2014-07-22 ENCOUNTER — Encounter: Payer: Self-pay | Admitting: Internal Medicine

## 2014-07-22 VITALS — BP 106/70 | HR 115 | Temp 98.5°F | Resp 16 | Ht 66.0 in | Wt 136.6 lb

## 2014-07-22 DIAGNOSIS — M545 Low back pain, unspecified: Secondary | ICD-10-CM

## 2014-07-22 DIAGNOSIS — G4700 Insomnia, unspecified: Secondary | ICD-10-CM

## 2014-07-22 DIAGNOSIS — F411 Generalized anxiety disorder: Secondary | ICD-10-CM | POA: Diagnosis not present

## 2014-07-22 MED ORDER — METHOCARBAMOL 500 MG PO TABS: 500.0000 mg | ORAL_TABLET | Freq: Three times a day (TID) | ORAL | Status: DC | PRN

## 2014-07-22 MED ORDER — ESCITALOPRAM OXALATE 20 MG PO TABS: ORAL_TABLET | ORAL | Status: DC

## 2014-07-22 MED ORDER — CLONAZEPAM 1 MG PO TABS: ORAL_TABLET | ORAL | Status: DC

## 2014-07-22 MED ORDER — QUETIAPINE FUMARATE 200 MG PO TABS: 100.0000 mg | ORAL_TABLET | Freq: Every day | ORAL | Status: DC
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
## 2014-07-30 ENCOUNTER — Other Ambulatory Visit: Payer: Self-pay | Admitting: Internal Medicine

## 2014-08-30 ENCOUNTER — Ambulatory Visit (INDEPENDENT_AMBULATORY_CARE_PROVIDER_SITE_OTHER): Admitting: Internal Medicine

## 2014-08-30 VITALS — BP 132/70 | HR 96 | Temp 97.8°F | Resp 16 | Ht 64.5 in | Wt 135.8 lb

## 2014-08-30 DIAGNOSIS — T485X1A Poisoning by other anti-common-cold drugs, accidental (unintentional), initial encounter: Secondary | ICD-10-CM | POA: Diagnosis not present

## 2014-08-30 DIAGNOSIS — F1110 Opioid abuse, uncomplicated: Secondary | ICD-10-CM

## 2014-08-30 DIAGNOSIS — F418 Other specified anxiety disorders: Secondary | ICD-10-CM

## 2014-08-30 DIAGNOSIS — F1190 Opioid use, unspecified, uncomplicated: Secondary | ICD-10-CM

## 2014-08-30 DIAGNOSIS — F411 Generalized anxiety disorder: Secondary | ICD-10-CM | POA: Diagnosis not present

## 2014-08-30 DIAGNOSIS — J310 Chronic rhinitis: Secondary | ICD-10-CM | POA: Diagnosis not present

## 2014-08-30 DIAGNOSIS — T485X5A Adverse effect of other anti-common-cold drugs, initial encounter: Secondary | ICD-10-CM

## 2014-08-30 MED ORDER — ALPRAZOLAM 1 MG PO TABS: ORAL_TABLET | ORAL | Status: DC

## 2014-08-30 MED ORDER — PREDNISONE 20 MG PO TABS
ORAL_TABLET | ORAL | Status: DC
Start: 2014-08-30 — End: 2014-10-30

## 2014-08-30 MED ORDER — CLONIDINE HCL 0.1 MG PO TABS: 0.1000 mg | ORAL_TABLET | Freq: Two times a day (BID) | ORAL | Status: DC

## 2014-08-30 MED ORDER — FLUTICASONE PROPIONATE 50 MCG/ACT NA SUSP: NASAL | Status: DC

## 2014-08-30 MED ORDER — PROMETHAZINE HCL 25 MG PO TABS: 25.0000 mg | ORAL_TABLET | ORAL | Status: DC | PRN

## 2014-08-30 MED ORDER — CYCLOBENZAPRINE HCL 10 MG PO TABS: 10.0000 mg | ORAL_TABLET | Freq: Three times a day (TID) | ORAL | Status: DC | PRN
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
## 2014-08-30 NOTE — Patient Instructions (Signed)
Amanda Kirby--[facility]--Will Nickie RetortKrause Taylor Krumroy--Private office [facility]  recehck Friday am [phone]

## 2014-09-04 ENCOUNTER — Ambulatory Visit (INDEPENDENT_AMBULATORY_CARE_PROVIDER_SITE_OTHER): Admitting: Internal Medicine

## 2014-09-04 VITALS — BP 112/72 | HR 90 | Temp 97.7°F | Resp 18 | Ht 65.0 in | Wt 136.0 lb

## 2014-09-04 DIAGNOSIS — F1110 Opioid abuse, uncomplicated: Secondary | ICD-10-CM | POA: Diagnosis not present

## 2014-09-04 DIAGNOSIS — F411 Generalized anxiety disorder: Secondary | ICD-10-CM | POA: Diagnosis not present

## 2014-09-04 DIAGNOSIS — F1190 Opioid use, unspecified, uncomplicated: Secondary | ICD-10-CM

## 2014-09-04 MED ORDER — ALPRAZOLAM 1 MG PO TABS: ORAL_TABLET | ORAL | Status: DC
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
## 2014-09-04 NOTE — Patient Instructions (Signed)
Purchase a bottle of Miralax over the counter as well as a box of 5 mg dulcolax tablets.  Take 4 dulcolax tablets.  Wait 1 hour.  You will then drink 6-8 capfuls of Miralax mixed in an adequate amount of water/juice/gatorade (you may choose which of these liquids to drink) over the next 2-3 hours.  You should expect results within 1 to 6 hours after completing the bowel purge.  

## 2014-09-23 ENCOUNTER — Telehealth: Payer: Self-pay

## 2014-09-23 DIAGNOSIS — F411 Generalized anxiety disorder: Secondary | ICD-10-CM

## 2014-09-23 NOTE — Telephone Encounter (Signed)
Pt's mother called asking to speak with someone about her daughters visit and how things are still not good

## 2014-09-24 NOTE — Telephone Encounter (Signed)
Called mom, she feels her daughter has started using again. She stated they agreed to see you first and if the plan does not work she would go to rehab. Mom wants to talk to Dr. Merla Richesoolittle only. Mom is very concerned.

## 2014-09-25 NOTE — Telephone Encounter (Signed)
Pt's mother Allison Richard called answering service about her daughter's relapse.  She was happy to hear that Allison Richard will be in on Sun and REALLY wants to here from him then or will come in if needed.

## 2014-09-25 NOTE — Telephone Encounter (Signed)
Mom called saying she missed her CB from Dr. Merla Richesoolittle. Please advise at [phone]

## 2014-09-25 NOTE — Telephone Encounter (Signed)
Out town til sun am--call her to let her know i'm working sunday

## 2014-09-26 ENCOUNTER — Emergency Department (HOSPITAL_COMMUNITY)
Admission: EM | Admit: 2014-09-26 | Discharge: 2014-09-26 | Disposition: A | Discharge status: Home / Self Care | Attending: Emergency Medicine | Admitting: Emergency Medicine

## 2014-09-26 ENCOUNTER — Encounter (HOSPITAL_COMMUNITY): Payer: Self-pay

## 2014-09-26 DIAGNOSIS — Z79899 Other long term (current) drug therapy: Secondary | ICD-10-CM | POA: Insufficient documentation

## 2014-09-26 DIAGNOSIS — Z720 Tobacco use: Secondary | ICD-10-CM | POA: Insufficient documentation

## 2014-09-26 DIAGNOSIS — Z87828 Personal history of other (healed) physical injury and trauma: Secondary | ICD-10-CM | POA: Insufficient documentation

## 2014-09-26 DIAGNOSIS — F1123 Opioid dependence with withdrawal: Secondary | ICD-10-CM | POA: Insufficient documentation

## 2014-09-26 DIAGNOSIS — Z3202 Encounter for pregnancy test, result negative: Secondary | ICD-10-CM | POA: Insufficient documentation

## 2014-09-26 DIAGNOSIS — F1310 Sedative, hypnotic or anxiolytic abuse, uncomplicated: Secondary | ICD-10-CM | POA: Insufficient documentation

## 2014-09-26 DIAGNOSIS — F1193 Opioid use, unspecified with withdrawal: Secondary | ICD-10-CM

## 2014-09-26 DIAGNOSIS — F410 Panic disorder [episodic paroxysmal anxiety] without agoraphobia: Secondary | ICD-10-CM | POA: Insufficient documentation

## 2014-09-26 DIAGNOSIS — Z8659 Personal history of other mental and behavioral disorders: Secondary | ICD-10-CM | POA: Insufficient documentation

## 2014-09-26 DIAGNOSIS — F319 Bipolar disorder, unspecified: Secondary | ICD-10-CM | POA: Insufficient documentation

## 2014-09-26 DIAGNOSIS — R45851 Suicidal ideations: Secondary | ICD-10-CM

## 2014-09-26 LAB — SALICYLATE LEVEL: Salicylate Lvl: <4 mg/dL (ref 2.8–30.0)

## 2014-09-26 LAB — COMPREHENSIVE METABOLIC PANEL
ALBUMIN: 4.7 g/dL (ref 3.5–5.0)
ALT: 16 U/L (ref 14–54)
ANION GAP: 11 (ref 5–15)
AST: 20 U/L (ref 15–41)
Alkaline Phosphatase: 42 U/L (ref 38–126)
BUN: 13 mg/dL (ref 6–20)
CO2: 23 mmol/L (ref 22–32)
CREATININE: 0.9 mg/dL (ref 0.44–1.00)
Calcium: 9.5 mg/dL (ref 8.9–10.3)
Chloride: 105 mmol/L (ref 101–111)
GFR calc Af Amer: >60 mL/min (ref >60)
GLUCOSE: 99 mg/dL (ref 65–99)
POTASSIUM: 3.8 mmol/L (ref 3.5–5.1)
Sodium: 139 mmol/L (ref 135–145)
Total Bilirubin: 1.2 mg/dL (ref 0.3–1.2)
Total Protein: 7.9 g/dL (ref 6.5–8.1)

## 2014-09-26 LAB — RAPID URINE DRUG SCREEN, HOSP PERFORMED
Amphetamines: NOT DETECTED
BENZODIAZEPINES: POSITIVE — AB
Barbiturates: NOT DETECTED
Cocaine: NOT DETECTED
Opiates: POSITIVE — AB
Tetrahydrocannabinol: NOT DETECTED

## 2014-09-26 LAB — CBC
HCT: 40.1 % (ref 36.0–46.0)
HEMOGLOBIN: 13.5 g/dL (ref 12.0–15.0)
MCH: 28.4 pg (ref 26.0–34.0)
MCHC: 33.7 g/dL (ref 30.0–36.0)
MCV: 84.4 fL (ref 78.0–100.0)
PLATELETS: 284 10*3/uL (ref 150–400)
RBC: 4.75 MIL/uL (ref 3.87–5.11)
RDW: 13 % (ref 11.5–15.5)
WBC: 5.9 10*3/uL (ref 4.0–10.5)

## 2014-09-26 LAB — POC URINE PREG, ED: PREG TEST UR: NEGATIVE

## 2014-09-26 LAB — ACETAMINOPHEN LEVEL: Acetaminophen (Tylenol), Serum: <10 ug/mL — ABNORMAL LOW (ref 10–30)

## 2014-09-26 LAB — ETHANOL

## 2014-09-26 MED ORDER — ONDANSETRON HCL 4 MG PO TABS: 4.0000 mg | ORAL_TABLET | Freq: Three times a day (TID) | ORAL | Status: DC | PRN

## 2014-09-26 MED ORDER — TRAZODONE HCL 50 MG PO TABS
50.0000 mg | ORAL_TABLET | Freq: Once | ORAL | Status: AC
  Administered 2014-09-26: 50 mg via ORAL
  Filled 2014-09-26: qty 1

## 2014-09-26 MED ORDER — IBUPROFEN 200 MG PO TABS
600.0000 mg | ORAL_TABLET | Freq: Three times a day (TID) | ORAL | Status: DC | PRN
  Administered 2014-09-26: 600 mg via ORAL
  Filled 2014-09-26: qty 3
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
## 2014-09-26 NOTE — ED Notes (Signed)
Pt. Noted in room. No complaints or concerns voiced. No distress or abnormal behavior noted. Will continue to monitor with security cameras. Q 15 minute rounds continue. 

## 2014-09-26 NOTE — BH Assessment (Signed)
1942:  Consulted with Dr. Donnald GarrePfeiffer about the Patient.   Reports Patient detoxing from heroin and is currently experiencing withdrawal symptoms.  Reports Patient is SI with a plan to use a gun her Father has in the home.  Reports [facility].  1945 to 2010: Conducted assessment.  2015:  Consulted with Maryjean Mornharles Kober, PA Extender.  Per Extender Patient  Does not meet inpatient criteria.  2020:  Provided Dr. Donnald GarrePfeiffer with Patient's disposition.

## 2014-09-26 NOTE — ED Notes (Signed)
Pt. Noted sleeping in room. No complaints or concerns voiced. No distress or abnormal behavior noted. Will continue to monitor with security cameras. Q 15 minute rounds continue. 

## 2014-09-26 NOTE — ED Notes (Signed)
Pt. To [facility] from ED ambulatory without difficulty, to room 36 . [facility]. Pt. Is alert and oriented, warm and dry in no distress. Pt. Denies SI, HI, and AVH. Pt. Calm and cooperative. Pt. Made aware of security cameras and Q15 minute rounds. Pt. Encouraged to let Nursing staff know of any concerns or needs.

## 2014-09-26 NOTE — ED Notes (Signed)
Pt presents with request for detox from heroine. Pt reports her last use was approx 4-5 days ago and that [facility] is [facility]. Pt reports SI, denies HI. Pt reports her plan would be to get her dad's gun and commit suicide that way. Pt is tearful in triage.

## 2014-09-26 NOTE — ED Notes (Signed)
Pt reported having poor appetite and given crackers and ice per request.

## 2014-09-26 NOTE — ED Notes (Signed)
Cheral MarkerBill Mosco [phone]- Pt's father- contact for info if needed.

## 2014-09-26 NOTE — Discharge Instructions (Signed)
Opioid Withdrawal °Opioids are a group of narcotic drugs. They include the street drug heroin. They also include pain medicines, such as morphine, hydrocodone, oxycodone, and fentanyl. Opioid withdrawal is a group of characteristic physical and mental signs and symptoms. It typically occurs if you have been using opioids daily for several weeks or longer and stop using or rapidly decrease use. Opioid withdrawal can also occur if you have used opioids daily for a long time and are given a medicine to block the effect.  °SIGNS AND SYMPTOMS °Opioid withdrawal includes three or more of the following symptoms:  °· Depressed, anxious, or irritable mood. °· Nausea or vomiting. °· Muscle aches or spasms.   °· Watery eyes.    °· Runny nose. °· Dilated pupils, sweating, or hairs standing on end. °· Diarrhea or intestinal cramping. °· Yawning.   °· Fever. °· Increased blood pressure. °· Fast pulse. °· Restlessness or trouble sleeping. °These signs and symptoms occur within several hours of stopping or reducing short-acting opioids, such as heroin. They can occur within 3 days of stopping or reducing long-acting opioids, such as methadone. Withdrawal begins within minutes of receiving a drug that blocks the effects of opioids, such as naltrexone or naloxone. °DIAGNOSIS  °Opioid use disorder is diagnosed by your health care provider. You will be asked about your symptoms, drug and alcohol use, medical history, and use of medicines. A physical exam may be done. Lab tests may be ordered. Your health care provider may have you see a mental health professional.  °TREATMENT  °The treatment for opioid withdrawal is usually provided by medical doctors with special training in substance use disorders (addiction specialists). The following medicines may be included in treatment: °· Opioids given in place of the abused opioid. They turn on opioid receptors in the brain and lessen or prevent withdrawal symptoms. They are gradually  decreased (opioid substitution and taper). °· Non-opioids that can lessen certain opioid withdrawal symptoms. They may be used alone or with opioid substitution and taper. °Successful long-term recovery usually requires medicine, counseling, and group support. °HOME CARE INSTRUCTIONS  °· Take medicines only as directed by your health care provider. °· Check with your health care provider before starting new medicines. °· Keep all follow-up visits as directed by your health care provider. °SEEK MEDICAL CARE IF: °· You are not able to take your medicines as directed. °· Your symptoms get worse. °· You relapse. °SEEK IMMEDIATE MEDICAL CARE IF: °· You have serious thoughts about hurting yourself or others. °· You have a seizure. °· You lose consciousness. °Document Released: 03/16/2003 Document Revised: 07/28/2013 Document Reviewed: 03/26/2013 °ExitCare® Patient Information ©2015 [employer]. This information is not intended to replace advice given to you by your health care provider. Make sure you discuss any questions you have with your health care provider. ° °Emergency Department Resource Guide °1) Find a Doctor and Pay Out of Pocket °Although you won't have to find out who is covered by your insurance plan, it is a good idea to ask around and get recommendations. You will then need to call the office and see if the doctor you have chosen will accept you as a new patient and what types of options they offer for patients who are self-pay. Some doctors offer discounts or will set up payment plans for their patients who do not have insurance, but you will need to ask so you aren't surprised when you get to your appointment. ° °2) Contact Your Local Health Department °Not all health departments have   doctors that can see patients for sick visits, but many do, so it is worth a call to see if yours does. If you don't know where your local health department is, you can check in your phone book. The CDC also has a tool to  help you locate your state's health department, and many state websites also have listings of all of their local health departments. ° °3) Find a Walk-in Clinic °If your illness is not likely to be very severe or complicated, you may want to try a walk in clinic. These are popping up all over the country in pharmacies, drugstores, and shopping centers. They're usually staffed by nurse practitioners or physician assistants that have been trained to treat common illnesses and complaints. They're usually fairly quick and inexpensive. However, if you have serious medical issues or chronic medical problems, these are probably not your best option. ° °No Primary Care Doctor: °- Call [facility] at  832-8000 - they can help you locate a primary care doctor that  accepts your insurance, provides certain services, etc. °- Physician Referral Service- 1-800-533-3463 ° °Chronic Pain Problems: °Organization         Address  Phone   Notes  °[facility]  (336) 297-2271 Patients need to be referred by their primary care doctor.  ° °Medication Assistance: °Organization         Address  Phone   Notes  °[place] [facility] [address] °[place], [place] [address] (336) 641-8030 --Must be a resident of [place] °-- Must have NO insurance coverage whatsoever (no Medicaid/ Medicare, etc.) °-- The pt. MUST have a primary care doctor that directs their care regularly and follows them in the community °  °[facility]  (866) 331-1348   °[employer] Way  (888) 892-1162   ° °Agencies that provide inexpensive medical care: °Organization         Address  Phone   Notes  °[facility]  (336) 832-8035   °[facility]    (336) 832-7272   °[facility] [address] °[place], [place] [address] (336) 832-4777   °[facility] [address], °[place] (336) 271-4999   °[facility]    (336) 373-0678   °[facility]    (336) 272-1050   °[facility] ° [address], [place] Phone:  (336) 832-4444, Fax:  (336) 832-4440 Hours of Operation:  9 am - 6 pm, M-F.  Also accepts Medicaid/Medicare and self-pay.  °[facility] ° [address], [place] Phone: (336) 832-3150, Fax: (336) 832-3151. Hours of Operation:  8:30 am - 5:30 pm, M-F.  Also accepts Medicaid and self-pay.  °[facility] [place] [address], [place] Phone: (336) 878-6027   °[facility] [address], [place], [place] (336)723-1848, Ext. 123 Mondays & Thursdays: 7-9 AM.  First 15 patients are seen on a first come, first serve basis. °  ° °Medicaid-accepting [place] Providers: ° °Organization         Address  Phone   Notes  °[facility] [address], [place] (336) 641-2100 Also accepts self-pay patients.  °[facility] [address], [place] ° (336) 856-9996   °[facility] [address], [place] (336) 288-8857   °[facility] [address]   [place] [address], [place] (336) 299-7000   °Veita Bland [address], [place]  ° (336) 373-1557 Only accepts [place] Access Medicaid patients after they have their name applied to their card.  ° °Self-Pay (no insurance) in [place]: ° °Organization         Address  Phone   Notes  °Sickle Cell Patients, [facility] [address], [place] (336) 832-1970   °[facility] [address], [place] (336) 832-4400   °[facility] ° [address], [place] (336) 992-4800   °[facility]/Dr. Osei-Bonsu ° [address] [place] [address], [place] or [address], [place] (336) 841-8500 Phone number for both [place] and [place] locations is the same.  °[facility] [address],  [place] (336) 299-0000   °[facility] [address] [place] [address], [place] or [address] (336) 852-7530 °(336) 878-2260   °[facility] [address], [place] (336) 350-1642, phone; (336) 294-5005, fax Sees patients 1st and 3rd Saturday of every month.  Must not qualify for public or private insurance (i.e. Medicaid, Medicare, [place] Health Choice, Veterans' Benefits) • Household income should be no more than 200% of the poverty level •The clinic cannot treat you if you are pregnant or think you are pregnant • Sexually transmitted diseases are not treated at the clinic.  ° ° °Dental Care: °Organization         Address  Phone  Notes  °[facility] [address], [place] (336) 641-6152 Accepts children up to age 21 who are enrolled in Medicaid or [place] Health Choice; pregnant women with a Medicaid card; and children who have applied for Medicaid or [place] Health Choice, but were declined, whose parents can pay a reduced fee at time of service.  °[facility] [place]  [address], [place] (336) 641-7733 Accepts children up to age 21 who are enrolled in Medicaid or [place] Health Choice; pregnant women with a Medicaid card; and children who have applied for Medicaid or [place] Health Choice, but were declined, whose parents can pay a reduced fee at time of service.  °[facility] ° [address], [place] (336) 641-4533 Patients are seen by appointment only. Walk-ins are not accepted. [facility] will see patients 18 years of age and older. °Monday - Tuesday (8am-5pm) °Most Wednesdays (8:30-5pm) °$30 per visit, cash only  °[facility] ° [address], [place] (336) 641-4533 Patients are seen by appointment only. Walk-ins are not accepted. [facility] will see patients 18 years of age and older. °One Wednesday Evening  (Monthly: Volunteer Based).  $30 per visit, cash only  °[facility]  (919) 537-3737 for adults; Children under age 4, call Graduate Pediatric Dentistry at (919) [phone]. Children aged 4-14, please call (919) [phone] to request a pediatric application. ° Dental services are provided in all areas of dental care including fillings, crowns and bridges, complete and partial dentures, implants, gum treatment, root canals, and extractions. Preventive care is also provided. Treatment is provided to both adults and children. °Patients are selected via a lottery and there is often a waiting list. °  °[facility] [address], ° ° (336) 763-8833 [email or web address] °  °[facility] [address], [place], [place] (336)723-1848, Ext. 123 Second and Fourth Thursday of each month, opens at 6:30 AM; Clinic ends at 9 AM.  Patients are seen on a first-come first-served basis, and a limited number are seen during each clinic.  ° °[facility] ° [address], [place], [employer] (336) 723-7904   Eligibility Requirements °You must have lived in [place], [place], or [place] counties for at least the last three months. °  You cannot be eligible for state or federal sponsored healthcare insurance, including Veterans Administration, Medicaid, or Medicare. °  You generally cannot be eligible for healthcare insurance through your employer.  °  How to apply: °Eligibility screenings are held every Tuesday and Wednesday afternoon from 1:00 pm until 4:00 pm. You do not need an appointment for the interview!  °[facility] [address], [place], [place] 336-631-2330   °[facility]  336-342-8273   °[facility]  336-703-3100   °[facility]  336-570-6415   ° °Behavioral Health Resources in the Community: °Intensive Outpatient Programs °Organization         Address  Phone  Notes  °[place]  [facility] [address], [place], [place] 336-878-6098   °[facility] [address], [place], [place] 336-832-9800   °[facility] [address], [place], [place] ° 336-882-2125   °[facility] [address],  °[place], [place] 1-800-853-5163 or 336-641-4981   °Substance Abuse Resources °Organization         Address  Phone  Notes  °[facility]  336-882-2125   °[facility]  336-784-9470   °[facility]  336-285-9073   °[facility]  336-845-3988   °Residential & Outpatient Substance Abuse Program  1-800-659-3381   °Psychological Services °Organization         Address  Phone  Notes  °[facility]  336- 832-9600   °[facility]  336- 378-7881   °[facility] [address], [place] 1-800-853-5163 or 336-641-4981   ° °Mobile Crisis Teams °Organization         Address  Phone  Notes  °[facility], Mobile Crisis Care Unit  1-877-626-1772   °[facility]ive °[facility] ° [address] [place], [place] 336-834-9664   °Sharon DeEsch [address] °[place] 336-554-5454   ° °Self-Help/Support Groups °Organization         Address  Phone             Notes  °[facility] [place] - variety of support groups  336- 373-1402 Call for more information  °Narcotics Anonymous (NA), [facility] [address], °[place]  2 meetings at this location  ° °Residential Treatment Programs °Organization         Address  Phone  Notes  °[facility] [address],    °[place]  1-866-801-8205   °[facility] ° [address], [place], [place] 704-293-8524   °[facility] [address], [place] 336-845-3988 Admissions: 8am-3pm M-F  °[facility] [address]    °[place], [place] 336-841-1104   °[facility] [address],  [place], [place] 336-379-7146   °[facility] [address]  °,  336-285-9073   °[facility] Intensive Outpatient [address], ,   [place] 336-852-3033   °[facility] [address]  °[place], [place] 1-877-615-2722 or 336-784-9470   °[facility] [address] [place], [place] 336-227-7417 Accepts Medicaid  °[facility] [address]  °[place] 1-800-659-3381 Substance Abuse/Addiction Treatment  ° °[place] Behavioral Health Resources °Organization         Address  Phone  Notes  °[facility]  (888) 581-9988   °Julie Brannon, PhD [address] [place], [place]   (336) 349-5553 or (336) 951-0000   °[facility]   [address] °[place], [place] (336) 349-4454   °[facility] [address], [place], [place] (336) 342-8316 Insurance/Medicaid/sponsorship through [facility]  °[facility] [address]                                    [place], [place] (336) 342-8316 Therapy/tele-psych/case  °[facility] [address]  ° [place], [place] (336) 349-2233    °Dr. Arfeen  (336) 349-4544   °[facility] [place]  [employer] [place] [facility] 1) [address], [place] °2) [address], [place] °3)  [address], [place] (336) 349-3220 °(336) 342-7768 ° °(336) 342-8140   °[place] [facility] (336) 342-1394 or (336) 342-3537 (After Hours)    ° ° °

## 2014-09-26 NOTE — ED Notes (Signed)
Pt. C/o insomnia. 

## 2014-10-02 MED ORDER — ALPRAZOLAM 1 MG PO TABS: ORAL_TABLET | ORAL | Status: DC

## 2014-10-02 NOTE — Telephone Encounter (Signed)
[phone]  Mother, Ms. [name] is calling for her daughters refill on.   Please call her when it is done.   ALPRAZolam (XANAX) 1 MG tablet

## 2014-10-02 NOTE — Telephone Encounter (Signed)
Ok to call in Have her f/u in next 3-4 weeks

## 2014-10-07 ENCOUNTER — Telehealth: Payer: Self-pay | Admitting: *Deleted

## 2014-10-07 NOTE — Telephone Encounter (Signed)
Faxed authorization health information requested with partial medical [facility], per Dr Merla Richesoolittle. Confirmation page received at 11:08 am.

## 2014-10-09 ENCOUNTER — Telehealth: Payer: Self-pay | Admitting: Internal Medicine

## 2014-10-09 NOTE — Telephone Encounter (Signed)
I could supply a list of the medications, but unsure of how long patient has been taking each medication as they requested.

## 2014-10-09 NOTE — Telephone Encounter (Signed)
i sent this all from 104 on Wednesday--staff faxed it in my presence and we got confirmation Just fax the last 3 office notes which should explain everything

## 2014-10-09 NOTE — Telephone Encounter (Signed)
I have faxed last three office notes to number given.

## 2014-10-09 NOTE — Telephone Encounter (Signed)
See letter faxed

## 2014-10-09 NOTE — Telephone Encounter (Signed)
Patient is calling because the office never received a fax from us. She states she's there now and they need and list of medication and how long she's been taking them. They can be faxed to the women's nursing unit. Fax #  [phone]

## 2014-10-09 NOTE — Telephone Encounter (Signed)
Patient has called multiple times today regarding information on her Xanax being sent to her rehab facility. Patient states that THE ONLY thing that needs to be faxed is documentation showing/saying that she has been taking Xanax for at least a year in order for the doctor at her rehab location to fill it for her. Patient states that we have faxed her office notes and that is not what they need. They just need to know that she has been on Xanax for 1 year. Can someone please fax this information to [phone] attn: Clydie BraunKaren.

## 2014-10-09 NOTE — Telephone Encounter (Signed)
Allison DraftsRobin Richard from [facility] called and wanted to verified her benzo Rx.  And wanted to make sure when she was discharged we would be willing to continue writing this.  They do not think that she is being release anytime soon but they wanted to make sure this was a possibility.

## 2014-10-21 ENCOUNTER — Ambulatory Visit: Admitting: Internal Medicine

## 2014-10-23 ENCOUNTER — Other Ambulatory Visit: Payer: Self-pay

## 2014-10-23 DIAGNOSIS — F411 Generalized anxiety disorder: Secondary | ICD-10-CM

## 2014-10-23 NOTE — Telephone Encounter (Signed)
Allison Richard called in because she's being discharged on 10/27/14 and going to a different facility in [place] later that day. She's stopping in [place] to pick up her things and move on so she wanted to know if she could have medication refills on all of her meds for at least a month until she gets situated in the new place. Wants Allison Richard to know that she has been taking all of her meds. Please leave message in chart to let us know what can be done. [facility] she can't get messages and if the message is left on her cell she won't get it until late tonight. She will call back to see is Dr. Merla Richard has read the message and left [place] a response as to what can be done for her.

## 2014-10-24 NOTE — Telephone Encounter (Signed)
The patient called to check status of medication refills. The patient stated that she is currently in rehab in [place], [place] and will be traveling to her half way house in [place], stopping to pack and pick up refills in [place] prior to going to [place]. The patient is requesting the refills be completed today so that she can take them with her to [place].  She is unable to receive phone calls due to being in rehab.

## 2014-10-24 NOTE — Telephone Encounter (Signed)
Patient called to clarify that she needs Lexapro,Seroquel, Xanax and Robaxin all refilled.  Patient will call back this afternoon to check in; she said she cannot receive phone calls because she is at rehab in [place].

## 2014-10-24 NOTE — Telephone Encounter (Signed)
?   Which meds: lexapro,seroquel and Xanax??? Sure can refill

## 2014-10-25 NOTE — Telephone Encounter (Signed)
Patient called again about her refills

## 2014-10-26 ENCOUNTER — Telehealth: Payer: Self-pay

## 2014-10-26 NOTE — Telephone Encounter (Signed)
Tell mom to call [facility] and ask--they know all the places

## 2014-10-26 NOTE — Telephone Encounter (Signed)
Please advise 

## 2014-10-26 NOTE — Telephone Encounter (Signed)
Mom wants to know where she can put her daughter for rehab.   She is being released from [facility].  Due to the drugs she has taken mom cannot find any where to get her into.   Purnell Shoemaker  [phone]

## 2014-10-27 MED ORDER — ALPRAZOLAM 1 MG PO TABS: ORAL_TABLET | ORAL | Status: DC

## 2014-10-27 MED ORDER — QUETIAPINE FUMARATE 200 MG PO TABS: 100.0000 mg | ORAL_TABLET | Freq: Every day | ORAL | Status: DC

## 2014-10-27 MED ORDER — METHOCARBAMOL 500 MG PO TABS: 500.0000 mg | ORAL_TABLET | Freq: Three times a day (TID) | ORAL | Status: DC | PRN

## 2014-10-27 MED ORDER — ESCITALOPRAM OXALATE 20 MG PO TABS: ORAL_TABLET | ORAL | Status: DC

## 2014-10-27 NOTE — Telephone Encounter (Signed)
Dr. Merla Riches, pt does not want [place] to discuss anything with her mother. She is supposed to be coming by to update her HIPPA form. Just FYI

## 2014-10-27 NOTE — Telephone Encounter (Signed)
Pt would like to pick up these medications. Can someone sign for Dr. Merla Riches? Rx's pended.

## 2014-10-27 NOTE — Telephone Encounter (Signed)
If patient needs a followup for drug treatment as an outpatient in [place], [facility]

## 2014-10-28 NOTE — Telephone Encounter (Signed)
Left message letting pt's mom know.

## 2014-10-30 ENCOUNTER — Encounter (HOSPITAL_COMMUNITY): Payer: Self-pay | Admitting: Emergency Medicine

## 2014-10-30 ENCOUNTER — Emergency Department (HOSPITAL_COMMUNITY)
Admission: EM | Admit: 2014-10-30 | Discharge: 2014-10-30 | Disposition: A | Discharge status: Home / Self Care | Attending: Emergency Medicine | Admitting: Emergency Medicine

## 2014-10-30 DIAGNOSIS — F410 Panic disorder [episodic paroxysmal anxiety] without agoraphobia: Secondary | ICD-10-CM | POA: Insufficient documentation

## 2014-10-30 DIAGNOSIS — R202 Paresthesia of skin: Secondary | ICD-10-CM | POA: Insufficient documentation

## 2014-10-30 DIAGNOSIS — Z720 Tobacco use: Secondary | ICD-10-CM | POA: Insufficient documentation

## 2014-10-30 DIAGNOSIS — Z7951 Long term (current) use of inhaled steroids: Secondary | ICD-10-CM | POA: Insufficient documentation

## 2014-10-30 DIAGNOSIS — Z79899 Other long term (current) drug therapy: Secondary | ICD-10-CM | POA: Insufficient documentation

## 2014-10-30 DIAGNOSIS — Z8669 Personal history of other diseases of the nervous system and sense organs: Secondary | ICD-10-CM | POA: Insufficient documentation

## 2014-10-30 DIAGNOSIS — M545 Low back pain: Secondary | ICD-10-CM | POA: Insufficient documentation

## 2014-10-30 DIAGNOSIS — F319 Bipolar disorder, unspecified: Secondary | ICD-10-CM | POA: Insufficient documentation

## 2014-10-30 DIAGNOSIS — Z87828 Personal history of other (healed) physical injury and trauma: Secondary | ICD-10-CM | POA: Insufficient documentation

## 2014-10-30 MED ORDER — METHYLPREDNISOLONE SODIUM SUCC 125 MG IJ SOLR
125.0000 mg | Freq: Once | INTRAMUSCULAR | Status: AC
  Administered 2014-10-30: 125 mg via INTRAMUSCULAR
  Filled 2014-10-30: qty 2

## 2014-10-30 MED ORDER — PREDNISONE 10 MG PO TABS: ORAL_TABLET | ORAL | Status: DC

## 2014-10-30 MED ORDER — KETOROLAC TROMETHAMINE 60 MG/2ML IM SOLN
60.0000 mg | Freq: Once | INTRAMUSCULAR | Status: AC
  Administered 2014-10-30: 60 mg via INTRAMUSCULAR
  Filled 2014-10-30: qty 2
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
## 2014-10-30 NOTE — ED Notes (Signed)
Pt A+ox4, reports low back pain "for years", reports normally takes robaxin and flexeril at home with relief, reports pain is shooting down legs and has no relief with medications.  Pt denies b/b changes or complaints.  Ambulatory with steady gait.  MAEI.  Speaking full/clear sentences, rr even/un-lab.  Skin PWD.  NAD.

## 2014-10-31 NOTE — Telephone Encounter (Signed)
I spoke with [place] and agreed that we would be willing to continue writing her medications after she was discharged.  She will be there for several weeks they just want to know for their planning.

## 2014-11-04 ENCOUNTER — Other Ambulatory Visit: Payer: Self-pay | Admitting: Urgent Care

## 2014-11-04 ENCOUNTER — Ambulatory Visit (INDEPENDENT_AMBULATORY_CARE_PROVIDER_SITE_OTHER): Admitting: Family Medicine

## 2014-11-04 ENCOUNTER — Ambulatory Visit (INDEPENDENT_AMBULATORY_CARE_PROVIDER_SITE_OTHER)

## 2014-11-04 VITALS — BP 128/82 | HR 116 | Temp 98.3°F | Resp 18 | Ht 65.0 in | Wt 133.0 lb

## 2014-11-04 DIAGNOSIS — R000 Tachycardia, unspecified: Secondary | ICD-10-CM

## 2014-11-04 DIAGNOSIS — R200 Anesthesia of skin: Secondary | ICD-10-CM

## 2014-11-04 DIAGNOSIS — R202 Paresthesia of skin: Secondary | ICD-10-CM | POA: Diagnosis not present

## 2014-11-04 DIAGNOSIS — R0789 Other chest pain: Secondary | ICD-10-CM

## 2014-11-04 DIAGNOSIS — F1990 Other psychoactive substance use, unspecified, uncomplicated: Secondary | ICD-10-CM

## 2014-11-04 DIAGNOSIS — F419 Anxiety disorder, unspecified: Secondary | ICD-10-CM | POA: Diagnosis not present

## 2014-11-04 DIAGNOSIS — R079 Chest pain, unspecified: Secondary | ICD-10-CM | POA: Insufficient documentation

## 2014-11-04 DIAGNOSIS — M545 Low back pain: Secondary | ICD-10-CM | POA: Diagnosis not present

## 2014-11-04 LAB — POCT CBC
GRANULOCYTE PERCENT: 45.4 % (ref 37–80)
HCT, POC: 42.9 % (ref 37.7–47.9)
Hemoglobin: 13.6 g/dL (ref 12.2–16.2)
Lymph, poc: 2.6 (ref 0.6–3.4)
MCH: 27.4 pg (ref 27–31.2)
MCHC: 31.8 g/dL (ref 31.8–35.4)
MCV: 86.1 fL (ref 80–97)
MID (cbc): 0.6 (ref 0–0.9)
MPV: 6.6 fL (ref 0–99.8)
PLATELET COUNT, POC: 261 10*3/uL (ref 142–424)
POC Granulocyte: 2.6 (ref 2–6.9)
POC LYMPH PERCENT: 44.1 %L (ref 10–50)
POC MID %: 10.5 %M (ref 0–12)
RBC: 4.98 M/uL (ref 4.04–5.48)
RDW, POC: 15 %
WBC: 5.8 10*3/uL (ref 4.6–10.2)

## 2014-11-04 LAB — GLUCOSE, POCT (MANUAL RESULT ENTRY): POC GLUCOSE: 74 mg/dL (ref 70–99)

## 2014-11-04 MED ORDER — KETOROLAC TROMETHAMINE 60 MG/2ML IM SOLN
60.0000 mg | Freq: Once | INTRAMUSCULAR | Status: AC
Start: 2014-11-04 — End: 2014-11-04
  Administered 2014-11-04: 60 mg via INTRAMUSCULAR

## 2014-11-04 MED ORDER — CYCLOBENZAPRINE HCL 10 MG PO TABS: 10.0000 mg | ORAL_TABLET | Freq: Three times a day (TID) | ORAL | Status: DC | PRN

## 2014-11-04 MED ORDER — ESCITALOPRAM OXALATE 20 MG PO TABS: ORAL_TABLET | ORAL | Status: DC

## 2014-11-04 MED ORDER — QUETIAPINE FUMARATE 200 MG PO TABS: 100.0000 mg | ORAL_TABLET | Freq: Every day | ORAL | Status: DC

## 2014-11-04 MED ORDER — DICLOFENAC SODIUM 1 % TD CREA: TOPICAL_CREAM | TRANSDERMAL | Status: DC

## 2014-11-04 MED ORDER — GABAPENTIN 300 MG PO CAPS: 300.0000 mg | ORAL_CAPSULE | Freq: Three times a day (TID) | ORAL | Status: DC

## 2014-11-04 MED ORDER — PROPRANOLOL HCL 10 MG PO TABS: 10.0000 mg | ORAL_TABLET | Freq: Three times a day (TID) | ORAL | Status: DC
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
## 2014-11-04 NOTE — Patient Instructions (Addendum)
Back pain  - Please keep your appointment with your orthopedist.  - For Gabapentin:  Day 1 - start with 1 pill  Day 2 - take 1 pill twice daily  Day 3 - take 1 pill three times daily  - For your back pain, apply 1 layer of diclofenac twice daily.

## 2014-11-05 ENCOUNTER — Encounter (HOSPITAL_COMMUNITY): Payer: Self-pay | Admitting: Emergency Medicine

## 2014-11-05 ENCOUNTER — Emergency Department (HOSPITAL_COMMUNITY)
Admission: EM | Admit: 2014-11-05 | Discharge: 2014-11-05 | Disposition: A | Discharge status: Home / Self Care | Attending: Emergency Medicine | Admitting: Emergency Medicine

## 2014-11-05 DIAGNOSIS — F410 Panic disorder [episodic paroxysmal anxiety] without agoraphobia: Secondary | ICD-10-CM | POA: Insufficient documentation

## 2014-11-05 DIAGNOSIS — T424X1A Poisoning by benzodiazepines, accidental (unintentional), initial encounter: Secondary | ICD-10-CM | POA: Insufficient documentation

## 2014-11-05 DIAGNOSIS — R079 Chest pain, unspecified: Secondary | ICD-10-CM | POA: Insufficient documentation

## 2014-11-05 DIAGNOSIS — Z79899 Other long term (current) drug therapy: Secondary | ICD-10-CM | POA: Insufficient documentation

## 2014-11-05 DIAGNOSIS — Y998 Other external cause status: Secondary | ICD-10-CM | POA: Insufficient documentation

## 2014-11-05 DIAGNOSIS — R531 Weakness: Secondary | ICD-10-CM | POA: Insufficient documentation

## 2014-11-05 DIAGNOSIS — Z720 Tobacco use: Secondary | ICD-10-CM | POA: Insufficient documentation

## 2014-11-05 DIAGNOSIS — T5791XA Toxic effect of unspecified inorganic substance, accidental (unintentional), initial encounter: Secondary | ICD-10-CM

## 2014-11-05 DIAGNOSIS — Y9289 Other specified places as the place of occurrence of the external cause: Secondary | ICD-10-CM | POA: Insufficient documentation

## 2014-11-05 DIAGNOSIS — F319 Bipolar disorder, unspecified: Secondary | ICD-10-CM | POA: Insufficient documentation

## 2014-11-05 DIAGNOSIS — Y9389 Activity, other specified: Secondary | ICD-10-CM | POA: Insufficient documentation

## 2014-11-05 LAB — COMPREHENSIVE METABOLIC PANEL
ALK PHOS: 65 U/L (ref 33–115)
ALT: 15 U/L (ref 6–29)
AST: 17 U/L (ref 10–30)
Albumin: 4.4 g/dL (ref 3.6–5.1)
BUN: 13 mg/dL (ref 7–25)
CO2: 27 mmol/L (ref 20–31)
Calcium: 9.4 mg/dL (ref 8.6–10.2)
Chloride: 104 mmol/L (ref 98–110)
Creat: 0.85 mg/dL (ref 0.50–1.10)
Glucose, Bld: 84 mg/dL (ref 65–99)
Potassium: 4.5 mmol/L (ref 3.5–5.3)
SODIUM: 140 mmol/L (ref 135–146)
TOTAL PROTEIN: 7.1 g/dL (ref 6.1–8.1)
Total Bilirubin: 0.3 mg/dL (ref 0.2–1.2)

## 2014-11-05 LAB — CBC
HCT: 36 % (ref 36.0–46.0)
Hemoglobin: 12 g/dL (ref 12.0–15.0)
MCH: 28.8 pg (ref 26.0–34.0)
MCHC: 33.3 g/dL (ref 30.0–36.0)
MCV: 86.5 fL (ref 78.0–100.0)
Platelets: 168 10*3/uL (ref 150–400)
RBC: 4.16 MIL/uL (ref 3.87–5.11)
RDW: 13.6 % (ref 11.5–15.5)
WBC: 4.4 10*3/uL (ref 4.0–10.5)

## 2014-11-05 LAB — RAPID URINE DRUG SCREEN, HOSP PERFORMED
Amphetamines: NOT DETECTED
BARBITURATES: NOT DETECTED
Benzodiazepines: POSITIVE — AB
Cocaine: NOT DETECTED
OPIATES: NOT DETECTED
Tetrahydrocannabinol: NOT DETECTED

## 2014-11-05 LAB — BASIC METABOLIC PANEL
Anion gap: 5 (ref 5–15)
BUN: 15 mg/dL (ref 6–20)
CO2: 27 mmol/L (ref 22–32)
Calcium: 8.8 mg/dL — ABNORMAL LOW (ref 8.9–10.3)
Chloride: 107 mmol/L (ref 101–111)
Creatinine, Ser: 0.53 mg/dL (ref 0.44–1.00)
GFR calc Af Amer: >60 mL/min (ref >60)
GLUCOSE: 88 mg/dL (ref 65–99)
POTASSIUM: 4.2 mmol/L (ref 3.5–5.1)
Sodium: 139 mmol/L (ref 135–145)

## 2014-11-05 LAB — HEPATITIS B SURFACE ANTIGEN: Hepatitis B Surface Ag: NEGATIVE

## 2014-11-05 LAB — HIV ANTIBODY (ROUTINE TESTING W REFLEX): HIV: NONREACTIVE

## 2014-11-05 LAB — HEPATITIS C ANTIBODY: HCV AB: NEGATIVE

## 2014-11-05 LAB — SALICYLATE LEVEL: Salicylate Lvl: <4 mg/dL (ref 2.8–30.0)

## 2014-11-05 LAB — ACETAMINOPHEN LEVEL

## 2014-11-05 MED ORDER — DIPHENHYDRAMINE HCL 25 MG PO CAPS
50.0000 mg | ORAL_CAPSULE | Freq: Once | ORAL | Status: AC
  Administered 2014-11-05: 50 mg via ORAL
  Filled 2014-11-05: qty 2

## 2014-11-05 NOTE — ED Notes (Signed)
Pt added shortness of breath and l/to midsternal chest pain to her symptoms
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
## 2014-11-05 NOTE — Progress Notes (Signed)
CSW was consulted to speak with patient regarding transportation issues.  CSW met with patient at bedside. Patient states that at this time she has transportation. Patient states that a family member is coming to pick her up upon discharge.   CSW gave the patient resources for outpatient treatment centers. Patient is appreciative for resources. Patient states that she does not have any questions for CSW at this time.  Willette Brace [phone] ED CSW 11/05/2014 5:10 PM

## 2014-11-05 NOTE — ED Notes (Signed)
PER POISON CONTROL: Revonda Standard RN  Pt may be very sleepy for approx. 4 hours after last ingestion of Xanax (1300) VS reviewed. EKG completed

## 2014-11-05 NOTE — ED Notes (Signed)
Pt reports that she took 3 Xanax at 0800 and 4 more at 1300. Stated that she forgot that she had taken the earlier dosage. Denies SI or HI. Referred to ED by PCP when she called them. Pt is oriented and appropriate, c/o shortness of breath and dizziness

## 2014-11-05 NOTE — ED Notes (Signed)
Pt reports, "I'm just getting anxious like I'm about to have a bad panic attack. This IV in my arm is making me feel like I want to just walk to my dealer's house and hit up again but I know I don't need to do that." Pt appears anxious. MD aware.
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
## 2014-11-05 NOTE — ED Notes (Signed)
Spoke with [facility] with [employer]. Updated on all VS and lab values. No further recommendations.

## 2014-11-06 LAB — RPR

## 2014-11-06 NOTE — Progress Notes (Signed)
I saw and spoke with the patient. Agree with a/p. Dr Conley Rolls

## 2014-11-10 ENCOUNTER — Ambulatory Visit (INDEPENDENT_AMBULATORY_CARE_PROVIDER_SITE_OTHER): Admitting: Internal Medicine

## 2014-11-10 VITALS — BP 120/66 | HR 86 | Temp 99.6°F | Resp 16 | Ht 65.5 in | Wt 134.2 lb

## 2014-11-10 DIAGNOSIS — R569 Unspecified convulsions: Secondary | ICD-10-CM

## 2014-11-10 DIAGNOSIS — R0789 Other chest pain: Secondary | ICD-10-CM | POA: Diagnosis not present

## 2014-11-10 DIAGNOSIS — M545 Low back pain: Secondary | ICD-10-CM | POA: Diagnosis not present

## 2014-11-10 DIAGNOSIS — F1110 Opioid abuse, uncomplicated: Secondary | ICD-10-CM

## 2014-11-10 DIAGNOSIS — F419 Anxiety disorder, unspecified: Secondary | ICD-10-CM | POA: Diagnosis not present

## 2014-11-10 DIAGNOSIS — F1190 Opioid use, unspecified, uncomplicated: Secondary | ICD-10-CM

## 2014-11-10 MED ORDER — CLONAZEPAM 2 MG PO TABS: 2.0000 mg | ORAL_TABLET | Freq: Three times a day (TID) | ORAL | Status: DC

## 2014-11-10 MED ORDER — MELOXICAM 15 MG PO TABS: 15.0000 mg | ORAL_TABLET | Freq: Every day | ORAL | Status: DC
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
## 2014-11-10 NOTE — Patient Instructions (Signed)
[facility] on [address]  meds Lexapro/seroquel 200,klonopin2 3x a day,robaxin,mobic

## 2014-11-13 ENCOUNTER — Encounter (HOSPITAL_COMMUNITY): Payer: Self-pay | Admitting: Emergency Medicine

## 2014-11-13 ENCOUNTER — Emergency Department (HOSPITAL_COMMUNITY)
Admission: EM | Admit: 2014-11-13 | Discharge: 2014-11-13 | Disposition: A | Discharge status: Home / Self Care | Attending: Emergency Medicine | Admitting: Emergency Medicine

## 2014-11-13 DIAGNOSIS — M7981 Nontraumatic hematoma of soft tissue: Secondary | ICD-10-CM | POA: Diagnosis not present

## 2014-11-13 DIAGNOSIS — T148XXA Other injury of unspecified body region, initial encounter: Secondary | ICD-10-CM

## 2014-11-13 DIAGNOSIS — F410 Panic disorder [episodic paroxysmal anxiety] without agoraphobia: Secondary | ICD-10-CM | POA: Diagnosis not present

## 2014-11-13 DIAGNOSIS — W260XXA Contact with knife, initial encounter: Secondary | ICD-10-CM | POA: Diagnosis not present

## 2014-11-13 DIAGNOSIS — Y998 Other external cause status: Secondary | ICD-10-CM | POA: Insufficient documentation

## 2014-11-13 DIAGNOSIS — Y9389 Activity, other specified: Secondary | ICD-10-CM | POA: Diagnosis not present

## 2014-11-13 DIAGNOSIS — S70311A Abrasion, right thigh, initial encounter: Secondary | ICD-10-CM | POA: Insufficient documentation

## 2014-11-13 DIAGNOSIS — Y9289 Other specified places as the place of occurrence of the external cause: Secondary | ICD-10-CM | POA: Insufficient documentation

## 2014-11-13 DIAGNOSIS — Z79899 Other long term (current) drug therapy: Secondary | ICD-10-CM | POA: Insufficient documentation

## 2014-11-13 DIAGNOSIS — S79921A Unspecified injury of right thigh, initial encounter: Secondary | ICD-10-CM | POA: Diagnosis present

## 2014-11-13 DIAGNOSIS — Z720 Tobacco use: Secondary | ICD-10-CM | POA: Diagnosis not present

## 2014-11-13 DIAGNOSIS — F319 Bipolar disorder, unspecified: Secondary | ICD-10-CM | POA: Diagnosis not present

## 2014-11-13 DIAGNOSIS — Z8669 Personal history of other diseases of the nervous system and sense organs: Secondary | ICD-10-CM | POA: Insufficient documentation
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
## 2014-11-13 NOTE — Discharge Instructions (Signed)
Abrasions An abrasion is a cut or scrape of the skin. Abrasions do not go through all layers of the skin. HOME CARE  If a bandage (dressing) was put on your wound, change it as told by your doctor. If the bandage sticks, soak it off with warm.  Wash the area with water and soap 2 times a day. Rinse off the soap. Pat the area dry with a clean towel.  Put on medicated cream (ointment) as told by your doctor.  Change your bandage right away if it gets wet or dirty.  Only take medicine as told by your doctor.  See your doctor within 24-48 hours to get your wound checked.  Check your wound for redness, puffiness (swelling), or yellowish-white fluid (pus). GET HELP RIGHT AWAY IF:   You have more pain in the wound.  You have redness, swelling, or tenderness around the wound.  You have pus coming from the wound.  You have a fever or lasting symptoms for more than 2-3 days.  You have a fever and your symptoms suddenly get worse.  You have a bad smell coming from the wound or bandage. MAKE SURE YOU:   Understand these instructions.  Will watch your condition.  Will get help right away if you are not doing well or get worse. Document Released: 08/30/2007 Document Revised: 12/06/2011 Document Reviewed: 02/14/2011 [facility] Patient Information 2015 [place], [place]. This information is not intended to replace advice given to you by your health care provider. Make sure you discuss any questions you have with your health care provider. You can take over-the-counter ibuprofen for discomfort.  Can also use heat or ice to the area to help your body absorb the superficial blood that has collected causing the bruising

## 2014-11-13 NOTE — ED Notes (Signed)
Pt has bruising to right anterior thigh with approximately 3" laceration to same. Reports "I cut it to try to relieve the pressure". Wound is firm to touch. Denies fever at home.

## 2014-11-25 ENCOUNTER — Emergency Department (HOSPITAL_COMMUNITY)
Admission: EM | Admit: 2014-11-25 | Discharge: 2014-11-26 | Disposition: A | Discharge status: Home / Self Care | Attending: Emergency Medicine | Admitting: Emergency Medicine

## 2014-11-25 ENCOUNTER — Ambulatory Visit (INDEPENDENT_AMBULATORY_CARE_PROVIDER_SITE_OTHER): Admitting: Internal Medicine

## 2014-11-25 ENCOUNTER — Encounter (HOSPITAL_COMMUNITY): Payer: Self-pay | Admitting: Nurse Practitioner

## 2014-11-25 ENCOUNTER — Encounter: Payer: Self-pay | Admitting: Internal Medicine

## 2014-11-25 VITALS — BP 111/72 | HR 83 | Temp 98.8°F | Resp 16 | Ht 64.0 in | Wt 135.0 lb

## 2014-11-25 DIAGNOSIS — Z720 Tobacco use: Secondary | ICD-10-CM | POA: Diagnosis not present

## 2014-11-25 DIAGNOSIS — F1193 Opioid use, unspecified with withdrawal: Secondary | ICD-10-CM

## 2014-11-25 DIAGNOSIS — Z87828 Personal history of other (healed) physical injury and trauma: Secondary | ICD-10-CM | POA: Diagnosis not present

## 2014-11-25 DIAGNOSIS — R1030 Lower abdominal pain, unspecified: Secondary | ICD-10-CM | POA: Insufficient documentation

## 2014-11-25 DIAGNOSIS — R55 Syncope and collapse: Secondary | ICD-10-CM | POA: Insufficient documentation

## 2014-11-25 DIAGNOSIS — Z3202 Encounter for pregnancy test, result negative: Secondary | ICD-10-CM | POA: Diagnosis not present

## 2014-11-25 DIAGNOSIS — F1123 Opioid dependence with withdrawal: Secondary | ICD-10-CM | POA: Diagnosis not present

## 2014-11-25 DIAGNOSIS — F1190 Opioid use, unspecified, uncomplicated: Secondary | ICD-10-CM

## 2014-11-25 DIAGNOSIS — Z23 Encounter for immunization: Secondary | ICD-10-CM

## 2014-11-25 DIAGNOSIS — F411 Generalized anxiety disorder: Secondary | ICD-10-CM

## 2014-11-25 DIAGNOSIS — R112 Nausea with vomiting, unspecified: Secondary | ICD-10-CM | POA: Insufficient documentation

## 2014-11-25 DIAGNOSIS — R350 Frequency of micturition: Secondary | ICD-10-CM | POA: Insufficient documentation

## 2014-11-25 DIAGNOSIS — F418 Other specified anxiety disorders: Secondary | ICD-10-CM | POA: Diagnosis not present

## 2014-11-25 DIAGNOSIS — Z8669 Personal history of other diseases of the nervous system and sense organs: Secondary | ICD-10-CM | POA: Diagnosis not present

## 2014-11-25 DIAGNOSIS — Z8659 Personal history of other mental and behavioral disorders: Secondary | ICD-10-CM | POA: Insufficient documentation

## 2014-11-25 DIAGNOSIS — F1110 Opioid abuse, uncomplicated: Secondary | ICD-10-CM

## 2014-11-25 LAB — CBC WITH DIFFERENTIAL/PLATELET
Basophils Absolute: 0 10*3/uL (ref 0.0–0.1)
Basophils Relative: 0 % (ref 0–1)
EOS PCT: 4 % (ref 0–5)
Eosinophils Absolute: 0.2 10*3/uL (ref 0.0–0.7)
HCT: 37.6 % (ref 36.0–46.0)
Hemoglobin: 12.5 g/dL (ref 12.0–15.0)
LYMPHS ABS: 1.9 10*3/uL (ref 0.7–4.0)
LYMPHS PCT: 34 % (ref 12–46)
MCH: 29 pg (ref 26.0–34.0)
MCHC: 33.2 g/dL (ref 30.0–36.0)
MCV: 87.2 fL (ref 78.0–100.0)
MONO ABS: 0.5 10*3/uL (ref 0.1–1.0)
Monocytes Relative: 10 % (ref 3–12)
Neutro Abs: 3 10*3/uL (ref 1.7–7.7)
Neutrophils Relative %: 52 % (ref 43–77)
PLATELETS: 225 10*3/uL (ref 150–400)
RBC: 4.31 MIL/uL (ref 3.87–5.11)
RDW: 13.5 % (ref 11.5–15.5)
WBC: 5.6 10*3/uL (ref 4.0–10.5)

## 2014-11-25 MED ORDER — HYDROXYZINE PAMOATE 50 MG PO CAPS: 50.0000 mg | ORAL_CAPSULE | Freq: Four times a day (QID) | ORAL | Status: DC | PRN

## 2014-11-25 MED ORDER — SODIUM CHLORIDE 0.9 % IV BOLUS (SEPSIS)
1000.0000 mL | Freq: Once | INTRAVENOUS | Status: AC
  Administered 2014-11-25: 1000 mL via INTRAVENOUS

## 2014-11-25 MED ORDER — ALPRAZOLAM 1 MG PO TABS: ORAL_TABLET | ORAL | Status: DC

## 2014-11-25 MED ORDER — ONDANSETRON HCL 8 MG PO TABS: 8.0000 mg | ORAL_TABLET | Freq: Three times a day (TID) | ORAL | Status: DC | PRN

## 2014-11-25 MED ORDER — ONDANSETRON HCL 4 MG/2ML IJ SOLN
4.0000 mg | Freq: Once | INTRAMUSCULAR | Status: AC
  Administered 2014-11-25: 4 mg via INTRAVENOUS
  Filled 2014-11-25: qty 2

## 2014-11-25 MED ORDER — ALPRAZOLAM 0.5 MG PO TABS: ORAL_TABLET | ORAL | Status: DC

## 2014-11-25 MED ORDER — ONDANSETRON 4 MG PO TBDP
8.0000 mg | ORAL_TABLET | Freq: Once | ORAL | Status: AC
  Administered 2014-11-25: 8 mg via ORAL

## 2014-11-25 NOTE — ED Notes (Signed)
Pt from home. Pt states her doctor has changed her medication and she has had 5 episodes of emesis and has "passed out 2 times" today. Pt stated she hit her head when she passed out in the waiting room. There is no bump or evidence of injury to pt.
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
## 2014-11-25 NOTE — ED Notes (Signed)
Pt states she is being taken off being benzos and itr is causing her to have "seizures" and has been" throwing up all day.

## 2014-11-26 ENCOUNTER — Encounter: Payer: Self-pay | Admitting: Internal Medicine

## 2014-11-26 DIAGNOSIS — F1190 Opioid use, unspecified, uncomplicated: Secondary | ICD-10-CM | POA: Insufficient documentation

## 2014-11-26 LAB — COMPREHENSIVE METABOLIC PANEL
ALK PHOS: 46 U/L (ref 38–126)
ALT: 14 U/L (ref 14–54)
AST: 18 U/L (ref 15–41)
Albumin: 4.3 g/dL (ref 3.5–5.0)
Anion gap: 6 (ref 5–15)
BILIRUBIN TOTAL: 0.8 mg/dL (ref 0.3–1.2)
BUN: 8 mg/dL (ref 6–20)
CALCIUM: 9.1 mg/dL (ref 8.9–10.3)
CO2: 29 mmol/L (ref 22–32)
CREATININE: 0.93 mg/dL (ref 0.44–1.00)
Chloride: 106 mmol/L (ref 101–111)
Glucose, Bld: 95 mg/dL (ref 65–99)
Potassium: 3.9 mmol/L (ref 3.5–5.1)
Sodium: 141 mmol/L (ref 135–145)
TOTAL PROTEIN: 7.2 g/dL (ref 6.5–8.1)

## 2014-11-26 LAB — URINALYSIS, ROUTINE W REFLEX MICROSCOPIC
BILIRUBIN URINE: NEGATIVE
Glucose, UA: NEGATIVE mg/dL
HGB URINE DIPSTICK: NEGATIVE
KETONES UR: NEGATIVE mg/dL
Leukocytes, UA: NEGATIVE
Nitrite: NEGATIVE
PROTEIN: NEGATIVE mg/dL
SPECIFIC GRAVITY, URINE: 1.008 (ref 1.005–1.030)
UROBILINOGEN UA: 1 mg/dL (ref 0.0–1.0)
pH: 7.5 (ref 5.0–8.0)

## 2014-11-26 LAB — PREGNANCY, URINE: Preg Test, Ur: NEGATIVE

## 2014-11-26 MED ORDER — ONDANSETRON HCL 4 MG PO TABS: 4.0000 mg | ORAL_TABLET | Freq: Three times a day (TID) | ORAL | Status: DC | PRN

## 2014-11-26 MED ORDER — ONDANSETRON HCL 4 MG/2ML IJ SOLN
4.0000 mg | Freq: Once | INTRAMUSCULAR | Status: AC
  Administered 2014-11-26: 4 mg via INTRAVENOUS
  Filled 2014-11-26: qty 2

## 2014-11-26 NOTE — ED Notes (Signed)
Pt stating her back is now hurting her and is requesting pain medication.

## 2014-11-26 NOTE — Discharge Instructions (Signed)
Nausea and Vomiting °Nausea is a sick feeling that often comes before throwing up (vomiting). Vomiting is a reflex where stomach contents come out of your mouth. Vomiting can cause severe loss of body fluids (dehydration). Children and elderly adults can become dehydrated quickly, especially if they also have diarrhea. Nausea and vomiting are symptoms of a condition or disease. It is important to find the cause of your symptoms. °CAUSES  °· Direct irritation of the stomach lining. This irritation can result from increased acid production (gastroesophageal reflux disease), infection, food poisoning, taking certain medicines (such as nonsteroidal anti-inflammatory drugs), alcohol use, or tobacco use. °· Signals from the brain. These signals could be caused by a headache, heat exposure, an inner ear disturbance, increased pressure in the brain from injury, infection, a tumor, or a concussion, pain, emotional stimulus, or metabolic problems. °· An obstruction in the gastrointestinal tract (bowel obstruction). °· Illnesses such as diabetes, hepatitis, gallbladder problems, appendicitis, kidney problems, cancer, sepsis, atypical symptoms of a heart attack, or eating disorders. °· Medical treatments such as chemotherapy and radiation. °· Receiving medicine that makes you sleep (general anesthetic) during surgery. °DIAGNOSIS °Your caregiver may ask for tests to be done if the problems do not improve after a few days. Tests may also be done if symptoms are severe or if the reason for the nausea and vomiting is not clear. Tests may include: °· Urine tests. °· Blood tests. °· Stool tests. °· Cultures (to look for evidence of infection). °· X-rays or other imaging studies. °Test results can help your caregiver make decisions about treatment or the need for additional tests. °TREATMENT °You need to stay well hydrated. Drink frequently but in small amounts. You may wish to drink water, sports drinks, clear broth, or eat frozen  ice pops or gelatin dessert to help stay hydrated. When you eat, eating slowly may help prevent nausea. There are also some antinausea medicines that may help prevent nausea. °HOME CARE INSTRUCTIONS  °· Take all medicine as directed by your caregiver. °· If you do not have an appetite, do not force yourself to eat. However, you must continue to drink fluids. °· If you have an appetite, eat a normal diet unless your caregiver tells you differently. °· Eat a variety of complex carbohydrates (rice, wheat, potatoes, bread), lean meats, yogurt, fruits, and vegetables. °· Avoid high-fat foods because they are more difficult to digest. °· Drink enough water and fluids to keep your urine clear or pale yellow. °· If you are dehydrated, ask your caregiver for specific rehydration instructions. Signs of dehydration may include: °· Severe thirst. °· Dry lips and mouth. °· Dizziness. °· Dark urine. °· Decreasing urine frequency and amount. °· Confusion. °· Rapid breathing or pulse. °SEEK IMMEDIATE MEDICAL CARE IF:  °· You have blood or brown flecks (like coffee grounds) in your vomit. °· You have black or bloody stools. °· You have a severe headache or stiff neck. °· You are confused. °· You have severe abdominal pain. °· You have chest pain or trouble breathing. °· You do not urinate at least once every 8 hours. °· You develop cold or clammy skin. °· You continue to vomit for longer than 24 to 48 hours. °· You have a fever. °MAKE SURE YOU:  °· Understand these instructions. °· Will watch your condition. °· Will get help right away if you are not doing well or get worse. °Document Released: 03/13/2005 Document Revised: 06/05/2011 Document Reviewed: 08/10/2010 °ExitCare® Patient Information ©2015 [employer]. This information is not intended   to replace advice given to you by your health care provider. Make sure you discuss any questions you have with your health care provider. ° °Syncope °Syncope is a medical term for fainting  or passing out. This means you lose consciousness and drop to the ground. People are generally unconscious for less than 5 minutes. You may have some muscle twitches for up to 15 seconds before waking up and returning to normal. Syncope occurs more often in older adults, but it can happen to anyone. While most causes of syncope are not dangerous, syncope can be a sign of a serious medical problem. It is important to seek medical care.  °CAUSES  °Syncope is caused by a sudden drop in blood flow to the brain. The specific cause is often not determined. Factors that can bring on syncope include: °· Taking medicines that lower blood pressure. °· Sudden changes in posture, such as standing up quickly. °· Taking more medicine than prescribed. °· Standing in one place for too long. °· Seizure disorders. °· Dehydration and excessive exposure to heat. °· Low blood sugar (hypoglycemia). °· Straining to have a bowel movement. °· Heart disease, irregular heartbeat, or other circulatory problems. °· Fear, emotional distress, seeing blood, or severe pain. °SYMPTOMS  °Right before fainting, you may: °· Feel dizzy or light-headed. °· Feel nauseous. °· See all white or all black in your field of vision. °· Have cold, clammy skin. °DIAGNOSIS  °Your health care provider will ask about your symptoms, perform a physical exam, and perform an electrocardiogram (ECG) to record the electrical activity of your heart. Your health care provider may also perform other heart or blood tests to determine the cause of your syncope which may include: °· Transthoracic echocardiogram (TTE). During echocardiography, sound waves are used to evaluate how blood flows through your heart. °· Transesophageal echocardiogram (TEE). °· Cardiac monitoring. This allows your health care provider to monitor your heart rate and rhythm in real time. °· Holter monitor. This is a portable device that records your heartbeat and can help diagnose heart arrhythmias. It  allows your health care provider to track your heart activity for several days, if needed. °· Stress tests by exercise or by giving medicine that makes the heart beat faster. °TREATMENT  °In most cases, no treatment is needed. Depending on the cause of your syncope, your health care provider may recommend changing or stopping some of your medicines. °HOME CARE INSTRUCTIONS °· Have someone stay with you until you feel stable. °· Do not drive, use machinery, or play sports until your health care provider says it is okay. °· Keep all follow-up appointments as directed by your health care provider. °· Lie down right away if you start feeling like you might faint. Breathe deeply and steadily. Wait until all the symptoms have passed. °· Drink enough fluids to keep your urine clear or pale yellow. °· If you are taking blood pressure or heart medicine, get up slowly and take several minutes to sit and then stand. This can reduce dizziness. °SEEK IMMEDIATE MEDICAL CARE IF:  °· You have a severe headache. °· You have unusual pain in the chest, abdomen, or back. °· You are bleeding from your mouth or rectum, or you have black or tarry stool. °· You have an irregular or very fast heartbeat. °· You have pain with breathing. °· You have repeated fainting or seizure-like jerking during an episode. °· You faint when sitting or lying down. °· You have confusion. °· You have   trouble walking. °· You have severe weakness. °· You have vision problems. °If you fainted, call your local emergency services (911 in [place] Do not drive yourself to the hospital.  °MAKE SURE YOU: °· Understand these instructions. °· Will watch your condition. °· Will get help right away if you are not doing well or get worse. °Document Released: 03/13/2005 Document Revised: 03/18/2013 Document Reviewed: 05/12/2011 °ExitCare® Patient Information ©2015 [employer]. This information is not intended to replace advice given to you by your health care provider.  Make sure you discuss any questions you have with your health care provider. ° °
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
## 2014-12-02 ENCOUNTER — Telehealth: Payer: Self-pay

## 2014-12-02 NOTE — Telephone Encounter (Signed)
Pt called in wanting Dr. Merla Riches to give her a CB. She is having a hard time being taken off her anxiety medication. Things are really bad right now and she has decided to stay at home. Please advise at [phone]

## 2014-12-02 NOTE — Telephone Encounter (Signed)
Please check out for me--I can't call til tomorrow

## 2014-12-02 NOTE — Telephone Encounter (Signed)
Tried to call pt on # she left but VM was full. Called her # listed as mobile and LMOM that I was calling to check on her for Dr Merla Riches who could not call until tomorrow. Asked her to call me back or Clinical TL so that we can help or get a message to Dr Merla Riches.

## 2014-12-04 NOTE — Telephone Encounter (Signed)
Pt called in checking on this message. Please advise as soon as possible. [phone]

## 2014-12-06 ENCOUNTER — Ambulatory Visit (INDEPENDENT_AMBULATORY_CARE_PROVIDER_SITE_OTHER): Admitting: Family Medicine

## 2014-12-06 VITALS — BP 100/68 | HR 98 | Temp 99.1°F | Resp 18 | Ht 65.0 in | Wt 131.0 lb

## 2014-12-06 DIAGNOSIS — R0981 Nasal congestion: Secondary | ICD-10-CM

## 2014-12-06 DIAGNOSIS — R509 Fever, unspecified: Secondary | ICD-10-CM

## 2014-12-06 DIAGNOSIS — R5381 Other malaise: Secondary | ICD-10-CM | POA: Diagnosis not present

## 2014-12-06 LAB — POCT CBC
GRANULOCYTE PERCENT: 50.6 % (ref 37–80)
HEMATOCRIT: 42.1 % (ref 37.7–47.9)
Hemoglobin: 13.5 g/dL (ref 12.2–16.2)
Lymph, poc: 1.9 (ref 0.6–3.4)
MCH, POC: 27.4 pg (ref 27–31.2)
MCHC: 32 g/dL (ref 31.8–35.4)
MCV: 85.6 fL (ref 80–97)
MID (CBC): 0.6 (ref 0–0.9)
MPV: 6.8 fL (ref 0–99.8)
POC GRANULOCYTE: 2.6 (ref 2–6.9)
POC LYMPH %: 37.3 % (ref 10–50)
POC MID %: 12.1 % — AB (ref 0–12)
Platelet Count, POC: 274 10*3/uL (ref 142–424)
RBC: 4.92 M/uL (ref 4.04–5.48)
RDW, POC: 14.6 %
WBC: 5.1 10*3/uL (ref 4.6–10.2)
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
## 2014-12-06 NOTE — Patient Instructions (Signed)
Dr. Merla Riches will be in clinic tomorrow and Tuesday from 2pm until close if you would like to see him sooner than the 14th For the time being, you can stop the vistaril if you like I am sorry that you are having a hard time, but you have done a great thing staying off benzos for the last 10 days!    It appears that you have a viral illness- your blood count is normal.   Please give me a call if your sinus symptoms persist for more than 7- 10 days

## 2014-12-07 ENCOUNTER — Ambulatory Visit (INDEPENDENT_AMBULATORY_CARE_PROVIDER_SITE_OTHER): Admitting: Internal Medicine

## 2014-12-07 VITALS — BP 100/66 | HR 77 | Temp 98.1°F | Resp 16 | Ht 65.0 in | Wt 132.0 lb

## 2014-12-07 DIAGNOSIS — J0100 Acute maxillary sinusitis, unspecified: Secondary | ICD-10-CM | POA: Diagnosis not present

## 2014-12-07 DIAGNOSIS — F418 Other specified anxiety disorders: Secondary | ICD-10-CM

## 2014-12-07 DIAGNOSIS — F411 Generalized anxiety disorder: Secondary | ICD-10-CM | POA: Diagnosis not present

## 2014-12-07 DIAGNOSIS — G4700 Insomnia, unspecified: Secondary | ICD-10-CM | POA: Diagnosis not present

## 2014-12-07 MED ORDER — AMOXICILLIN 875 MG PO TABS: 875.0000 mg | ORAL_TABLET | Freq: Two times a day (BID) | ORAL | Status: DC

## 2014-12-07 MED ORDER — QUETIAPINE FUMARATE 300 MG PO TABS: 300.0000 mg | ORAL_TABLET | Freq: Every day | ORAL | Status: DC

## 2014-12-07 MED ORDER — FLUTICASONE PROPIONATE 50 MCG/ACT NA SUSP: NASAL | Status: DC

## 2014-12-07 MED ORDER — ALPRAZOLAM 0.5 MG PO TABS: 0.5000 mg | ORAL_TABLET | Freq: Three times a day (TID) | ORAL | Status: DC | PRN

## 2014-12-07 NOTE — Patient Instructions (Signed)
Change appt to 9/28
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
## 2014-12-09 ENCOUNTER — Ambulatory Visit: Admitting: Internal Medicine

## 2014-12-23 ENCOUNTER — Ambulatory Visit (INDEPENDENT_AMBULATORY_CARE_PROVIDER_SITE_OTHER): Admitting: Internal Medicine

## 2014-12-23 DIAGNOSIS — F1190 Opioid use, unspecified, uncomplicated: Secondary | ICD-10-CM

## 2014-12-23 DIAGNOSIS — R059 Cough, unspecified: Secondary | ICD-10-CM

## 2014-12-23 DIAGNOSIS — J4520 Mild intermittent asthma, uncomplicated: Secondary | ICD-10-CM

## 2014-12-23 DIAGNOSIS — R05 Cough: Secondary | ICD-10-CM | POA: Diagnosis not present

## 2014-12-23 DIAGNOSIS — F1110 Opioid abuse, uncomplicated: Secondary | ICD-10-CM

## 2014-12-23 DIAGNOSIS — F418 Other specified anxiety disorders: Secondary | ICD-10-CM

## 2014-12-23 DIAGNOSIS — F411 Generalized anxiety disorder: Secondary | ICD-10-CM

## 2014-12-23 DIAGNOSIS — G4700 Insomnia, unspecified: Secondary | ICD-10-CM

## 2014-12-23 MED ORDER — PREDNISONE 20 MG PO TABS: ORAL_TABLET | ORAL | Status: DC

## 2014-12-23 MED ORDER — ALPRAZOLAM 1 MG PO TABS: 1.0000 mg | ORAL_TABLET | Freq: Three times a day (TID) | ORAL | Status: DC | PRN

## 2014-12-23 MED ORDER — OXYCODONE-ACETAMINOPHEN 10-325 MG PO TABS: ORAL_TABLET | ORAL | Status: DC

## 2014-12-23 MED ORDER — ALBUTEROL SULFATE HFA 108 (90 BASE) MCG/ACT IN AERS: 2.0000 | INHALATION_SPRAY | Freq: Four times a day (QID) | RESPIRATORY_TRACT | Status: DC | PRN

## 2014-12-23 MED ORDER — AMOXICILLIN-POT CLAVULANATE 875-125 MG PO TABS: 1.0000 | ORAL_TABLET | Freq: Two times a day (BID) | ORAL | Status: DC

## 2014-12-23 NOTE — Progress Notes (Signed)
   Subjective:    Patient ID: Allison Richard, female    DOB: March 14, 1986, 29 y.o.   MRN: [id number]  HPIf/u Patient Active Problem List   Diagnosis Date Noted  . Heroin use--- none since last visit  11/26/2014  . Atypical chest pain--resolved  11/04/2014  . Seizures--- resolved  12/22/2013  . Anxiety state--continues to be her primary problem and uncontrolled anxiety drives her relapse to substance abuse --- her anxiety is controlled benzodiazepines  12/22/2013  . Depression--- multifactorial etiology but stable for the present  12/22/2013  . Depression with anxiety 12/08/2011  . Acne 12/08/2011  . Insomnia--- has improved with Seroquel  12/08/2011   Note last visit 12/07/2014 Over the last week she has been discovered to be pregnant despite Mirena First Mirena 7 years ago/second Mirena 2 years ago Dr. Jennette Kettle removed today which required local anesthesia She is currently  bleeding briskly with significant pelvic cramping pain despite Toradol injection and maximum doses of NSAIDs  Here with boyfriend-he notes psychological stability despite stress ors Parents have her child/she is living with boyfriend/he has good employment/gets along better with parents at a distance  Last treatment for upper and lower respiratory symptoms did not resolve her problems and she continues with a nocturnal cough and with episodic wheezing and continued nasal congestion. No fever or night sweats.   Review of Systems otherwise noncontributory      Objective:   Physical Exam No acute distress   HEENT-continues with boggy turbinates but scant mucus   lungs are clear to auscultation except very mild wheezing with forced expiration   her mood optimistic despite current stress ors   thought content normal Plans for the future are not delusional She is anxious but not overly depressed and not suicidal        Assessment & Plan:  GAD (generalized anxiety disorder) --Temporary increase in  benzodiazepines --Continue other medications Depression with anxiety --- Continue medications Insomnia --- Seroquel working Heroin use --- Stable for now Cough Reactive airway disease, mild intermittent, uncomplicated --- Augmentin/prednisone/albuterol  We discussed employability-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 ALPRAZolam (XANAX) 1 MG tablet    Sig: Take 1 tablet (1 mg total) by mouth 3 (three) times daily as needed for anxiety.    Dispense:  90 tablet    Refill:  0  . oxyCODONE-acetaminophen (PERCOCET) 10-325 MG tablet    Sig: One now and one at bedtime    Dispense:  2 tablet    Refill:  0  . predniSONE (DELTASONE) 20 MG tablet    Sig: 3/3/2/2/1/1 single daily dose for 6 days    Dispense:  12 tablet    Refill:  0  . amoxicillin-clavulanate (AUGMENTIN) 875-125 MG tablet    Sig: Take 1 tablet by mouth 2 (two) times daily.    Dispense:  20 tablet    Refill:  0  . albuterol (PROVENTIL HFA;VENTOLIN HFA) 108 (90 BASE) MCG/ACT inhaler    Sig: Inhale 2 puffs into the lungs every 6 (six) hours as needed for wheezing or shortness of breath.    Dispense:  1 Inhaler    Refill:  0   Follow-up one month

## 2014-12-27 ENCOUNTER — Encounter (HOSPITAL_COMMUNITY): Payer: Self-pay | Admitting: *Deleted

## 2014-12-27 ENCOUNTER — Inpatient Hospital Stay (HOSPITAL_COMMUNITY)
Admission: AD | Admit: 2014-12-27 | Discharge: 2014-12-28 | Disposition: A | Discharge status: Home / Self Care | Source: Ambulatory Visit | Attending: Obstetrics & Gynecology | Admitting: Obstetrics & Gynecology

## 2014-12-27 ENCOUNTER — Inpatient Hospital Stay (HOSPITAL_COMMUNITY)

## 2014-12-27 DIAGNOSIS — R1031 Right lower quadrant pain: Secondary | ICD-10-CM | POA: Insufficient documentation

## 2014-12-27 DIAGNOSIS — O26899 Other specified pregnancy related conditions, unspecified trimester: Secondary | ICD-10-CM

## 2014-12-27 DIAGNOSIS — O26891 Other specified pregnancy related conditions, first trimester: Secondary | ICD-10-CM

## 2014-12-27 DIAGNOSIS — R109 Unspecified abdominal pain: Secondary | ICD-10-CM

## 2014-12-27 DIAGNOSIS — R102 Pelvic and perineal pain: Secondary | ICD-10-CM

## 2014-12-27 LAB — URINALYSIS, ROUTINE W REFLEX MICROSCOPIC
BILIRUBIN URINE: NEGATIVE
Glucose, UA: NEGATIVE mg/dL
KETONES UR: NEGATIVE mg/dL
Leukocytes, UA: NEGATIVE
NITRITE: NEGATIVE
PROTEIN: NEGATIVE mg/dL
Specific Gravity, Urine: 1.025 (ref 1.005–1.030)
UROBILINOGEN UA: 0.2 mg/dL (ref 0.0–1.0)
pH: 6 (ref 5.0–8.0)

## 2014-12-27 LAB — URINE MICROSCOPIC-ADD ON

## 2014-12-27 LAB — CBC
HEMATOCRIT: 32.7 % — AB (ref 36.0–46.0)
HEMOGLOBIN: 11.2 g/dL — AB (ref 12.0–15.0)
MCH: 29.4 pg (ref 26.0–34.0)
MCHC: 34.3 g/dL (ref 30.0–36.0)
MCV: 85.8 fL (ref 78.0–100.0)
Platelets: 232 10*3/uL (ref 150–400)
RBC: 3.81 MIL/uL — AB (ref 3.87–5.11)
RDW: 13.6 % (ref 11.5–15.5)
WBC: 9.1 10*3/uL (ref 4.0–10.5)

## 2014-12-27 LAB — POCT PREGNANCY, URINE: PREG TEST UR: POSITIVE — AB

## 2014-12-27 LAB — HCG, QUANTITATIVE, PREGNANCY: hCG, Beta Chain, Quant, S: 101 m[IU]/mL — ABNORMAL HIGH (ref <5)

## 2014-12-27 MED ORDER — KETOROLAC TROMETHAMINE 60 MG/2ML IM SOLN
60.0000 mg | Freq: Once | INTRAMUSCULAR | Status: AC
  Administered 2014-12-28: 60 mg via INTRAMUSCULAR
  Filled 2014-12-27: qty 2
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
## 2014-12-27 NOTE — MAU Note (Signed)
Pt reports she is pregnant and the office removed her IUD on Thursday. States she started having severe lower abd and right side this pm. Denies bleeding.

## 2014-12-28 DIAGNOSIS — O26891 Other specified pregnancy related conditions, first trimester: Secondary | ICD-10-CM

## 2014-12-28 DIAGNOSIS — N949 Unspecified condition associated with female genital organs and menstrual cycle: Secondary | ICD-10-CM | POA: Diagnosis not present

## 2014-12-28 MED ORDER — ACYCLOVIR 200 MG PO CAPS
ORAL_CAPSULE | ORAL | Status: DC
Start: 2014-12-28 — End: 2015-03-10

## 2015-01-02 ENCOUNTER — Inpatient Hospital Stay (HOSPITAL_COMMUNITY)

## 2015-01-02 ENCOUNTER — Encounter (HOSPITAL_COMMUNITY): Payer: Self-pay | Admitting: *Deleted

## 2015-01-02 ENCOUNTER — Inpatient Hospital Stay (HOSPITAL_COMMUNITY)
Admission: AD | Admit: 2015-01-02 | Discharge: 2015-01-02 | Disposition: A | Discharge status: Home / Self Care | Source: Ambulatory Visit | Attending: Obstetrics and Gynecology | Admitting: Obstetrics and Gynecology

## 2015-01-02 DIAGNOSIS — Z3A01 Less than 8 weeks gestation of pregnancy: Secondary | ICD-10-CM | POA: Insufficient documentation

## 2015-01-02 DIAGNOSIS — O039 Complete or unspecified spontaneous abortion without complication: Secondary | ICD-10-CM | POA: Diagnosis not present

## 2015-01-02 DIAGNOSIS — R1031 Right lower quadrant pain: Secondary | ICD-10-CM | POA: Diagnosis present

## 2015-01-02 DIAGNOSIS — F319 Bipolar disorder, unspecified: Secondary | ICD-10-CM | POA: Insufficient documentation

## 2015-01-02 DIAGNOSIS — F419 Anxiety disorder, unspecified: Secondary | ICD-10-CM | POA: Insufficient documentation

## 2015-01-02 LAB — CBC
HEMATOCRIT: 41 % (ref 36.0–46.0)
Hemoglobin: 13.9 g/dL (ref 12.0–15.0)
MCH: 29.5 pg (ref 26.0–34.0)
MCHC: 33.9 g/dL (ref 30.0–36.0)
MCV: 87 fL (ref 78.0–100.0)
PLATELETS: 250 10*3/uL (ref 150–400)
RBC: 4.71 MIL/uL (ref 3.87–5.11)
RDW: 13.4 % (ref 11.5–15.5)
WBC: 5.8 10*3/uL (ref 4.0–10.5)

## 2015-01-02 LAB — HCG, QUANTITATIVE, PREGNANCY: hCG, Beta Chain, Quant, S: 19 m[IU]/mL — ABNORMAL HIGH (ref <5)

## 2015-01-02 MED ORDER — HYDROXYZINE HCL 50 MG/ML IM SOLN
25.0000 mg | Freq: Once | INTRAMUSCULAR | Status: AC
  Administered 2015-01-02: 25 mg via INTRAMUSCULAR
  Filled 2015-01-02: qty 0.5

## 2015-01-02 MED ORDER — HYDROMORPHONE HCL 1 MG/ML IJ SOLN
0.5000 mg | Freq: Once | INTRAMUSCULAR | Status: AC
  Administered 2015-01-02: 0.5 mg via INTRAMUSCULAR
  Filled 2015-01-02: qty 1

## 2015-01-02 MED ORDER — TRAMADOL HCL 50 MG PO TABS: 50.0000 mg | ORAL_TABLET | Freq: Four times a day (QID) | ORAL | Status: DC | PRN

## 2015-01-02 MED ORDER — PROMETHAZINE HCL 25 MG PO TABS: 25.0000 mg | ORAL_TABLET | Freq: Four times a day (QID) | ORAL | Status: DC | PRN

## 2015-01-02 MED ORDER — KETOROLAC TROMETHAMINE 60 MG/2ML IM SOLN
60.0000 mg | Freq: Once | INTRAMUSCULAR | Status: DC
  Filled 2015-01-02: qty 2

## 2015-01-02 NOTE — MAU Note (Signed)
Pt reports she got pregnant with IUD inplace. It has been removed. Was here last week and hormone level was 101. Then on Monday in MD office it was 66. Having increased vag bleeding and back and abd pain for several days. Taking percocet without much relif. Told she could possible have an ectopic pregnancy but they are unsure at this time.

## 2015-01-02 NOTE — Discharge Instructions (Signed)
Miscarriage  A miscarriage is the sudden loss of an unborn baby (fetus) before the 20th week of pregnancy. Most miscarriages happen in the first 3 months of pregnancy. Sometimes, it happens before a woman even knows she is pregnant. A miscarriage is also called a "spontaneous miscarriage" or "early pregnancy loss." Having a miscarriage can be an emotional experience. Talk with your caregiver about any questions you may have about miscarrying, the grieving process, and your future pregnancy plans.  CAUSES    Problems with the fetal chromosomes that make it impossible for the baby to develop normally. Problems with the baby's genes or chromosomes are most often the result of errors that occur, by chance, as the embryo divides and grows. The problems are not inherited from the parents.   Infection of the cervix or uterus.    Hormone problems.    Problems with the cervix, such as having an incompetent cervix. This is when the tissue in the cervix is not strong enough to hold the pregnancy.    Problems with the uterus, such as an abnormally shaped uterus, uterine fibroids, or congenital abnormalities.    Certain medical conditions.    Smoking, drinking alcohol, or taking illegal drugs.    Trauma.   Often, the cause of a miscarriage is unknown.   SYMPTOMS    Vaginal bleeding or spotting, with or without cramps or pain.   Pain or cramping in the abdomen or lower back.   Passing fluid, tissue, or blood clots from the vagina.  DIAGNOSIS   Your caregiver will perform a physical exam. You may also have an ultrasound to confirm the miscarriage. Blood or urine tests may also be ordered.  TREATMENT    Sometimes, treatment is not necessary if you naturally pass all the fetal tissue that was in the uterus. If some of the fetus or placenta remains in the body (incomplete miscarriage), tissue left behind may become infected and must be removed. Usually, a dilation and curettage (D and C) procedure is performed.  During a D and C procedure, the cervix is widened (dilated) and any remaining fetal or placental tissue is gently removed from the uterus.   Antibiotic medicines are prescribed if there is an infection. Other medicines may be given to reduce the size of the uterus (contract) if there is a lot of bleeding.   If you have Rh negative blood and your baby was Rh positive, you will need a Rh immunoglobulin shot. This shot will protect any future baby from having Rh blood problems in future pregnancies.  HOME CARE INSTRUCTIONS    Your caregiver may order bed rest or may allow you to continue light activity. Resume activity as directed by your caregiver.   Have someone help with home and family responsibilities during this time.    Keep track of the number of sanitary pads you use each day and how soaked (saturated) they are. Write down this information.    Do not use tampons. Do not douche or have sexual intercourse until approved by your caregiver.    Only take over-the-counter or prescription medicines for pain or discomfort as directed by your caregiver.    Do not take aspirin. Aspirin can cause bleeding.    Keep all follow-up appointments with your caregiver.    If you or your partner have problems with grieving, talk to your caregiver or seek counseling to help cope with the pregnancy loss. Allow enough time to grieve before trying to get pregnant again.     SEEK IMMEDIATE MEDICAL CARE IF:    You have severe cramps or pain in your back or abdomen.   You have a fever.   You pass large blood clots (walnut-sized or larger) ortissue from your vagina. Save any tissue for your caregiver to inspect.    Your bleeding increases.    You have a thick, bad-smelling vaginal discharge.   You become lightheaded, weak, or you faint.    You have chills.   MAKE SURE YOU:   Understand these instructions.   Will watch your condition.   Will get help right away if you are not doing well or get worse.     This  information is not intended to replace advice given to you by your health care provider. Make sure you discuss any questions you have with your health care provider.     Document Released: 09/06/2000 Document Revised: 07/08/2012 Document Reviewed: 05/02/2011  Elsevier Interactive Patient Education 2016 [employer]
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
## 2015-01-03 LAB — ABO/RH: ABO/RH(D): O POS

## 2015-01-11 ENCOUNTER — Ambulatory Visit (INDEPENDENT_AMBULATORY_CARE_PROVIDER_SITE_OTHER): Admitting: Internal Medicine

## 2015-01-11 VITALS — BP 120/92 | HR 100 | Temp 98.0°F | Resp 20 | Ht 64.0 in | Wt 142.0 lb

## 2015-01-11 DIAGNOSIS — F329 Major depressive disorder, single episode, unspecified: Secondary | ICD-10-CM

## 2015-01-11 DIAGNOSIS — F32A Depression, unspecified: Secondary | ICD-10-CM

## 2015-01-11 MED ORDER — ALPRAZOLAM 1 MG PO TABS: ORAL_TABLET | ORAL | Status: DC

## 2015-01-11 MED ORDER — ESCITALOPRAM OXALATE 20 MG PO TABS: 30.0000 mg | ORAL_TABLET | Freq: Every day | ORAL | Status: DC
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
## 2015-01-13 ENCOUNTER — Emergency Department (HOSPITAL_COMMUNITY)
Admission: EM | Admit: 2015-01-13 | Discharge: 2015-01-14 | Disposition: A | Discharge status: Home / Self Care | Attending: Emergency Medicine | Admitting: Emergency Medicine

## 2015-01-13 ENCOUNTER — Encounter (HOSPITAL_COMMUNITY): Payer: Self-pay | Admitting: *Deleted

## 2015-01-13 DIAGNOSIS — F410 Panic disorder [episodic paroxysmal anxiety] without agoraphobia: Secondary | ICD-10-CM | POA: Insufficient documentation

## 2015-01-13 DIAGNOSIS — R5383 Other fatigue: Secondary | ICD-10-CM | POA: Insufficient documentation

## 2015-01-13 DIAGNOSIS — Z87828 Personal history of other (healed) physical injury and trauma: Secondary | ICD-10-CM | POA: Insufficient documentation

## 2015-01-13 DIAGNOSIS — F419 Anxiety disorder, unspecified: Secondary | ICD-10-CM | POA: Insufficient documentation

## 2015-01-13 DIAGNOSIS — Z79899 Other long term (current) drug therapy: Secondary | ICD-10-CM | POA: Insufficient documentation

## 2015-01-13 DIAGNOSIS — Z8669 Personal history of other diseases of the nervous system and sense organs: Secondary | ICD-10-CM | POA: Insufficient documentation

## 2015-01-13 DIAGNOSIS — F319 Bipolar disorder, unspecified: Secondary | ICD-10-CM | POA: Insufficient documentation

## 2015-01-13 DIAGNOSIS — R55 Syncope and collapse: Secondary | ICD-10-CM | POA: Insufficient documentation

## 2015-01-13 DIAGNOSIS — T401X1A Poisoning by heroin, accidental (unintentional), initial encounter: Secondary | ICD-10-CM | POA: Insufficient documentation

## 2015-01-13 MED ORDER — SODIUM CHLORIDE 0.9 % IV BOLUS (SEPSIS)
1000.0000 mL | Freq: Once | INTRAVENOUS | Status: AC
Start: 2015-01-13 — End: 2015-01-14
  Administered 2015-01-13: 1000 mL via INTRAVENOUS

## 2015-01-13 MED ORDER — ONDANSETRON HCL 4 MG/2ML IJ SOLN
4.0000 mg | Freq: Once | INTRAMUSCULAR | Status: AC
  Administered 2015-01-13: 4 mg via INTRAVENOUS
  Filled 2015-01-13: qty 2

## 2015-01-13 MED ORDER — KETOROLAC TROMETHAMINE 30 MG/ML IJ SOLN
30.0000 mg | Freq: Once | INTRAMUSCULAR | Status: AC
  Administered 2015-01-13: 30 mg via INTRAVENOUS
  Filled 2015-01-13: qty 1

## 2015-01-13 MED ORDER — SODIUM CHLORIDE 0.9 % IV BOLUS (SEPSIS)
1000.0000 mL | Freq: Once | INTRAVENOUS | Status: AC
Start: 2015-01-13 — End: 2015-01-13
  Administered 2015-01-13: 1000 mL via INTRAVENOUS

## 2015-01-13 MED ORDER — NALOXONE HCL 0.4 MG/ML IJ SOLN
0.4000 mg | Freq: Once | INTRAMUSCULAR | Status: AC
  Administered 2015-01-13: 0.4 mg via INTRAVENOUS
  Filled 2015-01-13: qty 1

## 2015-01-13 NOTE — ED Notes (Signed)
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 Pt denies needing any counseling or resources for drug addiction as she is currently in a program. Pt denies SI - per pt's father, pt her for evaluation s/p unresponsiveness.
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
## 2015-01-13 NOTE — ED Notes (Signed)
PT O2 stats keeping dropping, PT very sleepy. Placed PT on 2litres via [place]

## 2015-01-14 ENCOUNTER — Telehealth: Payer: Self-pay

## 2015-01-14 MED ORDER — ACETAMINOPHEN 325 MG PO TABS
650.0000 mg | ORAL_TABLET | Freq: Once | ORAL | Status: AC
  Administered 2015-01-14: 650 mg via ORAL
  Filled 2015-01-14: qty 2

## 2015-01-14 NOTE — Telephone Encounter (Signed)
[facility].  She overdosed on Herion last night went to ER [facility]   [phone]

## 2015-01-14 NOTE — Telephone Encounter (Signed)
I dont know if you can Rx this.

## 2015-01-14 NOTE — Discharge Instructions (Signed)
Drug Overdose °Drug overdose happens when you take too much of a drug. An overdose can occur with illegal drugs, prescription drugs, or over-the-counter (OTC) drugs. °The effects of drug overdose can be mild, dangerous, or even deadly. °CAUSES °Drug overdose may be caused by: °· Taking too much of a drug on purpose. °· Taking too much of a drug by accident. °· An error made by a health care provider who prescribes a drug. °· An error made by a pharmacist who fills the prescription order. °Drugs that commonly cause overdose include: °· Mental health drugs. °· Pain medicines. °· Illegal drugs. °· OTC cough and cold medicines. °· Heart medicines. °· Seizure medicines. °RISK FACTORS °Drug overdose is more likely in: °· Children. They may be attracted to colorful pills. Because of children's small size, even a small amount of a drug can be dangerous. °· Elderly people. They may be taking many different drugs. Elderly people may have difficulty reading labels or remembering when they last took their medicine. °The risk of drug overdose is also higher for someone who: °· Takes illegal drugs. °· Takes a drug and drinks alcohol. °· Has a mental health condition. °SYMPTOMS °Signs and symptoms of drug overdose depend on the drug and the amount that was taken. Common danger signs include: °· Behavior changes. °· Sleepiness. °· Slowed breathing. °· Nausea and vomiting. °· Seizures. °· Changes in eye pupil size (very large or very small). °If there are signs of very low blood pressure from a drug overdose (shock), emergency treatment is required. These signs include: °· Cold and clammy skin. °· Pale skin. °· Blue lips. °· Very slow breathing. °· Extreme sleepiness. °· Loss of consciousness. °DIAGNOSIS °Drug overdose may be diagnosed based on your symptoms. It is important that you tell your health care provider: °· All of the drugs that you have taken. °· When you took the drugs. °· Whether you were drinking alcohol. °Your health  care provider will do a physical exam. This exam may include: °· Checking and monitoring your heart rate and rhythm, your temperature, and your blood pressure (vital signs). °· Checking your breathing and oxygen level. °You may also have tests, including:  °· Urine tests to check for drugs in your system. °· Blood tests to check for: °¨ Drugs in your system. °¨ Signs of an imbalance of your blood minerals (electrolytes). °¨ Liver damage. °¨ Kidney damage. °TREATMENT °Supporting your vital signs and your breathing is the first step in treating a drug overdose. Treatment may also include: °· Receiving fluids and electrolytes through an IV tube. °· Having a breathing tube (endotracheal tube) inserted in your airway to help you breathe. °· Having a tube passed through your nose and into your stomach (nasogastric tube) to wash out your stomach. °· Medicines. You may get medicines to: °¨ Make you vomit. °¨ Absorb any medicine that is left in your digestive system (activated charcoal). °¨ Block or reverse the effect of the drug that caused the overdose. °· Having your blood filtered through an artificial kidney machine (hemodialysis). You may need this if your overdose is severe or if you have kidney failure. °· Having ongoing counseling and mental health support if you intentionally overdosed or used an illegal drug. °HOME CARE INSTRUCTIONS °· Take medicines only as directed by your health care provider. Always ask your health care provider to discuss the possible side effects of any new drug that you start taking. °· Keep a list of all of the drugs   that you take, including over-the-counter medicines. Bring this list with you to all of your medical visits. °· Read the drug inserts that come with your medicines. °· Do not use illegal drugs. °· Do not drink alcohol when taking drugs. °· Store all medicines in safety containers that are out of the reach of children. °· Keep the phone number of your local poison control  center near your phone or on your cell phone. °· Get help if you are struggling with alcohol or drug use. °· Get help if you are struggling with depression or another mental health problem. °· Keep all follow-up visits as directed by your health care provider. This is important. °SEEK MEDICAL CARE IF: °· Your symptoms return. °· You develop any new signs or symptoms when you are taking medicines. °SEEK IMMEDIATE MEDICAL CARE IF: °· You think that you or someone else may have taken too much of a drug. The hotline of the National Poison Control Center is (800) 222-1222. °· You or someone else is having symptoms of a drug overdose. °· You have serious thoughts about hurting yourself or others. °· You have chest pain. °· You have difficulty breathing. °· You have a loss of consciousness. °Drug overdose is an emergency. Do not wait to see if the symptoms will go away. Get medical help right away. Call your local emergency services (911 in the [place] Do not drive yourself to the hospital. °  °This information is not intended to replace advice given to you by your health care provider. Make sure you discuss any questions you have with your health care provider. °  °Document Released: 07/28/2014 Document Reviewed: 07/28/2014 °Elsevier Interactive Patient Education ©2016 [employer] ° °

## 2015-01-15 MED ORDER — NALOXONE HCL 0.4 MG/0.4ML IJ SOAJ: INTRAMUSCULAR | Status: DC

## 2015-01-15 NOTE — Telephone Encounter (Signed)
Left message for pt to call back  °

## 2015-01-15 NOTE — Telephone Encounter (Signed)
Meds ordered this encounter  Medications  . Naloxone HCl 0.4 MG/0.4ML SOAJ    Sig: 1 dose into the anterolateral thigh at time of overdose    Dispense:  1 Package    Refill:  1  sent to pharmacy She will also need to go to [facility] to consider further treatment with suboxone Please give Mom number to make appointment

## 2015-01-15 NOTE — Telephone Encounter (Signed)
Patient's mother, Joyce GrossKay is retuning missed phone call in regards to getting narcan for the patient. Mother is not on the release but states that she's been communicating with Dr. Merla Richesoolittle about the patients problem and she is requesting that we call her since patient has been overdosing on drugs.   Kay's phone: [phone]

## 2015-01-15 NOTE — Telephone Encounter (Signed)
Mom notified. Gave message from Dr. Merla Richesoolittle.

## 2015-01-25 ENCOUNTER — Ambulatory Visit (INDEPENDENT_AMBULATORY_CARE_PROVIDER_SITE_OTHER): Admitting: Internal Medicine

## 2015-01-25 VITALS — BP 122/72 | HR 100 | Temp 98.3°F | Resp 17 | Ht 66.0 in | Wt 139.0 lb

## 2015-01-25 DIAGNOSIS — G4700 Insomnia, unspecified: Secondary | ICD-10-CM

## 2015-01-25 DIAGNOSIS — F32A Depression, unspecified: Secondary | ICD-10-CM

## 2015-01-25 DIAGNOSIS — F1110 Opioid abuse, uncomplicated: Secondary | ICD-10-CM | POA: Diagnosis not present

## 2015-01-25 DIAGNOSIS — F329 Major depressive disorder, single episode, unspecified: Secondary | ICD-10-CM | POA: Diagnosis not present

## 2015-01-25 DIAGNOSIS — F411 Generalized anxiety disorder: Secondary | ICD-10-CM | POA: Diagnosis not present

## 2015-01-25 DIAGNOSIS — M545 Low back pain, unspecified: Secondary | ICD-10-CM

## 2015-01-25 DIAGNOSIS — F1190 Opioid use, unspecified, uncomplicated: Secondary | ICD-10-CM

## 2015-01-25 MED ORDER — ESCITALOPRAM OXALATE 20 MG PO TABS: 40.0000 mg | ORAL_TABLET | Freq: Every day | ORAL | Status: DC

## 2015-01-25 MED ORDER — CYCLOBENZAPRINE HCL 10 MG PO TABS: 10.0000 mg | ORAL_TABLET | Freq: Three times a day (TID) | ORAL | Status: DC | PRN

## 2015-01-25 MED ORDER — CLONAZEPAM 2 MG PO TABS: 2.0000 mg | ORAL_TABLET | Freq: Three times a day (TID) | ORAL | Status: DC | PRN

## 2015-01-25 MED ORDER — MELOXICAM 15 MG PO TABS: 15.0000 mg | ORAL_TABLET | Freq: Every day | ORAL | Status: DC

## 2015-01-25 NOTE — Patient Instructions (Signed)
  [facility]  [address] [place], [place] [address] - View Map  Phone: [phone]  Web: [email or web address]

## 2015-01-25 NOTE — Progress Notes (Signed)
Subjective:  This chart was scribed for Allison Sia, MD by Allison Richard, ED Scribe. This patient was seen in room 5 and the patient's care was started at 11:59 AM.   Patient ID: Allison Richard, female    DOB: [birth year], 29 y.o.   MRN: [id number]  HPI   Chief Complaint  Patient presents with  . Medication Refill    xanax, seroquel    HPI Comments: Allison Richard is a 29 y.o. female who presents to the [facility] for medication refill. Recently seen in the ED for heroin over dose, had been 66 days clean. States she was having difficult controlling her anxiety and turned to heroin when anxiety became too much. She admits that once the thought of heroin comes into her head, she has difficulty controlling her impulse decision to use. Her mother wants to get narcan for patient,a prescription was sent in but pt is unsure if the prescription was picked up.   Pt goes to NA meetings. She has looked into the [facility], but has been too busy with work. Does not want methadone or suboxane.   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   She would like to an increase in lexapro and klonopin. She has no effort or motivation to get up clean, cook, or go to work.  Anxiety is pervasive. Psychological antecedents outlined in chart.  She also has back pain that has returned. Her job requires to be on her feet for long periods of time. She has been taking 1 and a half tablet of flexeril at night. No rad sxt.   Patient Active Problem List   Diagnosis Date Noted  . 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 Atypical chest pain 11/04/2014  . Seizures (HCC) 12/22/2013  . Anxiety state 12/22/2013  . Depression 12/22/2013  . Depression with anxiety 12/08/2011  . Acne 12/08/2011  . Insomnia 12/08/2011  . Lip laceration 11/07/2011  . Open wound of left upper arm 11/07/2011    Prior to Admission medications   Medication Sig Start Date End Date Taking? Authorizing Provider    acyclovir (ZOVIRAX) 200 MG capsule Take two tablets three times a day for 5 days for each outbreak. 12/28/14  Yes Marlis Edelson, CNM  albuterol (PROVENTIL HFA;VENTOLIN HFA) 108 (90 BASE) MCG/ACT inhaler Inhale 2 puffs into the lungs every 6 (six) hours as needed for wheezing or shortness of breath. 12/23/14  Yes Tonye Pearson, MD  ALPRAZolam Prudy Feeler) 1 MG tablet 1 am, 1 midday, 2 hs 01/11/15  Yes Tonye Pearson, MD  escitalopram (LEXAPRO) 20 MG tablet Take 1.5 tablets (30 mg total) by mouth daily. 01/11/15  Yes Tonye Pearson, MD  fluticasone (FLONASE) 50 MCG/ACT nasal spray 2 sprays each nostril once a day Patient taking differently: Place 2 sprays into both nostrils daily as needed for rhinitis.  12/07/14  Yes Tonye Pearson, MD  Naloxone HCl 0.4 MG/0.4ML SOAJ 1 dose into the anterolateral thigh at time of overdose 01/15/15  Yes Tonye Pearson, MD  promethazine (PHENERGAN) 25 MG tablet Take 1 tablet (25 mg total) by mouth every 6 (six) hours as needed for nausea or vomiting. 01/02/15  Yes Judeth Horn, NP  QUEtiapine (SEROQUEL) 300 MG tablet Take 1 tablet (300 mg total) by mouth at bedtime. 12/07/14  Yes Tonye Pearson, MD   Review of Systems  Constitutional: Negative for fever, chills, diaphoresis and activity change.  Respiratory: Negative for shortness of breath.  Cardiovascular: Negative for chest pain, palpitations and leg swelling.  Genitourinary: Negative for flank pain and difficulty urinating.       See recent spontan Ab  Musculoskeletal: Positive for myalgias and back pain.  Psychiatric/Behavioral: Positive for sleep disturbance and dysphoric mood. The patient is nervous/anxious.     Objective:   Physical Exam  Constitutional: She is oriented to person, place, and time. She appears well-developed and well-nourished. No distress.  HENT:  Head: Normocephalic and atraumatic.  Eyes: Conjunctivae and EOM are normal.  Neck: Neck supple.  Cardiovascular:  Normal rate.   Pulmonary/Chest: Effort normal.  Musculoskeletal: Normal range of motion.  Mildly tender over the lumbar area but negative straight leg raise to 90 bilaterally and preserved reflexes.  Neurological: She is alert and oriented to person, place, and time. No cranial nerve deficit.  Skin: Skin is warm and dry.  Psychiatric: She has a normal mood and affect. Her behavior is normal. Thought content normal.  Nursing note and vitals reviewed.   Filed Vitals:   01/25/15 1118  BP: 122/72  Pulse: 100  Temp: 98.3 F (36.8 C)  TempSrc: Oral  Resp: 17  Height: 5\' 6"  (1.676 m)  Weight: 139 lb (63.05 kg)  SpO2: 98%   Assessment & Plan:   1. GAD (generalized anxiety disorder)   2. Depression   3. Bilateral low back pain without sciatica   4. Insomnia   5. Heroin use    Patient Instructions   [facility]  [address] [place], [place] [address] - View Map  Phone: [phone]  Web: [email or web address]   lex incr to 40 xan chg to klon at sl incr Ref to couns Flex/stretch/melox for back Fu 29mo  Meds ordered this encounter  Medications  . escitalopram (LEXAPRO) 20 MG tablet    Sig: Take 2 tablets (40 mg total) by mouth daily.    Dispense:  60 tablet    Refill:  2  . clonazePAM (KLONOPIN) 2 MG tablet    Sig: Take 1 tablet (2 mg total) by mouth 3 (three) times daily as needed for anxiety.    Dispense:  90 tablet    Refill:  0  . cyclobenzaprine (FLEXERIL) 10 MG tablet    Sig: Take 1 tablet (10 mg total) by mouth 3 (three) times daily as needed for muscle spasms.    Dispense:  60 tablet    Refill:  1  . meloxicam (MOBIC) 15 MG tablet    Sig: Take 1 tablet (15 mg total) by mouth daily. As needed for back pain    Dispense:  30 tablet    Refill:  2   By signing my name below, I, Allison Richard, attest that this documentation has been prepared under the direction and in the presence of Allison Siaobert Allison Pol, MD.  Electronically Signed:  Andrew Auaven Richard, ED Scribe. 01/25/2015. 12:30 PM.  I have completed the patient encounter in its entirety as documented by the scribe, with editing by me where necessary. Allison Richard P. Merla Richesoolittle, M.D.

## 2015-01-27 ENCOUNTER — Ambulatory Visit: Admitting: Internal Medicine

## 2015-02-07 ENCOUNTER — Emergency Department (HOSPITAL_COMMUNITY)
Admission: EM | Admit: 2015-02-07 | Discharge: 2015-02-07 | Disposition: A | Discharge status: Home / Self Care | Attending: Emergency Medicine | Admitting: Emergency Medicine

## 2015-02-07 ENCOUNTER — Encounter (HOSPITAL_COMMUNITY): Payer: Self-pay | Admitting: Emergency Medicine

## 2015-02-07 DIAGNOSIS — F1310 Sedative, hypnotic or anxiolytic abuse, uncomplicated: Secondary | ICD-10-CM | POA: Diagnosis not present

## 2015-02-07 DIAGNOSIS — Z8669 Personal history of other diseases of the nervous system and sense organs: Secondary | ICD-10-CM | POA: Diagnosis not present

## 2015-02-07 DIAGNOSIS — R000 Tachycardia, unspecified: Secondary | ICD-10-CM | POA: Diagnosis not present

## 2015-02-07 DIAGNOSIS — F319 Bipolar disorder, unspecified: Secondary | ICD-10-CM | POA: Insufficient documentation

## 2015-02-07 DIAGNOSIS — F1410 Cocaine abuse, uncomplicated: Secondary | ICD-10-CM | POA: Insufficient documentation

## 2015-02-07 DIAGNOSIS — Z3202 Encounter for pregnancy test, result negative: Secondary | ICD-10-CM | POA: Diagnosis not present

## 2015-02-07 DIAGNOSIS — F1210 Cannabis abuse, uncomplicated: Secondary | ICD-10-CM | POA: Diagnosis not present

## 2015-02-07 DIAGNOSIS — F1110 Opioid abuse, uncomplicated: Secondary | ICD-10-CM | POA: Diagnosis present

## 2015-02-07 DIAGNOSIS — F1910 Other psychoactive substance abuse, uncomplicated: Secondary | ICD-10-CM

## 2015-02-07 DIAGNOSIS — F410 Panic disorder [episodic paroxysmal anxiety] without agoraphobia: Secondary | ICD-10-CM | POA: Insufficient documentation

## 2015-02-07 DIAGNOSIS — Z79899 Other long term (current) drug therapy: Secondary | ICD-10-CM | POA: Diagnosis not present

## 2015-02-07 LAB — RAPID URINE DRUG SCREEN, HOSP PERFORMED
Amphetamines: NOT DETECTED
BENZODIAZEPINES: POSITIVE — AB
Barbiturates: NOT DETECTED
COCAINE: POSITIVE — AB
OPIATES: POSITIVE — AB
Tetrahydrocannabinol: POSITIVE — AB

## 2015-02-07 LAB — BASIC METABOLIC PANEL
Anion gap: 8 (ref 5–15)
BUN: 11 mg/dL (ref 6–20)
CALCIUM: 8.8 mg/dL — AB (ref 8.9–10.3)
CO2: 29 mmol/L (ref 22–32)
Chloride: 102 mmol/L (ref 101–111)
Creatinine, Ser: 0.9 mg/dL (ref 0.44–1.00)
Glucose, Bld: 80 mg/dL (ref 65–99)
Potassium: 3.2 mmol/L — ABNORMAL LOW (ref 3.5–5.1)
SODIUM: 139 mmol/L (ref 135–145)

## 2015-02-07 LAB — CBC WITH DIFFERENTIAL/PLATELET
BASOS ABS: 0 10*3/uL (ref 0.0–0.1)
BASOS PCT: 0 %
EOS ABS: 0.1 10*3/uL (ref 0.0–0.7)
EOS PCT: 1 %
HEMATOCRIT: 37.6 % (ref 36.0–46.0)
Hemoglobin: 12.6 g/dL (ref 12.0–15.0)
Lymphocytes Relative: 14 %
Lymphs Abs: 1.4 10*3/uL (ref 0.7–4.0)
MCH: 29.6 pg (ref 26.0–34.0)
MCHC: 33.5 g/dL (ref 30.0–36.0)
MCV: 88.3 fL (ref 78.0–100.0)
Monocytes Absolute: 0.8 10*3/uL (ref 0.1–1.0)
Monocytes Relative: 9 %
NEUTROS ABS: 7.1 10*3/uL (ref 1.7–7.7)
Neutrophils Relative %: 76 %
PLATELETS: 137 10*3/uL — AB (ref 150–400)
RBC: 4.26 MIL/uL (ref 3.87–5.11)
RDW: 12.4 % (ref 11.5–15.5)
WBC: 9.4 10*3/uL (ref 4.0–10.5)

## 2015-02-07 LAB — POC URINE PREG, ED: Preg Test, Ur: NEGATIVE

## 2015-02-07 MED ORDER — POTASSIUM CHLORIDE CRYS ER 20 MEQ PO TBCR
20.0000 meq | EXTENDED_RELEASE_TABLET | Freq: Once | ORAL | Status: AC
  Administered 2015-02-07: 20 meq via ORAL
  Filled 2015-02-07: qty 1

## 2015-02-07 MED ORDER — SODIUM CHLORIDE 0.9 % IV BOLUS (SEPSIS)
1000.0000 mL | Freq: Once | INTRAVENOUS | Status: AC
  Administered 2015-02-07: 1000 mL via INTRAVENOUS

## 2015-02-07 MED ORDER — HYDROCODONE-ACETAMINOPHEN 5-325 MG PO TABS: 1.0000 | ORAL_TABLET | Freq: Once | ORAL | Status: DC

## 2015-02-07 NOTE — ED Notes (Signed)
Pt ambulated to restroom with steady gait.

## 2015-02-07 NOTE — ED Notes (Signed)
As I was getting up to leave the room, pt stated that she felt like she was going to have a seizure.  Pt then started gently shaking her hands and bouncing her legs.  Pt let this writer know that she was currently having a seizure.  Pt was put into the bed.  When pt was being put in the bed, she asked with her eyes closed,  "how many seizures have I had so far?".  This Clinical research associatewriter responded that she had none.

## 2015-02-07 NOTE — ED Notes (Signed)
PA at bedside.

## 2015-02-07 NOTE — ED Notes (Signed)
Per EMS: Pt from work at [employer].  Laying on bench seat.  She had her coworkers call ems because she "felt like she was going to have a seizure.".  Pt stated that she is having an out of body experience and that's how she felt last year when she had a seizure from taking tramadol.  This is the one seizure she has had in her life.  Pt appears to be very groggy.  Pt talking with her eyes closed.  Hx of narcotic use.  EMS told pt that she was not presenting as someone who was having a seizure because she is not shaking.  Pt then started shaking her hands gently and then opened her eyes saying, "what happened?".
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
## 2015-02-07 NOTE — ED Notes (Signed)
Upon return to room from restroom patient speaking with father and asking "how did I get here, what happened". Patient has not had any LOC while here.  Pt swearing to father that she did not use any drugs today. Pt requesting that I restart her IV that was placed by EMS because it is painful. This RN places new IV. This RN also notices fresh track marks to hands and forearm. New IV in place and infusing without difficulty.

## 2015-02-07 NOTE — Discharge Instructions (Signed)
Polysubstance Abuse °When people abuse more than one drug or type of drug it is called polysubstance or polydrug abuse. For example, many smokers also drink alcohol. This is one form of polydrug abuse. Polydrug abuse also refers to the use of a drug to counteract an unpleasant effect produced by another drug. It may also be used to help with withdrawal from another drug. People who take stimulants may become agitated. Sometimes this agitation is countered with a tranquilizer. This helps protect against the unpleasant side effects. Polydrug abuse also refers to the use of different drugs at the same time.  °Anytime drug use is interfering with normal living activities, it has become abuse. This includes problems with family and friends. Psychological dependence has developed when your mind tells you that the drug is needed. This is usually followed by physical dependence which has developed when continuing increases of drug are required to get the same feeling or "high". This is known as addiction or chemical dependency. A person's risk is much higher if there is a history of chemical dependency in the family. °SIGNS OF CHEMICAL DEPENDENCY °· You have been told by friends or family that drugs have become a problem. °· You fight when using drugs. °· You are having blackouts (not remembering what you do while using). °· You feel sick from using drugs but continue using. °· You lie about use or amounts of drugs (chemicals) used. °· You need chemicals to get you going. °· You are suffering in work performance or in school because of drug use. °· You get sick from use of drugs but continue to use anyway. °· You need drugs to relate to people or feel comfortable in social situations. °· You use drugs to forget problems. °"Yes" answered to any of the above signs of chemical dependency indicates there are problems. The longer the use of drugs continues, the greater the problems will become. °If there is a family history of  drug or alcohol use, it is best not to experiment with these drugs. Continual use leads to tolerance. After tolerance develops more of the drug is needed to get the same feeling. This is followed by addiction. With addiction, drugs become the most important part of life. It becomes more important to take drugs than participate in the other usual activities of life. This includes relating to friends and family. Addiction is followed by dependency. Dependency is a condition where drugs are now needed not just to get high, but to feel normal. °Addiction cannot be cured but it can be stopped. This often requires outside help and the care of professionals. Treatment centers are listed in the yellow pages under: Cocaine, Narcotics, and Alcoholics Anonymous. Most hospitals and clinics can refer you to a specialized care center. Talk to your caregiver if you need help. °  °This information is not intended to replace advice given to you by your health care provider. Make sure you discuss any questions you have with your health care provider. °  °Document Released: 11/02/2004 Document Revised: 06/05/2011 Document Reviewed: 03/18/2014 °Elsevier Interactive Patient Education ©2016 Elsevier Inc. ° ° ° °Emergency Department Resource Guide °1) Find a Doctor and Pay Out of Pocket °Although you won't have to find out who is covered by your insurance plan, it is a good idea to ask around and get recommendations. You will then need to call the office and see if the doctor you have chosen will accept you as a new patient and what types of options   they offer for patients who are self-pay. Some doctors offer discounts or will set up payment plans for their patients who do not have insurance, but you will need to ask so you aren't surprised when you get to your appointment. ° °2) Contact Your Local Health Department °Not all health departments have doctors that can see patients for sick visits, but many do, so it is worth a call to see if  yours does. If you don't know where your local health department is, you can check in your phone book. The CDC also has a tool to help you locate your state's health department, and many state websites also have listings of all of their local health departments. ° °3) Find a Walk-in Clinic °If your illness is not likely to be very severe or complicated, you may want to try a walk in clinic. These are popping up all over the country in pharmacies, drugstores, and shopping centers. They're usually staffed by nurse practitioners or physician assistants that have been trained to treat common illnesses and complaints. They're usually fairly quick and inexpensive. However, if you have serious medical issues or chronic medical problems, these are probably not your best option. ° °No Primary Care Doctor: °- Call [facility] at  832-8000 - they can help you locate a primary care doctor that  accepts your insurance, provides certain services, etc. °- Physician Referral Service- 1-800-533-3463 ° °Chronic Pain Problems: °Organization         Address  Phone   Notes  °[facility]  (336) 297-2271 Patients need to be referred by their primary care doctor.  ° °Medication Assistance: °Organization         Address  Phone   Notes  °[place] [facility] [address] °[place], [place] [address] (336) 641-8030 --Must be a resident of [place] °-- Must have NO insurance coverage whatsoever (no Medicaid/ Medicare, etc.) °-- The pt. MUST have a primary care doctor that directs their care regularly and follows them in the community °  °[facility]  (866) 331-1348   °[employer]  (888) 892-1162   ° °Agencies that provide inexpensive medical care: °Organization         Address  Phone   Notes  °[facility]  (336) 832-8035   °[facility]    (336) 832-7272   °[facility] [address] °[place], [place] [address] (336) 832-4777    °[facility] [address], °[place] (336) 271-4999   °[facility]    (336) 373-0678   °[facility]    (336) 272-1050   °[facility] ° [address], [place] Phone:  (336) 832-4444, Fax:  (336) 832-4440 Hours of Operation:  9 am - 6 pm, M-F.  Also accepts Medicaid/Medicare and self-pay.  °[facility] ° [address],  Phone: (336) 832-3150, Fax: (336) 832-3151. Hours of Operation:  8:30 am - 5:30 pm, M-F.  Also accepts Medicaid and self-pay.  °[facility] [place] [address], [place] Phone: (336) 878-6027   °[facility] [address], [place], [place] (336)723-1848, Ext. 123 Mondays & Thursdays: 7-9 AM.  First 15 patients are seen on a first come, first serve basis. °  ° °Medicaid-accepting [place] Providers: ° °Organization         Address  Phone   Notes  °[facility] [address], [place] (336) 641-2100 Also accepts self-pay patients.  °[facility] [address], [place] ° (336) 856-9996   °[facility] [address], [place] (336) 288-8857   °[facility] [address] [place] [address], [place] (336) 299-7000   °Veita Bland [address], [place]  ° (336) 373-1557 Only accepts [place] Access Medicaid patients after they have their name applied to their card.  ° °Self-Pay (no insurance) in [place]: ° °Organization         Address  Phone   Notes  °Sickle Cell Patients, [facility] [address], [place] (336) 832-1970   °[facility] [address], [place] (336) 832-4400   °[facility] ° [address], [place] (336) 992-4800   °[facility]/Dr. Osei-Bonsu ° [address] [place] [address], [place] or [address], [place] (336)  841-8500 Phone number for both [place] and [place] locations is the same.  °[facility] [address], [place] (336) 299-0000   °[facility] [address] [place] [address], [place] or [address] (336) 852-7530 °(336) 878-2260   °[facility] [address], [place] (336) 350-1642, phone; (336) 294-5005, fax Sees patients 1st and 3rd Saturday of every month.  Must not qualify for public or private insurance (i.e. Medicaid, Medicare, [place] Health Choice, Veterans' Benefits) • Household income should be no more than 200% of the poverty level •The clinic cannot treat you if you are pregnant or think you are pregnant • Sexually transmitted diseases are not treated at the clinic.  ° ° °Dental Care: °Organization         Address  Phone  Notes  °[facility] [address], [place] (336) 641-6152 Accepts children up to age 21 who are enrolled in Medicaid or [place] Health Choice; pregnant women with a Medicaid card; and children who have applied for Medicaid or [place] Health Choice, but were declined, whose parents can pay a reduced fee at time of service.  °[facility] [place]  [address], [place] (336) 641-7733 Accepts children up to age 21 who are enrolled in Medicaid or [place] Health Choice; pregnant women with a Medicaid card; and children who have applied for Medicaid or [place] Health Choice, but were declined, whose parents can pay a reduced fee at time of service.  °[facility] ° [address], [place] (336) 641-4533 Patients are seen by appointment only. Walk-ins are not accepted. [facility] will see patients 18 years of age and older. °Monday - Tuesday (8am-5pm) °Most Wednesdays (8:30-5pm) °$30 per visit, cash only  °[facility] ° [address], [place] (336) 641-4533 Patients are seen by  appointment only. Walk-ins are not accepted. [facility] will see patients 18 years of age and older. °One Wednesday Evening (Monthly: Volunteer Based).  $30 per visit, cash only  °[facility]  (919) 537-3737 for adults; Children under age 4, call Graduate Pediatric Dentistry at (919) [phone]. Children aged 4-14, please call (919) [phone] to request a pediatric application. ° Dental services are provided in all areas of dental care including fillings, crowns and bridges, complete and partial dentures,   implants, gum treatment, root canals, and extractions. Preventive care is also provided. Treatment is provided to both adults and children. °Patients are selected via a lottery and there is often a waiting list. °  °[facility] [address], °[place] ° (336) 763-8833 [email or web address] °  °[facility] [address], [place], [place] (336)723-1848, Ext. 123 Second and Fourth Thursday of each month, opens at 6:30 AM; Clinic ends at 9 AM.  Patients are seen on a first-come first-served basis, and a limited number are seen during each clinic.  ° °[facility] ° [address], [place], [place] (336) 723-7904   Eligibility Requirements °You must have lived in [place], [place], or [place] counties for at least the last three months. °  You cannot be eligible for state or federal sponsored healthcare insurance, including Veterans Administration, Medicaid, or Medicare. °  You generally cannot be eligible for healthcare insurance through your employer.  °  How to apply: °Eligibility screenings are held every Tuesday and Wednesday afternoon from 1:00 pm until 4:00 pm. You do not need an appointment for the interview!  °[facility] [address], [place], [place] 336-631-2330   °[facility]  336-342-8273   °[facility]  336-703-3100   °[facility]  336-570-6415    ° °Behavioral Health Resources in the Community: °Intensive Outpatient Programs °Organization         Address  Phone  Notes  °[facility] [address], [place], [place] 336-878-6098   °[facility] [address], [place], [place] 336-832-9800   °[facility] [address], [place], [place] ° 336-882-2125   °[facility] [address],  °[place], [place] 1-800-853-5163 or 336-641-4981   °Substance Abuse Resources °Organization         Address  Phone  Notes  °[facility]  336-882-2125   °[facility]  336-784-9470   °[facility]  336-285-9073   °[facility]  336-845-3988   °Residential & Outpatient Substance Abuse Program  1-800-659-3381   °Psychological Services °Organization         Address  Phone  Notes  °[facility]  336- 832-9600   °[facility]  336- 378-7881   °[facility] [address], [place] 1-800-853-5163 or 336-641-4981   ° °Mobile Crisis Teams °Organization         Address  Phone  Notes  °[facility], Mobile Crisis Care Unit  1-877-626-1772   °Assertive °[facility] ° [address] [place], [place] 336-834-9664   °Sharon DeEsch [address] °[place] 336-554-5454   ° °Self-Help/Support Groups °Organization         Address  Phone             Notes  °[facility] [place] - variety of support groups  336- 373-1402 Call for more information  °Narcotics Anonymous (NA), [facility] [address], °[place]  2 meetings at this location  ° °Residential Treatment Programs °Organization         Address  Phone  Notes  °[facility] [address],    °[place]  1-866-801-8205   °[facility] ° [address], [place], [place] 704-293-8524   °[facility] [address], [place] 336-845-3988 Admissions: 8am-3pm M-F  °In[facility]    [facility] [address]    °[place], [place] 336-841-1104   °[facility] [address], [place], [place] 336-379-7146   °[facility] [address]  °[place], [place] 336-285-9073   °[facility] Intensive Outpatient [address], [place], [place] 336-852-3033   °[facility] [address]  °[place], [place] 1-877-615-2722 or 336-784-9470   °[facility] [address] [place], [place] 336-227-7417 Accepts Medicaid  °[facility] [address]  ° [place] 1-800-659-3381 Substance Abuse/Addiction Treatment  ° °[place] Behavioral Health Resources °Organization         Address  Phone  Notes  °[facility]  (888) 581-9988   °Julie Brannon, PhD [address] [place], [place]   (336) 349-5553 or (336) 951-0000   °[facility]   [address] °[place], [place] (336) 349-4454   °[facility] [address], [place], [place] (336) 342-8316 Insurance/Medicaid/sponsorship through [facility]  °[facility] [address]                                    [place], [place] (336) 342-8316 Therapy/tele-psych/case  °[facility] [address]  ° [place], [place] (336) 349-2233    °Dr. Arfeen  (336) 349-4544   °[facility] [place]  [employer] [place] [facility] 1) [address],  °2) [address], [place] °3)  [address], [place] (336) 349-3220 °(336) 342-7768 ° °(336) 342-8140   °[place] [facility] (336) 342-1394 or (336) 342-3537 (After Hours)    ° °\ ° °

## 2015-02-08 ENCOUNTER — Ambulatory Visit (INDEPENDENT_AMBULATORY_CARE_PROVIDER_SITE_OTHER): Admitting: Internal Medicine

## 2015-02-08 ENCOUNTER — Ambulatory Visit (INDEPENDENT_AMBULATORY_CARE_PROVIDER_SITE_OTHER)

## 2015-02-08 VITALS — BP 126/76 | HR 109 | Temp 98.3°F | Resp 18 | Ht 65.0 in | Wt 139.0 lb

## 2015-02-08 DIAGNOSIS — R059 Cough, unspecified: Secondary | ICD-10-CM

## 2015-02-08 DIAGNOSIS — R05 Cough: Secondary | ICD-10-CM

## 2015-02-08 DIAGNOSIS — F32A Depression, unspecified: Secondary | ICD-10-CM

## 2015-02-08 DIAGNOSIS — E876 Hypokalemia: Secondary | ICD-10-CM | POA: Diagnosis not present

## 2015-02-08 DIAGNOSIS — F1110 Opioid abuse, uncomplicated: Secondary | ICD-10-CM

## 2015-02-08 DIAGNOSIS — F329 Major depressive disorder, single episode, unspecified: Secondary | ICD-10-CM

## 2015-02-08 DIAGNOSIS — F1190 Opioid use, unspecified, uncomplicated: Secondary | ICD-10-CM

## 2015-02-08 DIAGNOSIS — F411 Generalized anxiety disorder: Secondary | ICD-10-CM | POA: Diagnosis not present

## 2015-02-08 MED ORDER — FLUTICASONE PROPIONATE 50 MCG/ACT NA SUSP: NASAL | Status: DC

## 2015-02-08 MED ORDER — BECLOMETHASONE DIPROPIONATE 40 MCG/ACT IN AERS: 2.0000 | INHALATION_SPRAY | Freq: Two times a day (BID) | RESPIRATORY_TRACT | Status: DC

## 2015-02-08 MED ORDER — ALPRAZOLAM 1 MG PO TABS: ORAL_TABLET | ORAL | Status: DC

## 2015-02-08 MED ORDER — ALPRAZOLAM ER 2 MG PO TB24: 2.0000 mg | ORAL_TABLET | ORAL | Status: DC

## 2015-02-08 NOTE — Progress Notes (Signed)
Subjective:  This chart was scribed for Allison Siaobert Roann Merk, MD by Allison Richard, ED Scribe. This patient was seen in room 13 and the patient's care was started at 3:10 PM.   Patient ID: Allison Richard, female    DOB: 05/07/1985, 89 y.o.   MRN: [id number]  HPI Chief Complaint  Patient presents with  . Anxiety  . Cough    chest pain   . Sore Throat     HPI Comments: Allison Richard is a 29 y.o. female who [facility] follow up. Pt was seen at the ED last night after having an anxiety while at work that she describes as an "out of body experience" as well as feeling uncoordinated and confused. Urine Rapid drug screen came back positive for opiates, cocaine, benzo and marijuana  but she states she has not done drugs in 4-5 days-denies coke ever.  Her potassium was also low and reports she has not been eating full meals. She was fired from her job today due to her having multiple anxiety attacks and for bringing her personal life to work. When she woke up this morning she did not feel anxious. Now that she will be out of work she plans to go to the [facility] as referred several weeks ago,and to find a new job.  She denies SOB or palpitation with anxiety attacks. Still taking Seroquel, klonopin and lexapro 40mg .      She also has productive cough with associated  nasal congestion, sore throat. She has SOB when laying down at night and has been using her inhaler for wheezing. Last CXR in 10/2014 which was normal. No fevers, chills, abdominal pain and nausea. These sxt have been present since august and have not responded to treatment\\ Patient Active Problem List   Diagnosis Date Noted  . 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 Atypical chest pain 11/04/2014  . Seizures (HCC) 12/22/2013  . Anxiety state 12/22/2013  . Depression 12/22/2013  . Depression with anxiety 12/08/2011  . Acne 12/08/2011  . Insomnia 12/08/2011  . Lip laceration 11/07/2011  . Open  wound of left upper arm 11/07/2011      Allergies  Allergen Reactions  . Tramadol Other (See Comments)    Seizures   . Vicodin [Hydrocodone-Acetaminophen] Itching, Nausea And Vomiting and Other (See Comments)    Reaction:  Hallucinations and bad dreams    Prior to Admission medications   Medication Sig Start Date End Date Taking? Authorizing Provider  albuterol (PROVENTIL HFA;VENTOLIN HFA) 108 (90 BASE) MCG/ACT inhaler Inhale 2 puffs into the lungs every 6 (six) hours as needed for wheezing or shortness of breath. 12/23/14  Yes Tonye Pearsonobert P Bellany Elbaum, MD  clonazePAM (KLONOPIN) 2 MG tablet Take 1 tablet (2 mg total) by mouth 3 (three) times daily as needed for anxiety. 01/25/15  Yes Tonye Pearsonobert P Laronica Bhagat, MD  cyclobenzaprine (FLEXERIL) 10 MG tablet Take 1 tablet (10 mg total) by mouth 3 (three) times daily as needed for muscle spasms. 01/25/15  Yes Tonye Pearsonobert P Miliana Gangwer, MD  escitalopram (LEXAPRO) 20 MG tablet Take 2 tablets (40 mg total) by mouth daily. 01/25/15  Yes Tonye Pearsonobert P [place]ardonn[place] Holzmann, MD  meloxicam (MOBIC) 15 MG tablet Take 1 tablet (15 mg total) by mouth daily. As needed for back pain 01/25/15  Yes Tonye Pearsonobert P Gianlucas Evenson, MD  Naloxone HCl 0.4 MG/0.4ML SOAJ 1 dose into the anterolateral thigh at time of overdose 01/15/15  Yes Tonye Pearsonobert P Imogen Maddalena, MD  QUEtiapine (SEROQUEL) 300  MG tablet Take 1 tablet (300 mg total) by mouth at bedtime. 12/07/14  Yes Tonye Pearson, MD  acyclovir (ZOVIRAX) 200 MG capsule Take two tablets three times a day for 5 days for each outbreak. Patient not taking: Reported on 02/07/2015 12/28/14   Marlis Edelson, CNM  fluticasone [facility]) 50 MCG/ACT nasal spray 2 sprays each nostril once a day Patient not taking: Reported on 02/08/2015 12/07/14   Tonye Pearson, MD  promethazine (PHENERGAN) 25 MG tablet Take 1 tablet (25 mg total) by mouth every 6 (six) hours as needed for nausea or vomiting. Patient not taking: Reported on 02/08/2015 01/02/15   Judeth Horn, NP   Review  of Systems  Constitutional: Negative for fever and chills.  HENT: Positive for sore throat.   Respiratory: Positive for cough, shortness of breath and wheezing.   Gastrointestinal: Negative for nausea and vomiting.  Psychiatric/Behavioral: The patient is nervous/anxious.    Objective:   Physical Exam  Constitutional: She is oriented to person, place, and time. She appears well-developed and well-nourished. No distress.  HENT:  Head: Normocephalic and atraumatic.  Right Ear: Tympanic membrane normal.  Left Ear: Tympanic membrane normal.  Nose: Mucosal edema and rhinorrhea present. Right sinus exhibits no maxillary sinus tenderness. Left sinus exhibits no maxillary sinus tenderness.  Mouth/Throat: Posterior oropharyngeal erythema present. No oropharyngeal exudate.  Eyes: Conjunctivae and EOM are normal.  Neck: Neck supple.  Cardiovascular: Normal rate, regular rhythm, normal heart sounds and intact distal pulses.   No murmur heard. Pulmonary/Chest: Effort normal and breath sounds normal. She has no wheezes. She has no rales.  Musculoskeletal: Normal range of motion. She exhibits no edema.  Neurological: She is alert and oriented to person, place, and time. No cranial nerve deficit.  Skin: Skin is warm and dry.  Psychiatric: Her behavior is normal. Thought content normal.  Is tearful--feels lost-not desparate or suicidal but overwhelmed at inability to get life together.  Nursing note and vitals reviewed.   Filed Vitals:   02/08/15 1446  BP: 126/76  Pulse: 109  Temp: 98.3 F (36.8 C)  TempSrc: Oral  Resp: 18  Height:  (1.651 m)  Weight: 139 lb (63.05 kg)  SpO2: 97%   [facility] reading (PRIMARY) by Dr. Merla Riches. CXR-no active infiltrate or mass.  Assessment & Plan:    1. Cough --?allergic rhinitis withRAD---start qvar and flonase  2. Hypokalemia ---K rich foods  3. GAD (generalized anxiety disorder) ---change back to xanax- but use longacting w/ prn panic  -has disposed  of leftover klon  4. Depression --appropriate given life!!  5.      [facility]  Needs deep involv in counseling or will fail  Meds ordered this encounter  Medications  . beclomethasone (QVAR) 40 MCG/ACT inhaler    Sig: Inhale 2 puffs into the lungs 2 (two) times daily.    Dispense:  1 Inhaler    Refill:  12  . fluticasone (FLONASE) 50 MCG/ACT nasal spray    Sig: 2 sprays each nostril once a day    Dispense:  16 g    Refill:  6  . ALPRAZolam (XANAX XR) 2 MG 24 hr tablet    Sig: Take 1 tablet (2 mg total) by mouth every morning.    Dispense:  30 tablet    Refill:  0  . ALPRAZolam (XANAX) 1 MG tablet    Sig: Once a day if needed for panic attack    Dispense:  30 tablet  Refill:  0  fu 3-4 weeks  By signing my name below, I, Raven Richard, attest that this documentation has been prepared under the direction and in the presence of Allison Sia, MD.  Electronically Signed: Andrew Au, ED Scribe. 02/08/2015. 3:43 PM.  I have completed the patient encounter in its entirety as documented by the scribe, with editing by me where necessary. Rhaya Coale P. Merla Riches, M.D.

## 2015-02-17 ENCOUNTER — Telehealth: Payer: Self-pay

## 2015-02-17 MED ORDER — FLUTICASONE PROPIONATE HFA 110 MCG/ACT IN AERO: 1.0000 | INHALATION_SPRAY | Freq: Two times a day (BID) | RESPIRATORY_TRACT | Status: DC

## 2015-02-17 NOTE — Telephone Encounter (Signed)
Pharm faxed notice that ins doesn't cover QVAR w/out PA and pt having tried the preferred alternative, which appears to be Flovent Diskus or HFA. Dr Merla Richesoolittle, please advise.

## 2015-02-17 NOTE — Telephone Encounter (Signed)
flovwnt hfa sent

## 2015-02-22 ENCOUNTER — Encounter: Payer: Self-pay | Admitting: Internal Medicine

## 2015-02-22 ENCOUNTER — Ambulatory Visit (INDEPENDENT_AMBULATORY_CARE_PROVIDER_SITE_OTHER): Admitting: Family Medicine

## 2015-02-22 VITALS — BP 100/68 | HR 92 | Temp 97.8°F | Resp 18 | Ht 65.5 in | Wt 140.0 lb

## 2015-02-22 DIAGNOSIS — Z3009 Encounter for other general counseling and advice on contraception: Secondary | ICD-10-CM | POA: Diagnosis not present

## 2015-02-22 DIAGNOSIS — F1910 Other psychoactive substance abuse, uncomplicated: Secondary | ICD-10-CM

## 2015-02-22 DIAGNOSIS — Z5181 Encounter for therapeutic drug level monitoring: Secondary | ICD-10-CM | POA: Diagnosis not present

## 2015-02-22 DIAGNOSIS — F418 Other specified anxiety disorders: Secondary | ICD-10-CM | POA: Diagnosis not present

## 2015-02-22 DIAGNOSIS — S61412A Laceration without foreign body of left hand, initial encounter: Secondary | ICD-10-CM | POA: Diagnosis not present

## 2015-02-22 LAB — POCT URINE PREGNANCY: Preg Test, Ur: NEGATIVE

## 2015-02-22 MED ORDER — NORGESTIMATE-ETH ESTRADIOL 0.25-35 MG-MCG PO TABS: 1.0000 | ORAL_TABLET | Freq: Every day | ORAL | Status: DC
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
## 2015-02-22 NOTE — Patient Instructions (Signed)
Continue on your current medication regimen. Start the birth control. Dr. Merla Riches will be present in the clinic Tuesday 11/28 2-8 pm And then will be present Dec 10 8:30-3 and Dec 11 8 - 3 If your UDS is appropriate and it is ok with Dr. Merla Riches, I will be willing to increase your XR Xanax for short term- 2 weeks - to give you time to get established at the [facility] but would expect confirmation of your doing this prior to any medication refills here.   DO THIS TODAY STRAIGHT FROM LEAVING HERE - JUST GET IT OVER WITH!!!! [facility] Address: [address] [place], [place], [place] [address]  Phone: [phone]  Hours:  Open today  9AM-9PM Tissue Adhesive Wound Care Some cuts, wounds, lacerations, and incisions can be repaired by using tissue adhesive. Tissue adhesive is like glue. It holds the skin together, allowing for faster healing. It forms a strong bond on the skin in about 1 minute and reaches its full strength in about 2 or 3 minutes. The adhesive disappears naturally while the wound is healing. It is important to take proper care of your wound at home while it heals.  HOME CARE INSTRUCTIONS   Showers are allowed. Do not soak the area containing the tissue adhesive. Do not take baths, swim, or use hot tubs. Do not use any soaps or ointments on the wound. Certain ointments can weaken the glue.  If a bandage (dressing) has been applied, follow your health care provider's instructions for how often to change the dressing.   Keep the dressing dry if one has been applied.   Do not scratch, pick, or rub the adhesive.   Do not place tape over the adhesive. The adhesive could come off when pulling the tape off.   Protect the wound from further injury until it is healed.   Protect the wound from sun and tanning bed exposure while it is healing and for several weeks after healing.   Only take over-the-counter or prescription medicines as directed by your health care  provider.   Keep all follow-up appointments as directed by your health care provider. SEEK IMMEDIATE MEDICAL CARE IF:   Your wound becomes red, swollen, hot, or tender.   You develop a rash after the glue is applied.  You have increasing pain in the wound.   You have a red streak that goes away from the wound.   You have pus coming from the wound.   You have increased bleeding.  You have a fever.  You have shaking chills.   You notice a bad smell coming from the wound.   Your wound or adhesive breaks open.  MAKE SURE YOU:   Understand these instructions.  Will watch your condition.  Will get help right away if you are not doing well or get worse.   This information is not intended to replace advice given to you by your health care provider. Make sure you discuss any questions you have with your health care provider.   Document Released: 09/06/2000 Document Revised: 01/01/2013 Document Reviewed: 10/02/2012 Elsevier Interactive Patient Education 2016 Elsevier Inc.  Oral Contraception Use Oral contraceptive pills (OCPs) are medicines taken to prevent pregnancy. OCPs work by preventing the ovaries from releasing eggs. The hormones in OCPs also cause the cervical mucus to thicken, preventing the sperm from entering the uterus. The hormones also cause the uterine lining to become thin, not allowing a fertilized egg to attach to the inside of  the uterus. OCPs are highly effective when taken exactly as prescribed. However, OCPs do not prevent sexually transmitted diseases (STDs). Safe sex practices, such as using condoms along with an OCP, can help prevent STDs. Before taking OCPs, you may have a physical exam and Pap test. Your health care provider may also order blood tests if necessary. Your health care provider will make sure you are a good candidate for oral contraception. Discuss with your health care provider the possible side effects of the OCP you may be prescribed.  When starting an OCP, it can take 2 to 3 months for the body to adjust to the changes in hormone levels in your body.  HOW TO TAKE ORAL CONTRACEPTIVE PILLS Your health care provider may advise you on how to start taking the first cycle of OCPs. Otherwise, you can:   Start on day 1 of your menstrual period. You will not need any backup contraceptive protection with this start time.   Start on the first Sunday after your menstrual period or the day you get your prescription. In these cases, you will need to use backup contraceptive protection for the first week.   Start the pill at any time of your cycle. If you take the pill within 5 days of the start of your period, you are protected against pregnancy right away. In this case, you will not need a backup form of birth control. If you start at any other time of your menstrual cycle, you will need to use another form of birth control for 7 days. If your OCP is the type called a minipill, it will protect you from pregnancy after taking it for 2 days (48 hours). After you have started taking OCPs:   If you forget to take 1 pill, take it as soon as you remember. Take the next pill at the regular time.   If you miss 2 or more pills, call your health care provider because different pills have different instructions for missed doses. Use backup birth control until your next menstrual period starts.   If you use a 28-day pack that contains inactive pills and you miss 1 of the last 7 pills (pills with no hormones), it will not matter. Throw away the rest of the non-hormone pills and start a new pill pack.  No matter which day you start the OCP, you will always start a new pack on that same day of the week. Have an extra pack of OCPs and a backup contraceptive method available in case you miss some pills or lose your OCP pack.  HOME CARE INSTRUCTIONS   Do not smoke.   Always use a condom to protect against STDs. OCPs do not protect against STDs.    Use a calendar to mark your menstrual period days.   Read the information and directions that came with your OCP. Talk to your health care provider if you have questions.  SEEK MEDICAL CARE IF:   You develop nausea and vomiting.   You have abnormal vaginal discharge or bleeding.   You develop a rash.   You miss your menstrual period.   You are losing your hair.   You need treatment for mood swings or depression.   You get dizzy when taking the OCP.   You develop acne from taking the OCP.   You become pregnant.  SEEK IMMEDIATE MEDICAL CARE IF:   You develop chest pain.   You develop shortness of breath.   You have an uncontrolled  or severe headache.   You develop numbness or slurred speech.   You develop visual problems.   You develop pain, redness, and swelling in the legs.    This information is not intended to replace advice given to you by your health care provider. Make sure you discuss any questions you have with your health care provider.   Document Released: 03/02/2011 Document Revised: 04/03/2014 Document Reviewed: 09/01/2012 Elsevier Interactive Patient Education 2016 [employer].  Oral Contraception Information Oral contraceptive pills (OCPs) are medicines taken to prevent pregnancy. OCPs work by preventing the ovaries from releasing eggs. The hormones in OCPs also cause the cervical mucus to thicken, preventing the sperm from entering the uterus. The hormones also cause the uterine lining to become thin, not allowing a fertilized egg to attach to the inside of the uterus. OCPs are highly effective when taken exactly as prescribed. However, OCPs do not prevent sexually transmitted diseases (STDs). Safe sex practices, such as using condoms along with the pill, can help prevent STDs.  Before taking the pill, you may have a physical exam and Pap test. Your health care provider may order blood tests. The health care provider will make sure you  are a good candidate for oral contraception. Discuss with your health care provider the possible side effects of the OCP you may be prescribed. When starting an OCP, it can take 2 to 3 months for the body to adjust to the changes in hormone levels in your body.  TYPES OF ORAL CONTRACEPTION  The combination pill--This pill contains estrogen and progestin (synthetic progesterone) hormones. The combination pill comes in 21-day, 28-day, or 91-day packs. Some types of combination pills are meant to be taken continuously (365-day pills). With 21-day packs, you do not take pills for 7 days after the last pill. With 28-day packs, the pill is taken every day. The last 7 pills are without hormones. Certain types of pills have more than 21 hormone-containing pills. With 91-day packs, the first 84 pills contain both hormones, and the last 7 pills contain no hormones or contain estrogen only.  The minipill--This pill contains the progesterone hormone only. The pill is taken every day continuously. It is very important to take the pill at the same time each day. The minipill comes in packs of 28 pills. All 28 pills contain the hormone.  ADVANTAGES OF ORAL CONTRACEPTIVE PILLS  Decreases premenstrual symptoms.   Treats menstrual period cramps.   Regulates the menstrual cycle.   Decreases a heavy menstrual flow.   May treatacne, depending on the type of pill.   Treats abnormal uterine bleeding.   Treats polycystic ovarian syndrome.   Treats endometriosis.   Can be used as emergency contraception.  THINGS THAT CAN MAKE ORAL CONTRACEPTIVE PILLS LESS EFFECTIVE OCPs can be less effective if:   You forget to take the pill at the same time every day.   You have a stomach or intestinal disease that lessens the absorption of the pill.   You take OCPs with other medicines that make OCPs less effective, such as antibiotics, certain HIV medicines, and some seizure medicines.   You take expired  OCPs.   You forget to restart the pill on day 7, when using the packs of 21 pills.  RISKS ASSOCIATED WITH ORAL CONTRACEPTIVE PILLS  Oral contraceptive pills can sometimes cause side effects, such as:  Headache.  Nausea.  Breast tenderness.  Irregular bleeding or spotting. Combination pills are also associated with a small increased risk of:  Blood clots.  Heart attack.  Stroke.   This information is not intended to replace advice given to you by your health care provider. Make sure you discuss any questions you have with your health care provider.   Document Released: 06/03/2002 Document Revised: 01/01/2013 Document Reviewed: 09/01/2012 Elsevier Interactive Patient Education [employer]2016 Elsevier Inc.

## 2015-02-23 ENCOUNTER — Ambulatory Visit (INDEPENDENT_AMBULATORY_CARE_PROVIDER_SITE_OTHER): Admitting: Internal Medicine

## 2015-02-23 VITALS — BP 115/82 | HR 95 | Temp 98.3°F | Resp 18 | Wt 140.4 lb

## 2015-02-23 DIAGNOSIS — G4700 Insomnia, unspecified: Secondary | ICD-10-CM | POA: Diagnosis not present

## 2015-02-23 DIAGNOSIS — F411 Generalized anxiety disorder: Secondary | ICD-10-CM

## 2015-02-23 DIAGNOSIS — F1110 Opioid abuse, uncomplicated: Secondary | ICD-10-CM

## 2015-02-23 DIAGNOSIS — F1190 Opioid use, unspecified, uncomplicated: Secondary | ICD-10-CM

## 2015-02-23 MED ORDER — ZALEPLON 10 MG PO CAPS: 10.0000 mg | ORAL_CAPSULE | Freq: Every evening | ORAL | Status: DC | PRN

## 2015-02-23 MED ORDER — ALPRAZOLAM ER 3 MG PO TB24: 3.0000 mg | ORAL_TABLET | ORAL | Status: DC

## 2015-02-23 MED ORDER — ALPRAZOLAM 1 MG PO TABS: ORAL_TABLET | ORAL | Status: DC

## 2015-02-23 NOTE — Progress Notes (Signed)
   Subjective:    Patient ID: Allison Richard, female    DOB: 04/28/1985, 29 y.o.   MRN: [id number] This chart was scribed for Ellamae Siaobert Kaaren Nass, MD by Jolene Provostobert Halas, Medical Scribe. This patient was seen in Room 13 and the patient's care was started a 2:49 PM.  Chief Complaint  Patient presents with  . Follow-up    hand injury    HPI HPI Comments: Allison Richard is a 29 y.o. female [facility] up due to a self inflicted laceration to her left interdigital space between the thumb and pointer finger. [facility] to be treated for that injury. At that time she states she did not have SI, and stated she was not sure why she cut herself. She stated at that time she was not even aware of cutting herself until after she had done it. She states the wound is open as the glue failed   She states she wakes up every morning at 4am and cannot go back to sleep, so she has been taking a 1mg  xanex at that time to go back to sleep. In addition to her regular 2 mg long-acting. She also takes occasional daytime dose panic. 1 mg. Her anxiety still not stabilized She [facility]  She also states that she is continuing to have difficulty finding another job, because she has a hard time getting past her anxiety and getting motivated.   No further heroin use. Also denies heroin use at last ER presentation although opiates positive.  Review of Systems  Constitutional: Negative for fever and chills.  Skin: Positive for color change and wound.  Psychiatric/Behavioral: Positive for sleep disturbance. The patient is nervous/anxious.       Objective:  BP 115/82 mmHg  Pulse 95  Temp(Src) 98.3 F (36.8 C) (Oral)  Resp 18  Wt 140 lb 6.4 oz (63.685 kg)  SpO2 97%  LMP 01/26/2015  Physical Exam  Constitutional: She is oriented to person, place, and time. She appears well-developed and well-nourished.  No distress.  HENT:  Head: Normocephalic and atraumatic.  Eyes: Pupils are equal, round, and reactive to light.  Neck: Neck supple.  Cardiovascular: Normal rate.   Pulmonary/Chest: Effort normal. No respiratory distress.  Musculoskeletal: Normal range of motion.  Neurological: She is alert and oriented to person, place, and time. Coordination normal.  Skin: Skin is warm and dry. She is not diaphoretic.  The wound on her hand is linear and slightly open but healing well by secondary intention without sign of infection  Psychiatric: She has a normal mood and affect. Her behavior is normal.  Nursing note and vitals reviewed.     Assessment & Plan:  GAD (generalized anxiety disorder)-increase Xanax extended release to 3 mg Continue 1 mg Xanax daily needed for panic attack  Insomnia--falls asleep well with Seroquel but wakes early.-And sonata 10 mg when wakes  Heroin use--she says clean 90 d//[facility] can evaluate medicines as well    I have completed the patient encounter in its entirety as documented by the scribe, with editing by me where necessary. Shanequia Kendrick P. Merla Richesoolittle, M.D.   By signing my name below, I, Javier Dockerobert Ryan Halas, attest that this documentation has been prepared under the direction and in the presence of Ellamae Siaobert Genevia Bouldin, MD. Electronically Signed: Javier Dockerobert Ryan Halas, ER Scribe. 02/23/2015. 2:49 PM.

## 2015-02-26 LAB — BENZODIAZEPINES (GC/LC/MS), URINE
Alprazolam metabolite (GC/LC/MS), ur confirm: 1093 ng/mL — AB (ref <25)
CLONAZEPAU: NEGATIVE ng/mL (ref <25)
Flurazepam metabolite (GC/LC/MS), ur confirm: NEGATIVE ng/mL (ref <50)
Lorazepam (GC/LC/MS), ur confirm: NEGATIVE ng/mL (ref <50)
MIDAZOLAMU: NEGATIVE ng/mL (ref <50)
Nordiazepam (GC/LC/MS), ur confirm: NEGATIVE ng/mL (ref <50)
Oxazepam (GC/LC/MS), ur confirm: NEGATIVE ng/mL (ref <50)
TRIAZOLAMU: NEGATIVE ng/mL (ref <50)
Temazepam (GC/LC/MS), ur confirm: NEGATIVE ng/mL (ref <50)

## 2015-02-27 LAB — PRESCRIPTION MONITORING PROFILE (10 PANEL)
AMPHETAMINE/METH: NEGATIVE ng/mL
BARBITURATE SCREEN, URINE: NEGATIVE ng/mL
Buprenorphine, Urine: NEGATIVE ng/mL
CANNABINOID SCRN UR: NEGATIVE ng/mL
COCAINE METABOLITES: NEGATIVE ng/mL
Creatinine, Urine: 101.74 mg/dL (ref >20.0)
METHADONE SCREEN, URINE: NEGATIVE ng/mL
Nitrites, Initial: NEGATIVE ug/mL
OPIATE SCREEN, URINE: NEGATIVE ng/mL
Oxycodone Screen, Ur: NEGATIVE ng/mL
PH URINE, INITIAL: 6.4 pH (ref 4.5–8.9)
Propoxyphene: NEGATIVE ng/mL

## 2015-03-03 ENCOUNTER — Ambulatory Visit: Admitting: Internal Medicine

## 2015-03-05 ENCOUNTER — Ambulatory Visit: Admitting: Internal Medicine

## 2015-03-10 ENCOUNTER — Ambulatory Visit (INDEPENDENT_AMBULATORY_CARE_PROVIDER_SITE_OTHER): Admitting: Internal Medicine

## 2015-03-10 ENCOUNTER — Encounter: Payer: Self-pay | Admitting: Internal Medicine

## 2015-03-10 VITALS — BP 99/64 | HR 94 | Temp 97.9°F | Resp 16 | Ht 65.5 in | Wt 136.0 lb

## 2015-03-10 DIAGNOSIS — F411 Generalized anxiety disorder: Secondary | ICD-10-CM

## 2015-03-10 DIAGNOSIS — F1190 Opioid use, unspecified, uncomplicated: Secondary | ICD-10-CM

## 2015-03-10 DIAGNOSIS — F1110 Opioid abuse, uncomplicated: Secondary | ICD-10-CM

## 2015-03-10 DIAGNOSIS — F32A Depression, unspecified: Secondary | ICD-10-CM

## 2015-03-10 DIAGNOSIS — J0100 Acute maxillary sinusitis, unspecified: Secondary | ICD-10-CM | POA: Diagnosis not present

## 2015-03-10 DIAGNOSIS — J4520 Mild intermittent asthma, uncomplicated: Secondary | ICD-10-CM | POA: Diagnosis not present

## 2015-03-10 DIAGNOSIS — F329 Major depressive disorder, single episode, unspecified: Secondary | ICD-10-CM

## 2015-03-10 MED ORDER — QUETIAPINE FUMARATE ER 400 MG PO TB24: 400.0000 mg | ORAL_TABLET | Freq: Every day | ORAL | Status: DC

## 2015-03-10 MED ORDER — PREDNISONE 20 MG PO TABS: ORAL_TABLET | ORAL | Status: DC

## 2015-03-10 MED ORDER — ALPRAZOLAM 1 MG PO TABS: ORAL_TABLET | ORAL | Status: DC

## 2015-03-10 MED ORDER — AMOXICILLIN 500 MG PO CAPS: 1000.0000 mg | ORAL_CAPSULE | Freq: Two times a day (BID) | ORAL | Status: AC

## 2015-03-11 NOTE — Progress Notes (Signed)
   Subjective:    Patient ID: Allison Richard, female    DOB: 01/03/1986, 29 y.o.   MRN: [id number]  HPIf/u Patient Active Problem List   Diagnosis Date Noted  . Heroin use---none since last OV 11/26/2014  . Atypical chest pain--ditto 11/04/2014  . Seizures (HCC)-ditto 12/22/2013  . Anxiety state---no luck with XR xanax--still constant all day 12/22/2013  . Depression--stable 12/22/2013  . Insomnia--better on seroquel 12/08/2011   Still having cough/congestion--see recent rx/on off 3 m No fever Still no job so stressed   Review of Systems  Constitutional: Negative for fever, activity change, appetite change, fatigue and unexpected weight change.  HENT: Negative for trouble swallowing.   Eyes: Negative for visual disturbance.  Respiratory: Positive for cough. Negative for chest tightness, shortness of breath and wheezing.   Cardiovascular: Negative for chest pain, palpitations and leg swelling.  Gastrointestinal: Negative for abdominal pain.  Genitourinary: Negative for dysuria and difficulty urinating.  Musculoskeletal: Positive for back pain.       Continues with pain lumbar w/ activ  Neurological: Negative for dizziness and headaches.  Psychiatric/Behavioral: Negative for suicidal ideas and self-injury.       Objective:   Physical Exam BP 99/64 mmHg  Pulse 94  Temp(Src) 97.9 F (36.6 C)  Resp 16  Ht 5' 5.5" (1.664 m)  Wt 136 lb (61.689 kg)  BMI 22.28 kg/m2  LMP 01/26/2015 PERRLA/EOMs conj Tms clear Nares boggy w/ purulent ,mucus and tend max areas to perc thr clear No c nodes Chest clear except wheezing bilat w/ FExpir Mood stable today      Assessment & Plan:  Reactive airway disease, mild intermittent, uncomplicated Acute maxillary sinusitis, recurrence not specified  GAD (generalized anxiety disorder)--chg back to vanax 1 tid(w 2 hs) -incr seroquel to 400---(may need 800)  Heroin use--stabilized  Depression--ok    Meds ordered this encounter   Medications  . amoxicillin (AMOXIL) 500 MG capsule    Sig: Take 2 capsules (1,000 mg total) by mouth 2 (two) times daily.    Dispense:  40 capsule    Refill:  0  . predniSONE (DELTASONE) 20 MG tablet    Sig: 3/3/2/2/1/1 single daily dose for 6 days    Dispense:  12 tablet    Refill:  0  . QUEtiapine (SEROQUEL XR) 400 MG 24 hr tablet    Sig: Take 1 tablet (400 mg total) by mouth at bedtime.    Dispense:  30 tablet    Refill:  1  . ALPRAZolam (XANAX) 1 MG tablet    Sig: 1 am, 1midday, 2 hs    Dispense:  120 tablet    Refill:  1   F/u 1 m

## 2015-03-15 ENCOUNTER — Telehealth: Payer: Self-pay

## 2015-03-15 NOTE — Telephone Encounter (Signed)
Doolittle,   Cough is worse, just saw you, taking antibiotics and predisone.  [phone]

## 2015-03-15 NOTE — Telephone Encounter (Signed)
Yes but if not making progress I can see her Tuesday 3-close and do Xray

## 2015-03-15 NOTE — Telephone Encounter (Signed)
Seems like she is one the right treatment. Any other suggestions?

## 2015-03-16 NOTE — Telephone Encounter (Signed)
Called pt and advised message from provider on their voicemail.  

## 2015-04-07 ENCOUNTER — Telehealth: Payer: Self-pay

## 2015-04-07 NOTE — Telephone Encounter (Signed)
Alcide GoodnessBrady, pharm from [facility] called to verify the dose of Lexapro. He stated that the maximum adult daily dose is 20 mg, and he is uncomfortable filling Rx for 2 tabs QD. Dr Merla Richesoolittle, please advise.

## 2015-04-08 ENCOUNTER — Telehealth: Payer: Self-pay | Admitting: Internal Medicine

## 2015-04-08 MED ORDER — ESCITALOPRAM OXALATE 20 MG PO TABS: ORAL_TABLET | ORAL | Status: DC

## 2015-04-08 NOTE — Telephone Encounter (Signed)
Left message about changing lexapro dose and adding other meds if needed  Should take 1.5 tabs for 7 days then go to 1 tab as regular dose

## 2015-04-08 NOTE — Telephone Encounter (Signed)
Note--i cleared this up and sent note another way

## 2015-04-09 NOTE — Telephone Encounter (Signed)
Pt advised and understood. 

## 2015-04-14 ENCOUNTER — Encounter (HOSPITAL_COMMUNITY): Payer: Self-pay | Admitting: Emergency Medicine

## 2015-04-14 ENCOUNTER — Emergency Department (INDEPENDENT_AMBULATORY_CARE_PROVIDER_SITE_OTHER)
Admission: EM | Admit: 2015-04-14 | Discharge: 2015-04-14 | Disposition: A | Discharge status: Home / Self Care | Source: Home / Self Care | Attending: Emergency Medicine | Admitting: Emergency Medicine

## 2015-04-14 DIAGNOSIS — I889 Nonspecific lymphadenitis, unspecified: Secondary | ICD-10-CM | POA: Diagnosis not present

## 2015-04-14 MED ORDER — CEPHALEXIN 500 MG PO CAPS: 500.0000 mg | ORAL_CAPSULE | Freq: Four times a day (QID) | ORAL | Status: DC
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
## 2015-04-14 NOTE — ED Notes (Signed)
The patient presented to the [facility] with a swollen gland under her chin that has been there for an undetermined amount of time.

## 2015-04-14 NOTE — Discharge Instructions (Signed)
You either have an infected lymph node or a blocked gland. Take Keflex 4 times a day for 1 week. Suck on sour candies to stimulate the salivary glands. You should see improvement over the next 2-3 days. If you develop fevers, difficulty swallowing, or the node is getting larger, please come back or see your primary care doctor.

## 2015-04-23 ENCOUNTER — Ambulatory Visit (INDEPENDENT_AMBULATORY_CARE_PROVIDER_SITE_OTHER): Admitting: Internal Medicine

## 2015-04-23 ENCOUNTER — Telehealth: Payer: Self-pay

## 2015-04-23 ENCOUNTER — Encounter: Payer: Self-pay | Admitting: Internal Medicine

## 2015-04-23 VITALS — BP 122/83 | HR 104 | Temp 98.9°F | Resp 18 | Ht 66.0 in | Wt 143.0 lb

## 2015-04-23 DIAGNOSIS — B354 Tinea corporis: Secondary | ICD-10-CM | POA: Diagnosis not present

## 2015-04-23 DIAGNOSIS — L0201 Cutaneous abscess of face: Secondary | ICD-10-CM

## 2015-04-23 DIAGNOSIS — F418 Other specified anxiety disorders: Secondary | ICD-10-CM

## 2015-04-23 MED ORDER — FLUCONAZOLE 150 MG PO TABS: ORAL_TABLET | ORAL | Status: DC

## 2015-04-23 MED ORDER — KETOCONAZOLE 2 % EX CREA: 1.0000 "application " | TOPICAL_CREAM | Freq: Every day | CUTANEOUS | Status: DC

## 2015-04-23 MED ORDER — QUETIAPINE FUMARATE ER 400 MG PO TB24: 400.0000 mg | ORAL_TABLET | Freq: Every day | ORAL | Status: DC

## 2015-04-23 MED ORDER — ALPRAZOLAM 2 MG PO TABS: ORAL_TABLET | ORAL | Status: DC

## 2015-04-23 MED ORDER — CLINDAMYCIN HCL 300 MG PO CAPS: 300.0000 mg | ORAL_CAPSULE | Freq: Three times a day (TID) | ORAL | Status: DC

## 2015-04-23 MED ORDER — QUETIAPINE FUMARATE 200 MG PO TABS: 200.0000 mg | ORAL_TABLET | Freq: Every day | ORAL | Status: DC

## 2015-04-23 MED ORDER — ESCITALOPRAM OXALATE 20 MG PO TABS: 20.0000 mg | ORAL_TABLET | Freq: Every day | ORAL | Status: DC
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
## 2015-04-23 NOTE — Telephone Encounter (Signed)
Patient was expecting a pain medication to be prescribed by Dr. Merla Riches, she is not sure she got it today when she came in.   [phone] (M)

## 2015-05-14 ENCOUNTER — Ambulatory Visit (INDEPENDENT_AMBULATORY_CARE_PROVIDER_SITE_OTHER): Admitting: Internal Medicine

## 2015-05-14 VITALS — BP 112/82 | HR 102 | Temp 98.2°F | Resp 18 | Ht 65.5 in | Wt 146.0 lb

## 2015-05-14 DIAGNOSIS — F418 Other specified anxiety disorders: Secondary | ICD-10-CM

## 2015-05-14 MED ORDER — ALPRAZOLAM 2 MG PO TABS: 2.0000 mg | ORAL_TABLET | Freq: Three times a day (TID) | ORAL | Status: DC | PRN

## 2015-05-14 MED ORDER — ALBUTEROL SULFATE HFA 108 (90 BASE) MCG/ACT IN AERS: 2.0000 | INHALATION_SPRAY | Freq: Four times a day (QID) | RESPIRATORY_TRACT | Status: DC | PRN
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
## 2015-06-02 ENCOUNTER — Ambulatory Visit (INDEPENDENT_AMBULATORY_CARE_PROVIDER_SITE_OTHER): Admitting: Internal Medicine

## 2015-06-02 VITALS — BP 113/73 | HR 105 | Temp 97.9°F | Resp 16 | Ht 66.0 in | Wt 146.0 lb

## 2015-06-02 DIAGNOSIS — F411 Generalized anxiety disorder: Secondary | ICD-10-CM

## 2015-06-02 DIAGNOSIS — F418 Other specified anxiety disorders: Secondary | ICD-10-CM

## 2015-06-02 DIAGNOSIS — G4700 Insomnia, unspecified: Secondary | ICD-10-CM | POA: Diagnosis not present

## 2015-06-02 MED ORDER — ALBUTEROL SULFATE HFA 108 (90 BASE) MCG/ACT IN AERS: 2.0000 | INHALATION_SPRAY | Freq: Four times a day (QID) | RESPIRATORY_TRACT | Status: DC | PRN

## 2015-06-02 MED ORDER — QUETIAPINE FUMARATE 200 MG PO TABS: 200.0000 mg | ORAL_TABLET | Freq: Every day | ORAL | Status: DC

## 2015-06-02 MED ORDER — ALPRAZOLAM 2 MG PO TABS: 2.0000 mg | ORAL_TABLET | Freq: Three times a day (TID) | ORAL | Status: DC | PRN

## 2015-06-02 NOTE — Patient Instructions (Signed)
Allison LennertSarah Weber [facility]

## 2015-06-03 ENCOUNTER — Encounter: Payer: Self-pay | Admitting: Internal Medicine

## 2015-06-03 NOTE — Progress Notes (Signed)
   Subjective:    Patient ID: Allison MinisterKatherine Ruiz-Tyson, female    DOB: 09/27/1985, 30 y.o.   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 No further neuro events Has remain drug free anx doing well with meds Getting along with Mom better Son's Dad out of jail but under house arrest and now facing new charges for stealing auto  Continues on Bactr and cleo for submental mass that is slowly resolving--[facility]=no growth Review of Systems Disappointed with her wt gain Wt Readings from Last 3 Encounters:  06/02/15 146 lb (66.225 kg)  05/14/15 146 lb (66.225 kg)  04/23/15 143 lb (64.864 kg)  was 136 1 yr ago Still needs occas inhaler after her RAD--but couldn't afford 50$ at last attempt to refill    Objective:   Physical Exam BP 113/73 mmHg  Pulse 105  Temp(Src) 97.9 F (36.6 C)  Resp 16  Ht 5\' 6"  (1.676 m)  Wt 146 lb (66.225 kg)  BMI 23.58 kg/m2  Reck pulse 75,reg 1cm movable firm sl tender mass remains Mood stable/affect appr/judgement sound/tho con wnl      Assessment & Plan:  Depression with anxiety  Insomnia  Anxiety state  Disc seroquel and wt gain//need for carb restric and daily exercise  Meds ordered this encounter  Medications  . QUEtiapine (SEROQUEL) 200 MG tablet-------    Sig: Take 1 tablet (200 mg total) by mouth at bedtime.    Dispense:  30 tablet    Refill:  5  . alprazolam (XANAX) 2 MG tablet    Sig: Take 1 tablet (2 mg total) by mouth 3 (three) times daily as needed for sleep. 1 am, 1/2 midday, 1 hs    Dispense:  90 tablet    Refill:  1  . albuterol (PROVENTIL HFA;VENTOLIN HFA) 108 (90 Base) MCG/ACT inhaler    Sig: Inhale 2 puffs into the lungs every 6 (six) hours as needed for wheezing or shortness of breath.    Dispense:  1 Inhaler    Refill:  5   F/u 2 mo Disc [facility]

## 2015-07-19 ENCOUNTER — Telehealth: Payer: Self-pay

## 2015-07-19 NOTE — Telephone Encounter (Signed)
Msg is for [facility], Pharmacy only has half of perscription and they got a new system and they all got sent back. So patient was wondering if we received a fax from the pharmacy and also to see if someone could refill her Xanax since doolittle will be out for the whole week.  Please advise  [phone]

## 2015-07-20 NOTE — Telephone Encounter (Signed)
Patient called back into clinic today stating that the Pharmacy sent all her meds back and now she doesn't have any and she only has 4 pills left and she doesn't want to go through withdrawal from running out of the pills. She would like someone else to refill it if [facility] can not. Can someone call her and speak with her about what needs to be done about this refill. Thank you!  Her call back number is [phone]

## 2015-07-21 NOTE — Telephone Encounter (Signed)
I spoke to pharmacist who reported that the only problem was with the alprazolam Rx. All others transferred over to the new system. Allison Richard had picked up a full Rx for alprazolam on 3/8, but then only got a partial RF on 4/8 because they did not have enough to fill entire month. There was a problem then with Allison Richard trying to p/up the rest of that RF when she needed the rest. Pharm did report that Allison Richard was able to get the last #20 yesterday. Called Allison Richard and confirmed she has what is needed, and she plans to come in to see Dr Merla Richesoolittle next Monday for her RFs.

## 2015-07-26 ENCOUNTER — Ambulatory Visit (INDEPENDENT_AMBULATORY_CARE_PROVIDER_SITE_OTHER): Admitting: Internal Medicine

## 2015-07-26 ENCOUNTER — Other Ambulatory Visit: Payer: Self-pay | Admitting: Internal Medicine

## 2015-07-26 VITALS — BP 118/80 | HR 79 | Temp 98.5°F | Resp 16 | Ht 65.0 in | Wt 137.0 lb

## 2015-07-26 DIAGNOSIS — R894 Abnormal immunological findings in specimens from other organs, systems and tissues: Secondary | ICD-10-CM | POA: Diagnosis not present

## 2015-07-26 DIAGNOSIS — G8929 Other chronic pain: Secondary | ICD-10-CM | POA: Diagnosis not present

## 2015-07-26 DIAGNOSIS — F418 Other specified anxiety disorders: Secondary | ICD-10-CM | POA: Diagnosis not present

## 2015-07-26 DIAGNOSIS — F1910 Other psychoactive substance abuse, uncomplicated: Secondary | ICD-10-CM

## 2015-07-26 DIAGNOSIS — M02371 Reiter's disease, right ankle and foot: Secondary | ICD-10-CM

## 2015-07-26 DIAGNOSIS — R768 Other specified abnormal immunological findings in serum: Secondary | ICD-10-CM

## 2015-07-26 DIAGNOSIS — M549 Dorsalgia, unspecified: Secondary | ICD-10-CM

## 2015-07-26 DIAGNOSIS — J4520 Mild intermittent asthma, uncomplicated: Secondary | ICD-10-CM | POA: Diagnosis not present

## 2015-07-26 LAB — POCT CBC
GRANULOCYTE PERCENT: 52.2 % (ref 37–80)
HCT, POC: 38.4 % (ref 37.7–47.9)
Hemoglobin: 13.7 g/dL (ref 12.2–16.2)
Lymph, poc: 2.4 (ref 0.6–3.4)
MCH: 29.9 pg (ref 27–31.2)
MCHC: 35.7 g/dL — AB (ref 31.8–35.4)
MCV: 83.8 fL (ref 80–97)
MID (CBC): 0.6 (ref 0–0.9)
MPV: 6.8 fL (ref 0–99.8)
POC GRANULOCYTE: 3.3 (ref 2–6.9)
POC LYMPH PERCENT: 37.6 %L (ref 10–50)
POC MID %: 10.2 % (ref 0–12)
Platelet Count, POC: 229 10*3/uL (ref 142–424)
RBC: 4.58 M/uL (ref 4.04–5.48)
RDW, POC: 12.8 %
WBC: 6.3 10*3/uL (ref 4.6–10.2)

## 2015-07-26 LAB — COMPREHENSIVE METABOLIC PANEL
ALBUMIN: 4.8 g/dL (ref 3.6–5.1)
ALT: 15 U/L (ref 6–29)
AST: 19 U/L (ref 10–30)
Alkaline Phosphatase: 53 U/L (ref 33–115)
BILIRUBIN TOTAL: 0.9 mg/dL (ref 0.2–1.2)
BUN: 9 mg/dL (ref 7–25)
CALCIUM: 9.6 mg/dL (ref 8.6–10.2)
CHLORIDE: 105 mmol/L (ref 98–110)
CO2: 26 mmol/L (ref 20–31)
Creat: 0.74 mg/dL (ref 0.50–1.10)
Glucose, Bld: 82 mg/dL (ref 65–99)
POTASSIUM: 4.4 mmol/L (ref 3.5–5.3)
Sodium: 140 mmol/L (ref 135–146)
Total Protein: 7.7 g/dL (ref 6.1–8.1)

## 2015-07-26 LAB — HEPATITIS C ANTIBODY: HCV AB: REACTIVE — AB

## 2015-07-26 LAB — POCT SEDIMENTATION RATE: POCT SED RATE: 14 mm/hr (ref 0–22)

## 2015-07-26 MED ORDER — ALBUTEROL SULFATE HFA 108 (90 BASE) MCG/ACT IN AERS: 2.0000 | INHALATION_SPRAY | Freq: Four times a day (QID) | RESPIRATORY_TRACT | Status: AC | PRN

## 2015-07-26 MED ORDER — ESCITALOPRAM OXALATE 20 MG PO TABS: 20.0000 mg | ORAL_TABLET | Freq: Every day | ORAL | Status: DC

## 2015-07-26 MED ORDER — CYCLOBENZAPRINE HCL 10 MG PO TABS: 10.0000 mg | ORAL_TABLET | Freq: Three times a day (TID) | ORAL | Status: DC | PRN

## 2015-07-26 MED ORDER — ALPRAZOLAM 2 MG PO TABS: 2.0000 mg | ORAL_TABLET | Freq: Three times a day (TID) | ORAL | Status: DC | PRN

## 2015-07-26 NOTE — Patient Instructions (Addendum)
google Good rx and enter albuterol---10$ at [facility] without insurance    IF you received an x-ray today, you will receive an invoice from [facility]. Please contact [facility] at [phone] with questions or concerns regarding your invoice.   IF you received labwork today, you will receive an invoice from [facility]. Please contact [facility] at [phone] with questions or concerns regarding your invoice.   Our billing staff will not be able to assist you with questions regarding bills from these companies.  You will be contacted with the lab results as soon as they are available. The fastest way to get your results is to activate your My Chart account. Instructions are located on the last page of this paperwork. If you have not heard from us regarding the results in 2 weeks, please contact this office.

## 2015-07-26 NOTE — Progress Notes (Addendum)
By signing my name below I, Shelah LewandowskyJoseph Thomas, attest that this documentation has been prepared under the direction and in the presence of Ellamae Siaobert Doolittle, MD. Electonically Signed. Shelah LewandowskyJoseph Thomas, Scribe 07/26/2015 at 4:08 PM   Subjective:    Patient ID: Allison Richard, female    DOB: 07/09/1985, 30 y.o.   MRN: [id number]  Chief Complaint  Patient presents with  . Medication Refill    HPI Allison Richard is a 30 y.o. female who presents to the [facility] requesting a medication refill.   Pt states she has been working at [employer] And doing well. Has been promoted to hostess  Pt also states that she is about to [facility] assessment [facility] so she can start taking the basic EMT course at [facility] [facility] 5/15.  Pt states she is doing well and feeling good. Her mood remains stable and she is refraining from other substance abuse.  Pt c/o new problem bilat ankle throbbing. Pt also c/o painful ROM in ankles bilat. Pt also reports bilat ankle pain upon bearing weight. Pt also reports mild left ankle swelling. Pt states that the ankle pain and swelling started a month ago, without a clear change in activity or footwear .  No other joints are involved. No skin rash. No known injury  Pt reports having severe reaction to the pollen this season and states she has been using her father's inhaler since she could not get her albuterol refilled due to not being able to afford it. Her insurance requires $60 payment for albuterol  Pt states she finished taking last course of antibiotics for her neck mass per Dr. Lazarus SalinesWolicki 2 months ago. The mass has resolved without clear etiology. She did not share needles with this person.  Pt denies any dysuria, urinary frequency. No vaginal discharge.  Pt states she has a friend from her past who has hep C and requested being tested for it. This friend currently graduated from [facility] a few weeks ago where she was found to have hep C and be  pregnant during her month long treatment which was successful.  Patient Active Problem List   Diagnosis Date Noted  . 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 Atypical chest pain 11/04/2014  . Seizures (HCC) 12/22/2013  . Anxiety state 12/22/2013  . Depression 12/22/2013  . Depression with anxiety 12/08/2011  . Acne 12/08/2011  . Insomnia 12/08/2011  . Lip laceration 11/07/2011  . Open wound of left upper arm 11/07/2011    Current outpatient prescriptions:  .  albuterol (PROVENTIL HFA;VENTOLIN HFA) 108 (90 Base) MCG/ACT inhaler, Inhale 2 puffs into the lungs every 6 (six) hours as needed for wheezing or shortness of breath., Disp: 1 Inhaler, Rfl: 5 .  alprazolam (XANAX) 2 MG tablet, Take 1 tablet (2 mg total) by mouth 3 (three) times daily as needed for sleep. 1 am, 1/2 midday, 1 hs, Disp: 90 tablet, Rfl: 1 .  cyclobenzaprine (FLEXERIL) 10 MG tablet, Take 1 tablet (10 mg total) by mouth 3 (three) times daily as needed for muscle spasms., Disp: 60 tablet, Rfl: 1 .  escitalopram (LEXAPRO) 20 MG tablet, Take 1 tablet (20 mg total) by mouth daily., Disp: 30 tablet, Rfl: 2 .  norgestimate-ethinyl estradiol (ORTHO-CYCLEN,SPRINTEC,PREVIFEM) 0.25-35 MG-MCG tablet, Take 1 tablet by mouth daily., Disp: 3 Package, Rfl: 0 .  QUEtiapine (SEROQUEL) 200 MG tablet, Take 1 tablet (200 mg total) by mouth at bedtime., Disp: 30 tablet, Rfl: 5  Allergies  Allergen Reactions  .  Tramadol Other (See Comments) and Rash    Seizures Seizures   . Vicodin [Hydrocodone-Acetaminophen] Itching, Nausea And Vomiting, Other (See Comments) and Rash    Reaction:  Hallucinations and bad dreams  Reaction:  Hallucinations and bad dreams      Review of Systems  Genitourinary: Negative for dysuria and frequency.  Musculoskeletal:       Positive for bilat ankle pain.  Psychiatric/Behavioral: Negative for behavioral problems and agitation.       Objective:   Physical Exam  Constitutional: She is oriented to person, place, and  time. She appears well-developed and well-nourished. No distress.  HENT:  Head: Normocephalic and atraumatic.  Eyes: Conjunctivae are normal. Pupils are equal, round, and reactive to light.  Neck: Neck supple.  Cardiovascular: Normal rate.   Pulmonary/Chest: Effort normal.  Musculoskeletal: Normal range of motion.  Rt ankle mild lateral swelling. Rt ankle is tender over the CF and ATF ligaments without redness. Rt ankle has mild discomfort with joint stressing. Left ankle has no swelling or redness. Left ankle has mild discomfort on ROM. Pt's left ankle also has mild lateral tenderness.  Neurological: She is alert and oriented to person, place, and time.  Skin: Skin is warm and dry.  Psychiatric: She has a normal mood and affect. Her behavior is normal.  Nursing note and vitals reviewed.    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 - Plan: POCT CBC, POCT SEDIMENTATION RATE,  Comprehensive metabolic panel, Hepatitis C antibody  Depression with anxiety--stable  Substance abuse--now stable  History of reactive airway disease/allergic asthma  Results for orders placed or performed in visit on 07/26/15  Comprehensive metabolic panel  Result Value Ref Range   Sodium 140 135 - 146 mmol/L   Potassium 4.4 3.5 - 5.3 mmol/L   Chloride 105 98 - 110 mmol/L   CO2 26 20 - 31 mmol/L   Glucose, Bld 82 65 - 99 mg/dL   BUN 9 7 - 25 mg/dL   Creat 1.61 0.96 - 0.45 mg/dL   Total Bilirubin 0.9 0.2 - 1.2 mg/dL   Alkaline Phosphatase 53 33 - 115 U/L   AST 19 10 - 30 U/L   ALT 15 6 - 29 U/L   Total Protein 7.7 6.1 - 8.1 g/dL   Albumin 4.8 3.6 - 5.1 g/dL   Calcium 9.6 8.6 - 40.9 mg/dL  Hepatitis C antibody  Result Value Ref Range   HCV Ab  Note  Hep C and HBSag and HIV neg 8/16 REACTIVE (A)  NEGATIVE  Hepatitis C RNA quantitative  Result Value Ref Range   HCV Quantitative  <15 IU/mL   HCV Quantitative Log  <1.18 log 10  POCT CBC  Result Value Ref Range   WBC 6.3 4.6 - 10.2 K/uL   Lymph, poc 2.4 0.6 - 3.4   POC LYMPH PERCENT 37.6 10 - 50 %L   MID (cbc) 0.6 0 - 0.9   POC MID % 10.2 0 - 12 %M   POC Granulocyte 3.3 2 - 6.9   Granulocyte percent 52.2 37 - 80 %G   RBC 4.58 4.04 - 5.48 M/uL   Hemoglobin 13.7 12.2 - 16.2 g/dL   HCT, POC 81.1 91.4 -  47.9 %   MCV 83.8 80 - 97 fL   MCH, POC 29.9 27 - 31.2 pg   MCHC 35.7 (A) 31.8 - 35.4 g/dL   RDW, POC 45.4 %   Platelet Count, POC 229 142 - 424 K/uL   MPV 6.8 0 - 99.8 fL  POCT SEDIMENTATION RATE  Result Value Ref Range   POCT SED RATE 14 0 - 22 mm/hr   She will need referral to infectious disease for hep c treatment--I have discussed these results with her We will add HIV and hepatitis B testing to the sample as well  Meds ordered this encounter  Medications  . albuterol (PROVENTIL HFA;VENTOLIN HFA) 108 (90 Base) MCG/ACT inhaler    Sig: Inhale 2 puffs into the lungs every 6 (six) hours as needed for wheezing or shortness of breath.    Dispense:  1 Inhaler    Refill:  5                                                                  [facility]  . escitalopram (LEXAPRO) 20 MG tablet    Sig: Take 1 tablet (20 mg total) by mouth daily.    Dispense:  30 tablet    Refill:  11  . cyclobenzaprine (FLEXERIL) 10 MG tablet    Sig: Take 1 tablet (10 mg total) by mouth 3 (three) times daily as needed for muscle spasms.    Dispense:  60 tablet    Refill:  5  . alprazolam (XANAX) 2 MG tablet    Sig: Take 1 tablet (2 mg total) by mouth 3 (three) times daily as needed for sleep. 1 am, 1/2 midday, 1 hs    Dispense:  90 tablet    Refill:  5    Ok to fill today   . predniSONE (DELTASONE) 20 MG tablet    Sig: 3/3/2/2/1/1 single daily dose for 6 days    Dispense:  12 tablet    Refill:  0  . meloxicam (MOBIC) 15 MG tablet     Sig: Take 1 tablet (15 mg total) by mouth daily.    Dispense:  30 tablet    Refill:  0  It is plausible that her reactive arthritis could be secondary to hepatitis C  She will follow-up here with Benny Lennert PA-C I have completed the patient encounter in its entirety as documented by the scribe, with editing by me where necessary. Robert P. Merla Riches, M.D.

## 2015-07-27 MED ORDER — MELOXICAM 15 MG PO TABS: 15.0000 mg | ORAL_TABLET | Freq: Every day | ORAL | Status: DC

## 2015-07-27 MED ORDER — PREDNISONE 20 MG PO TABS
ORAL_TABLET | ORAL | Status: DC
Start: 2015-07-27 — End: 2015-08-17

## 2015-07-28 LAB — HEPATITIS C RNA QUANTITATIVE
HCV QUANT: 15777 [IU]/mL — AB (ref <15)
HCV Quantitative Log: 4.2 {Log} — ABNORMAL HIGH (ref <1.18)

## 2015-07-28 LAB — HEPATITIS B SURFACE ANTIGEN: Hepatitis B Surface Ag: NEGATIVE

## 2015-07-28 LAB — HIV ANTIBODY (ROUTINE TESTING W REFLEX): HIV: NONREACTIVE

## 2015-08-14 ENCOUNTER — Other Ambulatory Visit: Payer: Self-pay | Admitting: *Deleted

## 2015-08-14 ENCOUNTER — Telehealth: Payer: Self-pay | Admitting: *Deleted

## 2015-08-14 DIAGNOSIS — G4700 Insomnia, unspecified: Secondary | ICD-10-CM

## 2015-08-14 DIAGNOSIS — F418 Other specified anxiety disorders: Secondary | ICD-10-CM

## 2015-08-14 NOTE — Telephone Encounter (Signed)
Patient states she is running out of xanax been taking it 4 x daily.  Wants enough to make it till she can refill in 9 days.  Spoke with Kathlene NovemberMike he advised patient should come in to be seen, since Dr. Merla Richesoolittle will not be her till 5/30.  Patient understood and is going to come in Monday.

## 2015-08-17 ENCOUNTER — Ambulatory Visit (INDEPENDENT_AMBULATORY_CARE_PROVIDER_SITE_OTHER): Admitting: Family Medicine

## 2015-08-17 VITALS — BP 116/84 | HR 120 | Temp 98.1°F | Resp 18 | Ht 65.0 in | Wt 134.0 lb

## 2015-08-17 DIAGNOSIS — B182 Chronic viral hepatitis C: Secondary | ICD-10-CM | POA: Diagnosis not present

## 2015-08-17 DIAGNOSIS — F418 Other specified anxiety disorders: Secondary | ICD-10-CM | POA: Diagnosis not present

## 2015-08-17 MED ORDER — ALPRAZOLAM 2 MG PO TABS: 2.0000 mg | ORAL_TABLET | Freq: Three times a day (TID) | ORAL | Status: DC | PRN

## 2015-08-17 NOTE — Patient Instructions (Addendum)
  Do not use your xanax more than 3 times a day max.   IF you received an x-ray today, you will receive an invoice from [facility]. Please contact [facility] at [phone] with questions or concerns regarding your invoice.   IF you received labwork today, you will receive an invoice from [facility]. Please contact [facility] at [phone] with questions or concerns regarding your invoice.   Our billing staff will not be able to assist you with questions regarding bills from these companies.  You will be contacted with the lab results as soon as they are available. The fastest way to get your results is to activate your My Chart account. Instructions are located on the last page of this paperwork. If you have not heard from us regarding the results in 2 weeks, please contact this office.     Call: [facility] [address], [place], [place] [address]  Phone: [phone] About getting the appt for the hep C.  They have your referral from us.   Also, recommend trying to get an appt with Dr. Evelene CroonKaur at: [facility]  [address], [place], [place] [address]  [phone]  [facility] Policy for Prescribing Controlled Substances (Revised 01/2012) 1. Prescriptions for controlled substances will be filled by ONE provider at [facility] with whom you have established and developed a plan for your care, including follow-up. 2. You are encouraged to schedule an appointment with your prescriber at our appointment center for follow-up visits whenever possible. 3. If you request a prescription for the controlled substance while at [facility] for an acute problem (with someone other than your regular prescriber), you MAY be given a ONE-TIME prescription for a 30-day supply of the controlled substance, to allow time for you to return to see your regular prescriber for additional prescriptions.
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
## 2015-09-15 ENCOUNTER — Inpatient Hospital Stay (HOSPITAL_COMMUNITY)

## 2015-09-15 ENCOUNTER — Encounter (HOSPITAL_COMMUNITY): Payer: Self-pay

## 2015-09-15 ENCOUNTER — Inpatient Hospital Stay (HOSPITAL_COMMUNITY)
Admission: AD | Admit: 2015-09-15 | Discharge: 2015-09-16 | Disposition: A | Discharge status: Home / Self Care | Source: Ambulatory Visit | Attending: Obstetrics and Gynecology | Admitting: Obstetrics and Gynecology

## 2015-09-15 DIAGNOSIS — R102 Pelvic and perineal pain: Secondary | ICD-10-CM | POA: Diagnosis not present

## 2015-09-15 DIAGNOSIS — F319 Bipolar disorder, unspecified: Secondary | ICD-10-CM | POA: Diagnosis not present

## 2015-09-15 DIAGNOSIS — Z885 Allergy status to narcotic agent status: Secondary | ICD-10-CM | POA: Insufficient documentation

## 2015-09-15 DIAGNOSIS — O99341 Other mental disorders complicating pregnancy, first trimester: Secondary | ICD-10-CM | POA: Insufficient documentation

## 2015-09-15 DIAGNOSIS — O26891 Other specified pregnancy related conditions, first trimester: Secondary | ICD-10-CM | POA: Insufficient documentation

## 2015-09-15 DIAGNOSIS — O3680X0 Pregnancy with inconclusive fetal viability, not applicable or unspecified: Secondary | ICD-10-CM

## 2015-09-15 DIAGNOSIS — Z3A00 Weeks of gestation of pregnancy not specified: Secondary | ICD-10-CM | POA: Insufficient documentation

## 2015-09-15 DIAGNOSIS — F419 Anxiety disorder, unspecified: Secondary | ICD-10-CM | POA: Diagnosis not present

## 2015-09-15 DIAGNOSIS — Z79899 Other long term (current) drug therapy: Secondary | ICD-10-CM | POA: Diagnosis not present

## 2015-09-15 LAB — URINALYSIS, ROUTINE W REFLEX MICROSCOPIC
Bilirubin Urine: NEGATIVE
Glucose, UA: NEGATIVE mg/dL
HGB URINE DIPSTICK: NEGATIVE
Ketones, ur: NEGATIVE mg/dL
LEUKOCYTES UA: NEGATIVE
NITRITE: NEGATIVE
PROTEIN: NEGATIVE mg/dL
Specific Gravity, Urine: 1.025 (ref 1.005–1.030)
pH: 6 (ref 5.0–8.0)

## 2015-09-15 LAB — POCT PREGNANCY, URINE: PREG TEST UR: POSITIVE — AB

## 2015-09-15 LAB — CBC
HCT: 37.5 % (ref 36.0–46.0)
HEMOGLOBIN: 13 g/dL (ref 12.0–15.0)
MCH: 29.3 pg (ref 26.0–34.0)
MCHC: 34.7 g/dL (ref 30.0–36.0)
MCV: 84.7 fL (ref 78.0–100.0)
PLATELETS: 228 10*3/uL (ref 150–400)
RBC: 4.43 MIL/uL (ref 3.87–5.11)
RDW: 13.1 % (ref 11.5–15.5)
WBC: 6.8 10*3/uL (ref 4.0–10.5)

## 2015-09-15 LAB — WET PREP, GENITAL
CLUE CELLS WET PREP: NONE SEEN
SPERM: NONE SEEN
Trich, Wet Prep: NONE SEEN
Yeast Wet Prep HPF POC: NONE SEEN

## 2015-09-15 LAB — HCG, QUANTITATIVE, PREGNANCY: HCG, BETA CHAIN, QUANT, S: 141 m[IU]/mL — AB (ref <5)

## 2015-09-15 NOTE — Discharge Instructions (Signed)
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
## 2015-09-15 NOTE — MAU Note (Signed)
Had +upt at home. Having a sharp pain down left side and some lower abdominal cramping that started this morning. Denies vag bleeding or discharge. Denies urinary s/s. LMP: 07/26/2015.

## 2015-09-16 LAB — HIV ANTIBODY (ROUTINE TESTING W REFLEX): HIV SCREEN 4TH GENERATION: NONREACTIVE

## 2015-09-16 LAB — RPR: RPR: NONREACTIVE

## 2015-09-16 LAB — GC/CHLAMYDIA PROBE AMP (~~LOC~~) NOT AT ARMC
CHLAMYDIA, DNA PROBE: NEGATIVE
NEISSERIA GONORRHEA: NEGATIVE

## 2015-09-18 ENCOUNTER — Inpatient Hospital Stay (HOSPITAL_COMMUNITY)
Admit: 2015-09-18 | Discharge: 2015-09-19 | Disposition: A | Discharge status: Home / Self Care | Source: Ambulatory Visit | Attending: Obstetrics and Gynecology | Admitting: Obstetrics and Gynecology

## 2015-09-18 ENCOUNTER — Inpatient Hospital Stay (HOSPITAL_COMMUNITY)

## 2015-09-18 ENCOUNTER — Encounter (HOSPITAL_COMMUNITY): Payer: Self-pay | Admitting: *Deleted

## 2015-09-18 DIAGNOSIS — F319 Bipolar disorder, unspecified: Secondary | ICD-10-CM | POA: Diagnosis not present

## 2015-09-18 DIAGNOSIS — Z3A01 Less than 8 weeks gestation of pregnancy: Secondary | ICD-10-CM | POA: Diagnosis not present

## 2015-09-18 DIAGNOSIS — O99321 Drug use complicating pregnancy, first trimester: Secondary | ICD-10-CM | POA: Insufficient documentation

## 2015-09-18 DIAGNOSIS — O99341 Other mental disorders complicating pregnancy, first trimester: Secondary | ICD-10-CM | POA: Insufficient documentation

## 2015-09-18 DIAGNOSIS — F419 Anxiety disorder, unspecified: Secondary | ICD-10-CM | POA: Diagnosis not present

## 2015-09-18 DIAGNOSIS — R102 Pelvic and perineal pain: Secondary | ICD-10-CM | POA: Insufficient documentation

## 2015-09-18 DIAGNOSIS — O26891 Other specified pregnancy related conditions, first trimester: Secondary | ICD-10-CM | POA: Insufficient documentation

## 2015-09-18 LAB — URINALYSIS, ROUTINE W REFLEX MICROSCOPIC
Bilirubin Urine: NEGATIVE
Glucose, UA: NEGATIVE mg/dL
Hgb urine dipstick: NEGATIVE
KETONES UR: NEGATIVE mg/dL
LEUKOCYTES UA: NEGATIVE
NITRITE: NEGATIVE
PROTEIN: NEGATIVE mg/dL
Specific Gravity, Urine: 1.025 (ref 1.005–1.030)
pH: 5.5 (ref 5.0–8.0)

## 2015-09-18 LAB — TYPE AND SCREEN
ABO/RH(D): O POS
ANTIBODY SCREEN: NEGATIVE

## 2015-09-18 LAB — HCG, QUANTITATIVE, PREGNANCY: hCG, Beta Chain, Quant, S: 789 m[IU]/mL — ABNORMAL HIGH (ref <5)

## 2015-09-18 MED ORDER — OXYCODONE-ACETAMINOPHEN 5-325 MG PO TABS
1.0000 | ORAL_TABLET | Freq: Once | ORAL | Status: AC
  Administered 2015-09-19: 1 via ORAL
  Filled 2015-09-18: qty 1

## 2015-09-18 NOTE — MAU Note (Signed)
Having pain lower abd that is crampy. Was suppose to have blood work Fri and did not realize they closed at lunch so did not have blood drawn. No bleeding. Had some cramping last night that Tylenol helped some
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
## 2015-09-19 DIAGNOSIS — R102 Pelvic and perineal pain: Secondary | ICD-10-CM

## 2015-09-19 DIAGNOSIS — O26891 Other specified pregnancy related conditions, first trimester: Secondary | ICD-10-CM | POA: Diagnosis not present

## 2015-09-19 LAB — CBC
HEMATOCRIT: 36.7 % (ref 36.0–46.0)
HEMOGLOBIN: 12.4 g/dL (ref 12.0–15.0)
MCH: 28.7 pg (ref 26.0–34.0)
MCHC: 33.8 g/dL (ref 30.0–36.0)
MCV: 85 fL (ref 78.0–100.0)
PLATELETS: 250 10*3/uL (ref 150–400)
RBC: 4.32 MIL/uL (ref 3.87–5.11)
RDW: 13.1 % (ref 11.5–15.5)
WBC: 6.5 10*3/uL (ref 4.0–10.5)

## 2015-09-19 NOTE — Discharge Instructions (Signed)

## 2015-09-23 ENCOUNTER — Inpatient Hospital Stay (HOSPITAL_COMMUNITY)

## 2015-09-23 ENCOUNTER — Inpatient Hospital Stay (HOSPITAL_COMMUNITY)
Admission: AD | Admit: 2015-09-23 | Discharge: 2015-09-23 | Disposition: A | Discharge status: Home / Self Care | Source: Ambulatory Visit | Attending: Obstetrics and Gynecology | Admitting: Obstetrics and Gynecology

## 2015-09-23 ENCOUNTER — Encounter (HOSPITAL_COMMUNITY): Payer: Self-pay | Admitting: *Deleted

## 2015-09-23 DIAGNOSIS — R1032 Left lower quadrant pain: Secondary | ICD-10-CM | POA: Diagnosis not present

## 2015-09-23 DIAGNOSIS — Z3A08 8 weeks gestation of pregnancy: Secondary | ICD-10-CM | POA: Diagnosis not present

## 2015-09-23 DIAGNOSIS — F319 Bipolar disorder, unspecified: Secondary | ICD-10-CM | POA: Diagnosis not present

## 2015-09-23 DIAGNOSIS — O99341 Other mental disorders complicating pregnancy, first trimester: Secondary | ICD-10-CM | POA: Diagnosis not present

## 2015-09-23 DIAGNOSIS — F419 Anxiety disorder, unspecified: Secondary | ICD-10-CM | POA: Insufficient documentation

## 2015-09-23 DIAGNOSIS — O9989 Other specified diseases and conditions complicating pregnancy, childbirth and the puerperium: Secondary | ICD-10-CM

## 2015-09-23 DIAGNOSIS — O26891 Other specified pregnancy related conditions, first trimester: Secondary | ICD-10-CM | POA: Insufficient documentation

## 2015-09-23 DIAGNOSIS — O3680X0 Pregnancy with inconclusive fetal viability, not applicable or unspecified: Secondary | ICD-10-CM

## 2015-09-23 HISTORY — DX: Unspecified viral hepatitis C without hepatic coma: B19.20

## 2015-09-23 HISTORY — DX: Herpesviral infection, unspecified: B00.9

## 2015-09-23 LAB — URINALYSIS, ROUTINE W REFLEX MICROSCOPIC
BILIRUBIN URINE: NEGATIVE
GLUCOSE, UA: NEGATIVE mg/dL
HGB URINE DIPSTICK: NEGATIVE
Ketones, ur: NEGATIVE mg/dL
Leukocytes, UA: NEGATIVE
Nitrite: NEGATIVE
PROTEIN: NEGATIVE mg/dL
SPECIFIC GRAVITY, URINE: 1.02 (ref 1.005–1.030)
pH: 7 (ref 5.0–8.0)

## 2015-09-23 LAB — HCG, QUANTITATIVE, PREGNANCY: HCG, BETA CHAIN, QUANT, S: 7962 m[IU]/mL — AB (ref <5)

## 2015-09-23 MED ORDER — ACETAMINOPHEN 325 MG PO TABS
650.0000 mg | ORAL_TABLET | Freq: Once | ORAL | Status: AC
  Administered 2015-09-23: 650 mg via ORAL
  Filled 2015-09-23: qty 2

## 2015-09-23 NOTE — Discharge Instructions (Signed)

## 2015-09-23 NOTE — MAU Note (Signed)
Pt reports she has had increased left side abd pain. Stated she has had a fever of 100.2  For the past 2 nights.
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
## 2015-09-26 ENCOUNTER — Inpatient Hospital Stay (HOSPITAL_COMMUNITY)
Admission: AD | Admit: 2015-09-26 | Discharge: 2015-09-26 | Disposition: A | Discharge status: Home / Self Care | Source: Ambulatory Visit | Attending: Obstetrics and Gynecology | Admitting: Obstetrics and Gynecology

## 2015-09-26 DIAGNOSIS — Z3A14 14 weeks gestation of pregnancy: Secondary | ICD-10-CM | POA: Diagnosis not present

## 2015-09-26 DIAGNOSIS — R109 Unspecified abdominal pain: Secondary | ICD-10-CM | POA: Diagnosis not present

## 2015-09-26 DIAGNOSIS — O9989 Other specified diseases and conditions complicating pregnancy, childbirth and the puerperium: Secondary | ICD-10-CM

## 2015-09-26 DIAGNOSIS — O3680X0 Pregnancy with inconclusive fetal viability, not applicable or unspecified: Secondary | ICD-10-CM | POA: Insufficient documentation

## 2015-09-26 DIAGNOSIS — O26892 Other specified pregnancy related conditions, second trimester: Secondary | ICD-10-CM | POA: Insufficient documentation

## 2015-09-26 DIAGNOSIS — O26899 Other specified pregnancy related conditions, unspecified trimester: Secondary | ICD-10-CM

## 2015-09-26 LAB — HCG, QUANTITATIVE, PREGNANCY: HCG, BETA CHAIN, QUANT, S: 19822 m[IU]/mL — AB (ref <5)

## 2015-09-26 NOTE — Discharge Instructions (Signed)

## 2015-09-26 NOTE — MAU Note (Signed)
Doing ok, little achy and tired.

## 2015-09-26 NOTE — MAU Provider Note (Signed)
History    First Provider Initiated Contact with Patient 09/26/15 1617      Chief Complaint:  Follow-up   Allison Richard is  30 y.o. G3P1010 Patient's last menstrual period was 07/26/2015.Marland Kitchen. Patient is here for follow up of quantitative HCG and ongoing surveillance of pregnancy status.   She is 1333w6d weeks gestation  by LMP.    Since her last visit, the patient is without new complaint.     ROS Abdomin Pain: MIld Vaginal bleeding: none now.   Passage of clots or tissue: None Dizziness: None  Her previous Quantitative HCG values are:  Results for Allison MinisterRUIZ-TYSON, Allison Richard (MRN [id number]) as of 09/28/2015 16:08  Ref. Range 09/23/2015 17:08  HCG, Beta Chain, Quant, S Latest Ref Range: <5 mIU/mL 7962 (H)    Physical Exam   BP 108/68 mmHg  Pulse 92  Temp(Src) 98.2 F (36.8 C) (Oral)  Resp 16  LMP 07/26/2015 Constitutional: Well-nourished female in no apparent distress. No pallor Neuro: Alert and oriented 4 Cardiovascular: Normal rate Respiratory: Normal effort and rate Abdomen: Soft, nontender Gynecological Exam: examination not indicated  Labs: Results for orders placed or performed during the hospital encounter of 09/26/15 (from the past 24 hour(s))  hCG, quantitative, pregnancy   Collection Time: 09/26/15  2:27 PM  Result Value Ref Range   hCG, Beta Chain, Quant, S 19822 (H) <5 mIU/mL    Ultrasound Studies:   [place] Ob Comp Less 14 Wks  09/15/2015  CLINICAL DATA:  Acute onset of left lower quadrant abdominal pain and cramping. Initial encounter. EXAM: OBSTETRIC <14 WK US AND TRANSVAGINAL OB US TECHNIQUE: Both transabdominal and transvaginal ultrasound examinations were performed for complete evaluation of the gestation as well as the maternal uterus, adnexal regions, and pelvic cul-de-sac. Transvaginal technique was performed to assess early pregnancy. COMPARISON:  Pelvic ultrasound performed 01/02/2015 FINDINGS: Intrauterine gestational sac: None seen. Yolk sac:  N/A  Embryo:  N/A Subchorionic hemorrhage:  None visualized. Maternal uterus/adnexae: The uterus is unremarkable in appearance. The ovaries are within normal limits. The right ovary measures 3.7 x 2.2 x 2.6 cm, while the left ovary measures 2.9 x 1.5 x 1.5 cm. No suspicious adnexal masses are seen; there is no evidence for ovarian torsion. Trace free fluid is seen within the pelvic cul-de-sac. IMPRESSION: No intrauterine gestational sac seen. No evidence for ectopic pregnancy. This remains within normal limits, given the quantitative beta HCG level of 141. If the quantitative beta HCG level continues to trend upward, follow-up pelvic ultrasound could be considered in 2 weeks. Electronically Signed   By: Roanna RaiderJeffery  Chang M.D.   On: 09/15/2015 23:18   [place] Ob Transvaginal  09/23/2015  CLINICAL DATA:  Left lower quadrant pain and fever for 2 days. EXAM: TRANSVAGINAL OB ULTRASOUND TECHNIQUE: Transvaginal ultrasound was performed for complete evaluation of the gestation as well as the maternal uterus, adnexal regions, and pelvic cul-de-sac. COMPARISON:  Pelvic ultrasound dated 09/15/2015 and 09/18/2015. FINDINGS: Intrauterine gestational sac: Present. Yolk sac:  Not present. Embryo:  Not present. MSD: 0.64 cm  mm   5 w   2  d Subchorionic hemorrhage:  Small. Maternal uterus/adnexae: Within normal limits. No evidence of free pelvic fluid. IMPRESSION: Single intrauterine gestational sac, which corresponds to 5 weeks and 2 days gestation. No yolk sac or fetal pole seen. Small subchorionic hemorrhage noted. This may represent an early normal intrauterine pregnancy corresponding to 5 weeks 2 days gestation. However patient's expected gestational age is 8 weeks and 3 days, and therefore these findings  may represent an abnormally progressing pregnancy. Probable early intrauterine gestational sac, but no yolk sac, fetal pole, or cardiac activity yet visualized. Recommend follow-up quantitative B-HCG levels and follow-up US in 14 days  to confirm and assess viability. This recommendation follows SRU consensus guidelines: Diagnostic Criteria for Nonviable Pregnancy Early in the First Trimester. Malva Limes Engl J Med 2013; 147:8295-62; 369:1443-51. Electronically Signed   By: Ted Mcalpineobrinka  Dimitrova M.D.   On: 09/23/2015 19:44   Results for orders placed or performed during the hospital encounter of 09/26/15 (from the past 168 hour(s))  hCG, quantitative, pregnancy   Collection Time: 09/26/15  2:27 PM  Result Value Ref Range   hCG, Beta Chain, Quant, S 19822 (H) <5 mIU/mL    MAU course/MDM: Quantitative hCG ordered  Abd pain in early pregnancy with normal rise in Quant and hemodynamically stable.  Assessment: Early pregnancy w/ appropriate rise in Quant Pregnancy of unknown anatomic location  Plan: Discharge home in stable condition per consult with Dr. Marcelle OverlieHolland. SAB and ectopic precautions Follow-up Information    [place] with [facility].   Why:  Call to schedule a follow-up untrasound 1 week after last one (09/23/15)   Contact information:   [address] [place] [address] [phone]       Follow up with [facility] ADMISS[facility].   Why:  As needed in emergencies   Contact information:   [address] [id number] mc [place] [id number] [phone]       Medication List    STOP taking these medications        alprazolam 2 MG tablet  Commonly known as:  XANAX      TAKE these medications        acetaminophen 500 MG tablet  Commonly known as:  TYLENOL  Take 500 mg by mouth every 6 (six) hours as needed for fever or headache.     albuterol 108 (90 Base) MCG/ACT inhaler  Commonly known as:  PROVENTIL HFA;VENTOLIN HFA  Inhale 2 puffs into the lungs every 6 (six) hours as needed for wheezing or shortness of breath.     cyclobenzaprine 10 MG tablet  Commonly known as:  FLEXERIL  Take 1 tablet (10 mg total) by mouth 3 (three)  times daily as needed for muscle spasms.     escitalopram 20 MG tablet  Commonly known as:  LEXAPRO  Take 1 tablet (20 mg total) by mouth daily.     prenatal multivitamin Tabs tablet  Take 1 tablet by mouth daily at 12 noon.     QUEtiapine 200 MG tablet  Commonly known as:  SEROQUEL  Take 1 tablet (200 mg total) by mouth at bedtime.        Dorathy KinsmanVirginia Aveion Nguyen, CNM 09/26/2015, 4:19 PM  2/3

## 2015-09-29 ENCOUNTER — Other Ambulatory Visit

## 2015-10-01 ENCOUNTER — Encounter (HOSPITAL_COMMUNITY): Payer: Self-pay | Admitting: *Deleted

## 2015-10-01 ENCOUNTER — Inpatient Hospital Stay (HOSPITAL_COMMUNITY)
Admission: AD | Admit: 2015-10-01 | Discharge: 2015-10-02 | Disposition: A | Discharge status: Home / Self Care | Source: Ambulatory Visit | Attending: Obstetrics and Gynecology | Admitting: Obstetrics and Gynecology

## 2015-10-01 DIAGNOSIS — O218 Other vomiting complicating pregnancy: Secondary | ICD-10-CM | POA: Diagnosis not present

## 2015-10-01 DIAGNOSIS — R109 Unspecified abdominal pain: Secondary | ICD-10-CM

## 2015-10-01 DIAGNOSIS — Z3A01 Less than 8 weeks gestation of pregnancy: Secondary | ICD-10-CM | POA: Diagnosis not present

## 2015-10-01 DIAGNOSIS — R110 Nausea: Secondary | ICD-10-CM

## 2015-10-01 DIAGNOSIS — K5900 Constipation, unspecified: Secondary | ICD-10-CM | POA: Diagnosis not present

## 2015-10-01 DIAGNOSIS — O26891 Other specified pregnancy related conditions, first trimester: Secondary | ICD-10-CM | POA: Insufficient documentation

## 2015-10-01 DIAGNOSIS — O26899 Other specified pregnancy related conditions, unspecified trimester: Secondary | ICD-10-CM

## 2015-10-01 DIAGNOSIS — Z3491 Encounter for supervision of normal pregnancy, unspecified, first trimester: Secondary | ICD-10-CM

## 2015-10-01 DIAGNOSIS — O9989 Other specified diseases and conditions complicating pregnancy, childbirth and the puerperium: Secondary | ICD-10-CM | POA: Diagnosis not present

## 2015-10-01 DIAGNOSIS — Z9889 Other specified postprocedural states: Secondary | ICD-10-CM | POA: Diagnosis not present

## 2015-10-01 LAB — CBC
HEMATOCRIT: 36.5 % (ref 36.0–46.0)
Hemoglobin: 12.7 g/dL (ref 12.0–15.0)
MCH: 29.1 pg (ref 26.0–34.0)
MCHC: 34.8 g/dL (ref 30.0–36.0)
MCV: 83.5 fL (ref 78.0–100.0)
Platelets: 206 10*3/uL (ref 150–400)
RBC: 4.37 MIL/uL (ref 3.87–5.11)
RDW: 12.9 % (ref 11.5–15.5)
WBC: 7 10*3/uL (ref 4.0–10.5)

## 2015-10-01 NOTE — MAU Note (Signed)
Having stabbing pain LLQ. Woke this morning dizzy with headache. Whole abdomen feels like is knotting. Feel stabbing pain LLQ. Pain is making me throw up.

## 2015-10-02 ENCOUNTER — Encounter (HOSPITAL_COMMUNITY): Payer: Self-pay | Admitting: Student

## 2015-10-02 ENCOUNTER — Inpatient Hospital Stay (HOSPITAL_COMMUNITY)

## 2015-10-02 DIAGNOSIS — O218 Other vomiting complicating pregnancy: Secondary | ICD-10-CM

## 2015-10-02 DIAGNOSIS — R109 Unspecified abdominal pain: Secondary | ICD-10-CM | POA: Diagnosis not present

## 2015-10-02 DIAGNOSIS — O9989 Other specified diseases and conditions complicating pregnancy, childbirth and the puerperium: Secondary | ICD-10-CM

## 2015-10-02 LAB — URINALYSIS, ROUTINE W REFLEX MICROSCOPIC
BILIRUBIN URINE: NEGATIVE
Glucose, UA: NEGATIVE mg/dL
Hgb urine dipstick: NEGATIVE
KETONES UR: NEGATIVE mg/dL
LEUKOCYTES UA: NEGATIVE
NITRITE: NEGATIVE
PROTEIN: NEGATIVE mg/dL
Specific Gravity, Urine: 1.025 (ref 1.005–1.030)
pH: 6 (ref 5.0–8.0)

## 2015-10-02 LAB — HCG, QUANTITATIVE, PREGNANCY: HCG, BETA CHAIN, QUANT, S: 52906 m[IU]/mL — AB (ref <5)

## 2015-10-02 MED ORDER — PROMETHAZINE HCL 25 MG PO TABS
25.0000 mg | ORAL_TABLET | Freq: Once | ORAL | Status: AC
  Administered 2015-10-02: 25 mg via ORAL
  Filled 2015-10-02: qty 1

## 2015-10-02 MED ORDER — PROMETHAZINE HCL 12.5 MG PO TABS: 12.5000 mg | ORAL_TABLET | Freq: Four times a day (QID) | ORAL | Status: DC | PRN
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
## 2015-10-02 NOTE — Discharge Instructions (Signed)

## 2015-10-19 LAB — OB RESULTS CONSOLE ABO/RH: RH Type: POSITIVE

## 2015-10-19 LAB — OB RESULTS CONSOLE GC/CHLAMYDIA
CHLAMYDIA, DNA PROBE: NEGATIVE
GC PROBE AMP, GENITAL: NEGATIVE

## 2015-10-19 LAB — OB RESULTS CONSOLE HIV ANTIBODY (ROUTINE TESTING): HIV: NONREACTIVE

## 2015-10-19 LAB — OB RESULTS CONSOLE HEPATITIS B SURFACE ANTIGEN: Hepatitis B Surface Ag: NEGATIVE

## 2015-10-19 LAB — OB RESULTS CONSOLE ANTIBODY SCREEN: Antibody Screen: NEGATIVE

## 2015-10-19 LAB — OB RESULTS CONSOLE RPR: RPR: NONREACTIVE

## 2015-10-19 LAB — OB RESULTS CONSOLE RUBELLA ANTIBODY, IGM: RUBELLA: IMMUNE

## 2015-10-31 ENCOUNTER — Emergency Department (HOSPITAL_COMMUNITY)
Admission: EM | Admit: 2015-10-31 | Discharge: 2015-10-31 | Disposition: A | Discharge status: Home / Self Care | Attending: Emergency Medicine | Admitting: Emergency Medicine

## 2015-10-31 ENCOUNTER — Ambulatory Visit (HOSPITAL_COMMUNITY): Admission: EM | Admit: 2015-10-31 | Discharge: 2015-10-31 | Disposition: A | Discharge status: Home / Self Care

## 2015-10-31 DIAGNOSIS — F15280 Other stimulant dependence with stimulant-induced anxiety disorder: Secondary | ICD-10-CM | POA: Diagnosis not present

## 2015-10-31 DIAGNOSIS — Z79899 Other long term (current) drug therapy: Secondary | ICD-10-CM

## 2015-10-31 DIAGNOSIS — F1320 Sedative, hypnotic or anxiolytic dependence, uncomplicated: Secondary | ICD-10-CM

## 2015-10-31 DIAGNOSIS — Z3A10 10 weeks gestation of pregnancy: Secondary | ICD-10-CM | POA: Insufficient documentation

## 2015-10-31 DIAGNOSIS — O99341 Other mental disorders complicating pregnancy, first trimester: Secondary | ICD-10-CM | POA: Insufficient documentation

## 2015-10-31 DIAGNOSIS — Z760 Encounter for issue of repeat prescription: Secondary | ICD-10-CM | POA: Diagnosis present

## 2015-10-31 DIAGNOSIS — F419 Anxiety disorder, unspecified: Secondary | ICD-10-CM

## 2015-10-31 MED ORDER — ALPRAZOLAM 1 MG PO TABS: 1.0000 mg | ORAL_TABLET | Freq: Three times a day (TID) | ORAL | 0 refills | Status: DC | PRN

## 2015-10-31 MED ORDER — CHLORDIAZEPOXIDE HCL 5 MG PO CAPS: ORAL_CAPSULE | ORAL | 0 refills | Status: DC
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
## 2015-10-31 NOTE — Discharge Instructions (Signed)
Read the information below.  Use the prescribed medication as directed.  Please discuss all new medications with your pharmacist.  You may return to the Emergency Department at any time for worsening condition or any new symptoms that concern you.  As you know, benzodiazepines are not recommended in pregnancy and not considered safe for the baby in pregnancy.  It is very important that you wean yourself off of the benzodiazepines and take as few as you possibly need while weaning off.  [facility] or call Dr Langston MaskerMorris' office with any questions or worsening symptoms.

## 2015-10-31 NOTE — ED Triage Notes (Signed)
Pt states that she is [redacted] weeks pregnant and has been taking xanax for 10 years but has run out of her prescription and also wants to know if it is safe to take the Xanax in pregnancy. Alert and oriented. Unable to get in with her PCP.

## 2015-11-05 ENCOUNTER — Encounter (HOSPITAL_COMMUNITY): Payer: Self-pay

## 2015-11-05 ENCOUNTER — Ambulatory Visit (HOSPITAL_COMMUNITY): Attending: Obstetrics & Gynecology

## 2015-11-29 ENCOUNTER — Encounter (HOSPITAL_COMMUNITY): Payer: Self-pay | Admitting: *Deleted

## 2015-11-29 ENCOUNTER — Other Ambulatory Visit: Payer: Self-pay

## 2015-11-29 ENCOUNTER — Inpatient Hospital Stay (HOSPITAL_COMMUNITY)
Admission: AD | Admit: 2015-11-29 | Discharge: 2015-11-29 | Disposition: A | Discharge status: Home / Self Care | Source: Ambulatory Visit | Attending: Obstetrics and Gynecology | Admitting: Obstetrics and Gynecology

## 2015-11-29 DIAGNOSIS — Z3A14 14 weeks gestation of pregnancy: Secondary | ICD-10-CM | POA: Diagnosis not present

## 2015-11-29 DIAGNOSIS — F419 Anxiety disorder, unspecified: Secondary | ICD-10-CM | POA: Insufficient documentation

## 2015-11-29 DIAGNOSIS — F411 Generalized anxiety disorder: Secondary | ICD-10-CM | POA: Diagnosis not present

## 2015-11-29 DIAGNOSIS — Z87891 Personal history of nicotine dependence: Secondary | ICD-10-CM | POA: Insufficient documentation

## 2015-11-29 DIAGNOSIS — O99342 Other mental disorders complicating pregnancy, second trimester: Secondary | ICD-10-CM | POA: Insufficient documentation

## 2015-11-29 LAB — URINALYSIS, ROUTINE W REFLEX MICROSCOPIC
Bilirubin Urine: NEGATIVE
Glucose, UA: NEGATIVE mg/dL
HGB URINE DIPSTICK: NEGATIVE
Ketones, ur: 15 mg/dL — AB
NITRITE: NEGATIVE
PROTEIN: NEGATIVE mg/dL
SPECIFIC GRAVITY, URINE: 1.02 (ref 1.005–1.030)
pH: 6 (ref 5.0–8.0)

## 2015-11-29 LAB — URINE MICROSCOPIC-ADD ON
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
## 2015-11-29 NOTE — Discharge Instructions (Signed)
Panic Attacks °Panic attacks are sudden, short feelings of great fear or discomfort. You may have them for no reason when you are relaxed, when you are uneasy (anxious), or when you are sleeping.  °HOME CARE °· Take all your medicines as told. °· Check with your doctor before starting new medicines. °· Keep all doctor visits. °GET HELP IF: °· You are not able to take your medicines as told. °· Your symptoms do not get better. °· Your symptoms get worse. °GET HELP RIGHT AWAY IF: °· Your attacks seem different than your normal attacks. °· You have thoughts about hurting yourself or others. °· You take panic attack medicine and you have a side effect. °MAKE SURE YOU: °· Understand these instructions. °· Will watch your condition. °· Will get help right away if you are not doing well or get worse. °  °This information is not intended to replace advice given to you by your health care provider. Make sure you discuss any questions you have with your health care provider. °  °Document Released: 04/15/2010 Document Revised: 01/01/2013 Document Reviewed: 10/25/2012 °Elsevier Interactive Patient Education ©2016 [employer] ° °
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
## 2015-12-15 IMAGING — US US OB COMP LESS 14 WK
1 series · 15 of 28 positions shown · non-contrast
Comparison: None.

CLINICAL DATA: Acute onset of right lower quadrant abdominal pain.
Initial encounter.

EXAM:
OBSTETRIC <14 WK US AND TRANSVAGINAL OB US
TECHNIQUE: Both transabdominal and transvaginal ultrasound examinations were
performed for complete evaluation of the gestation as well as the
maternal uterus, adnexal regions, and pelvic cul-de-sac.
Transvaginal technique was performed to assess early pregnancy.

[Series 1: us ob comp less 14 wk · 15 of 40 slices shown]
[im 1/40]
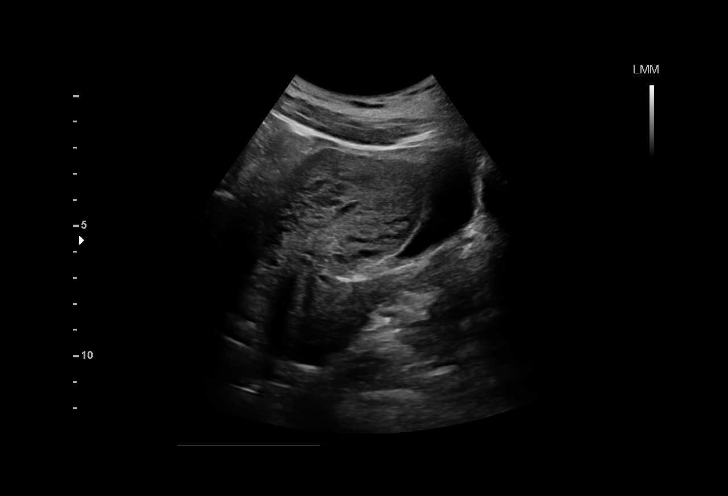
[im 3/40]
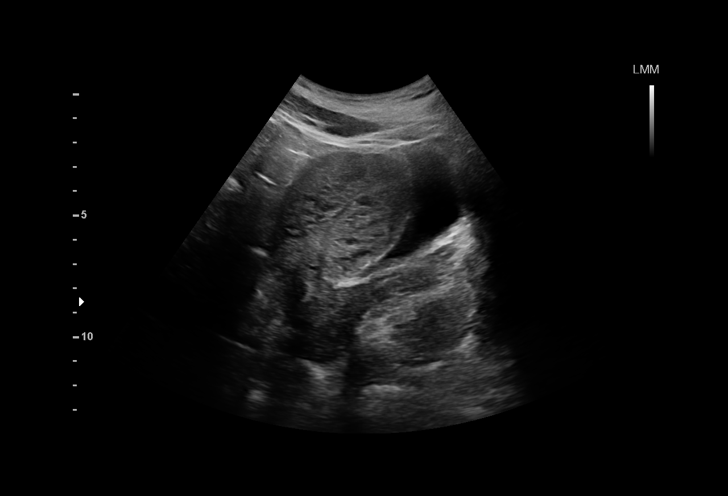
[im 6/40]
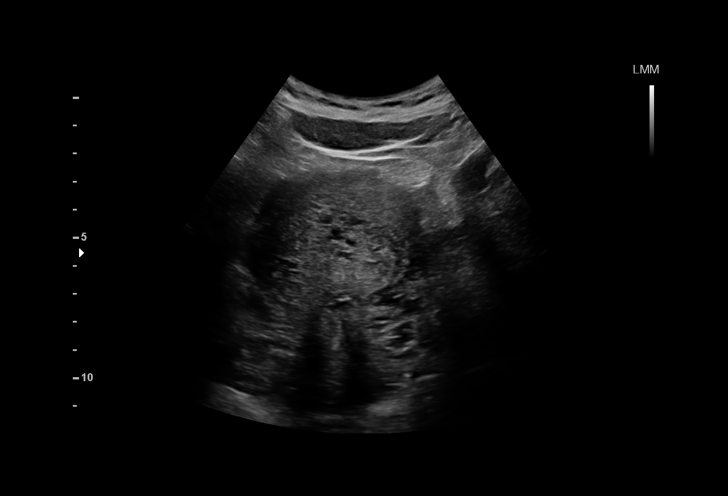
[im 9/40]
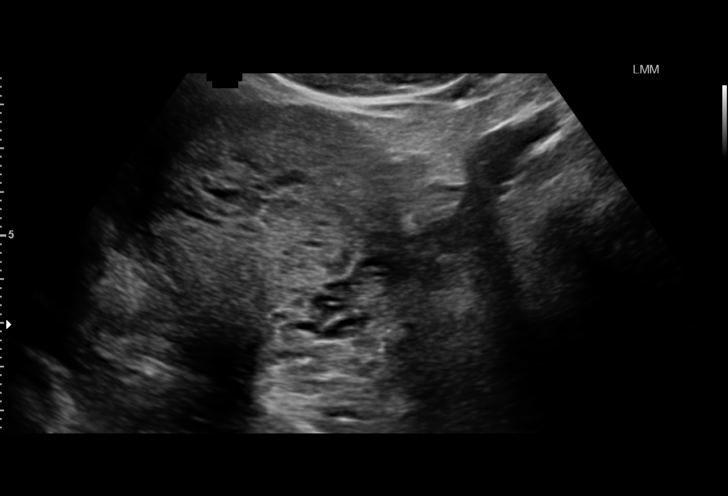
[im 12/40]
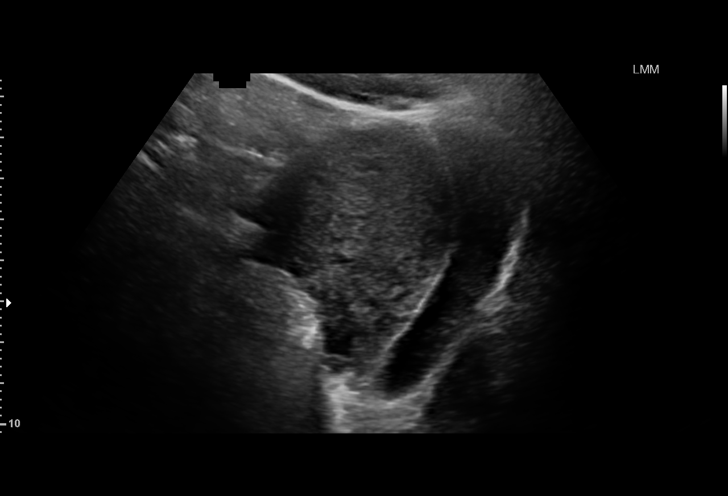
[im 15/40]
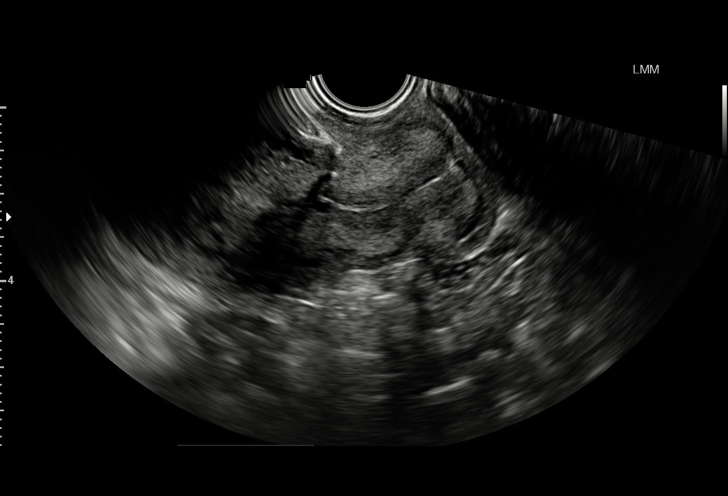
[im 18/40]
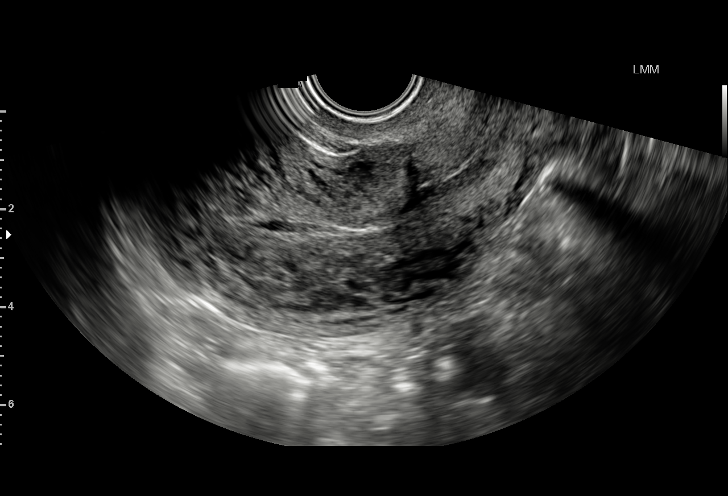
[im 21/40]
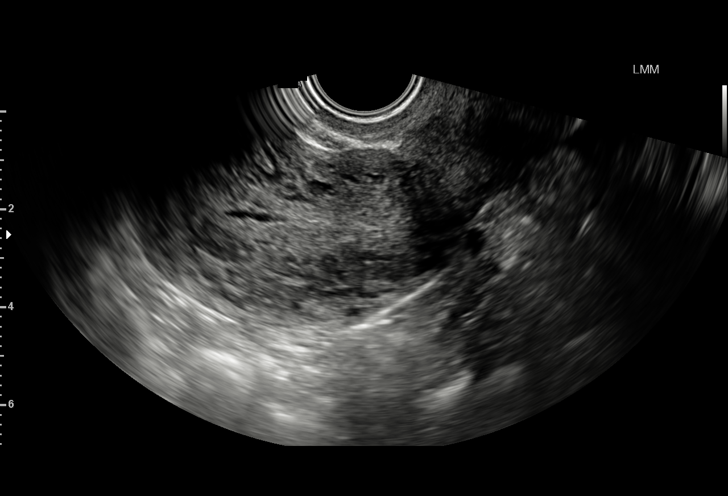
[im 22/40]
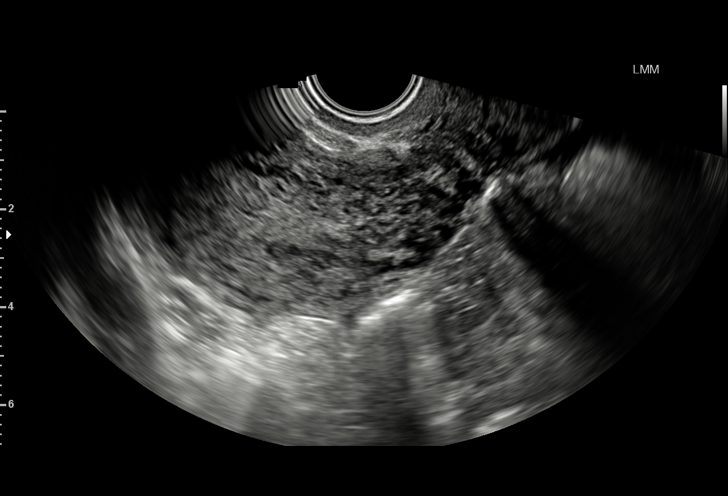
[im 25/40]
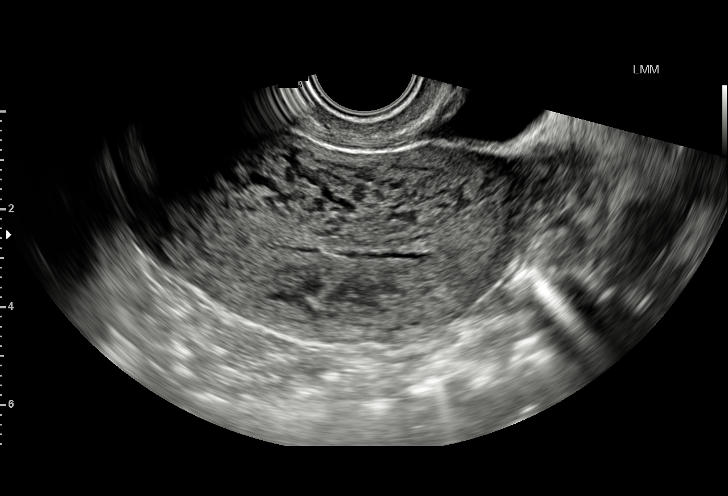
[im 28/40]
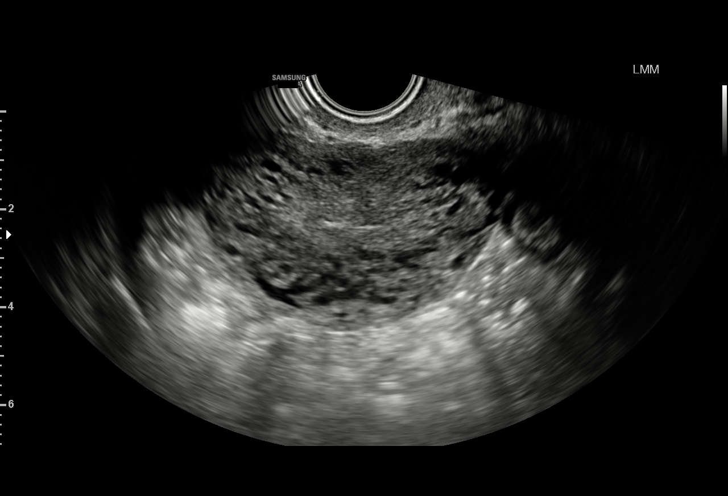
[im 31/40]
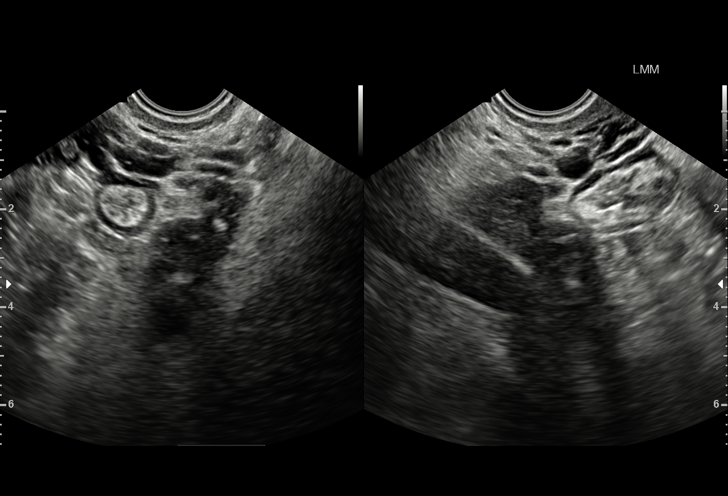
[im 34/40]
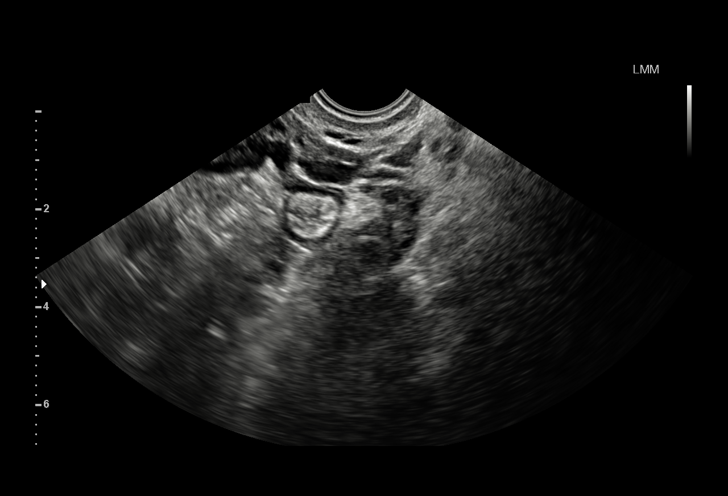
[im 37/40]
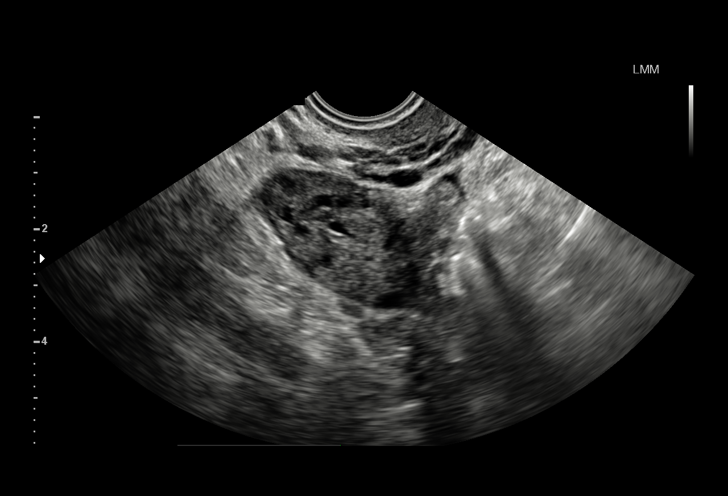
[im 40/40]
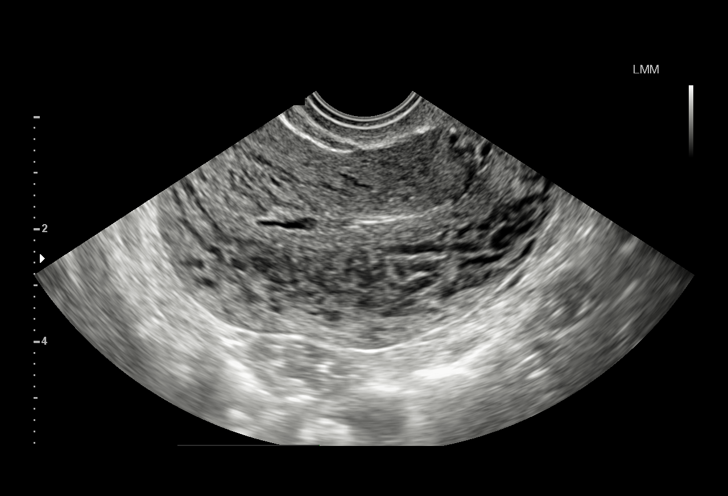

[15 of 28 positions shown; findings below may reference images not displayed]

FINDINGS: Intrauterine gestational sac: None seen.

Yolk sac:  N/A

Embryo:  N/A

Maternal uterus/adnexae: Prominent myometrial vasculature is
nonspecific and likely within normal limits. The uterus otherwise
unremarkable.

The ovaries are within normal limits. The right ovary measures 3.8 x
2.4 x 2.2 cm, while the left ovary measures 2.5 x 1.5 x 1.4 cm. No
suspicious adnexal masses are seen; there is no definite evidence
for ovarian torsion.

Trace free fluid is seen within the pelvic cul-de-sac.
IMPRESSION: 1. No intrauterine gestational sac seen. No evidence for ectopic
pregnancy. This remains within normal limits given the quantitative
beta HCG of 101. Would consider follow-up pelvic ultrasound in 2
weeks, to assess for an intrauterine gestational sac, if
quantitative beta HCG levels continue to rise.
2. Nonspecific prominence of the myometrial vasculature is likely
within normal limits. Ovaries unremarkable in appearance.

## 2015-12-21 IMAGING — US US OB TRANSVAGINAL
1 series · 15 of 24 positions shown · non-contrast
Comparison: Pelvic ultrasound performed 12/27/2014

CLINICAL DATA: Acute onset of right lower quadrant abdominal pain
and vaginal bleeding. Initial encounter.

EXAM:
TRANSVAGINAL OB ULTRASOUND
TECHNIQUE: Transvaginal ultrasound was performed for complete evaluation of the
gestation as well as the maternal uterus, adnexal regions, and
pelvic cul-de-sac.

[Series 1: us ob transvaginal · 15 of 24 slices shown]
[im 1/24]
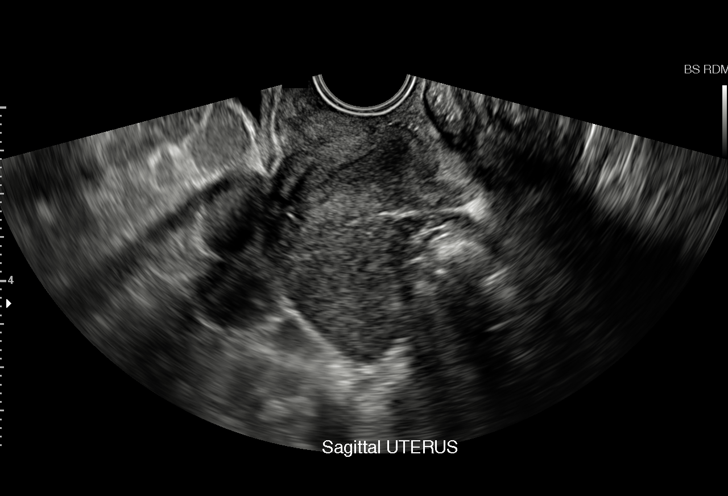
[im 3/24]
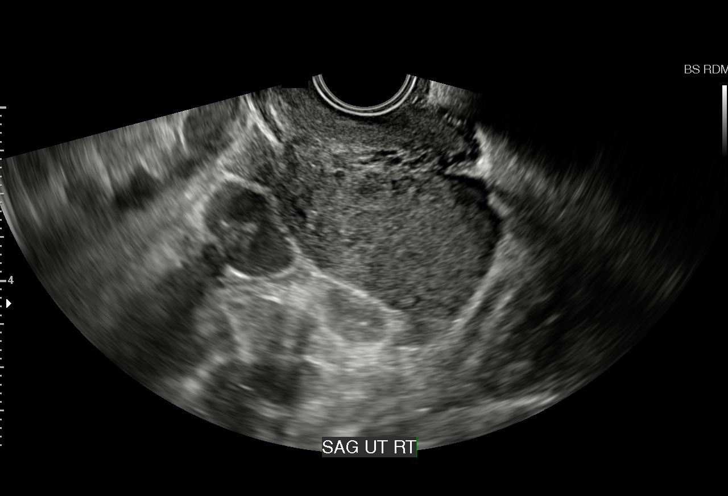
[im 5/24]
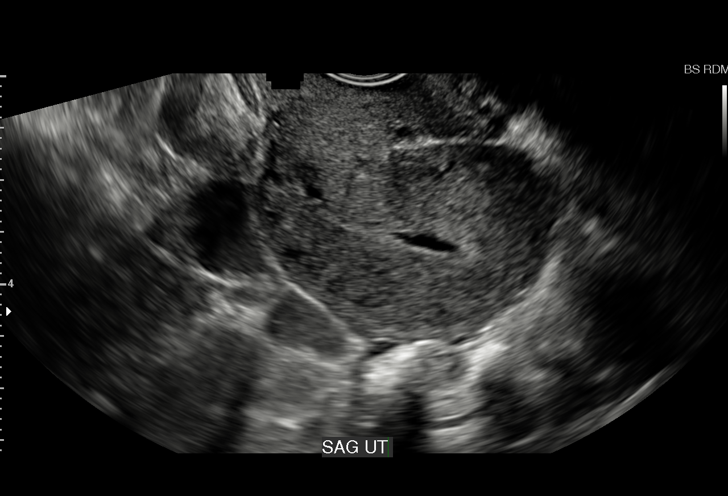
[im 6/24]
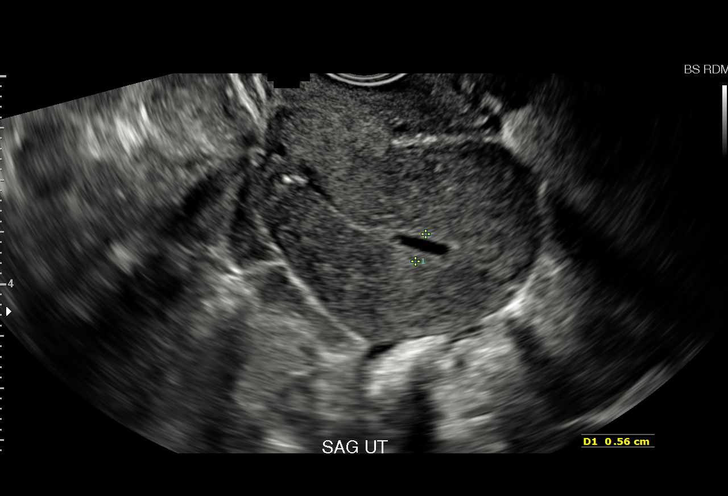
[im 8/24]
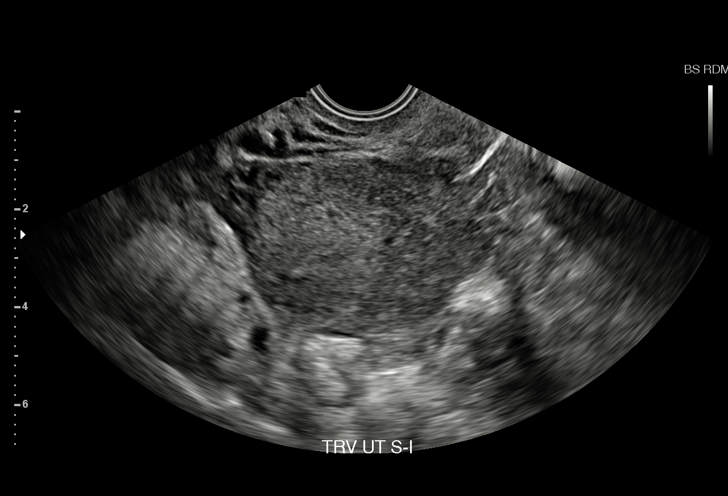
[im 9/24]
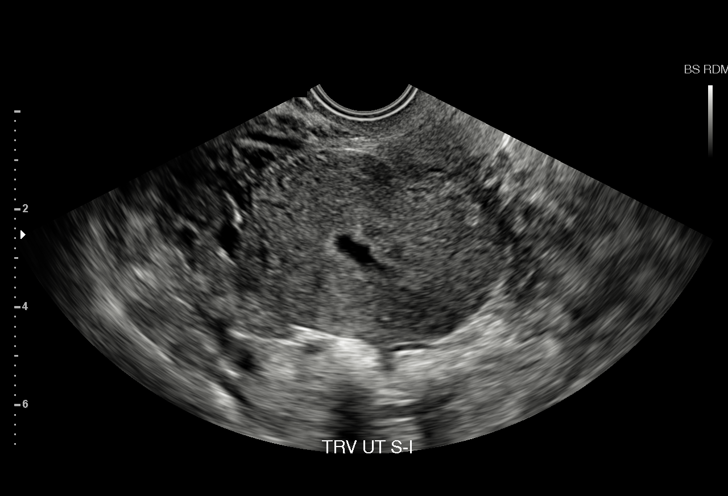
[im 11/24]
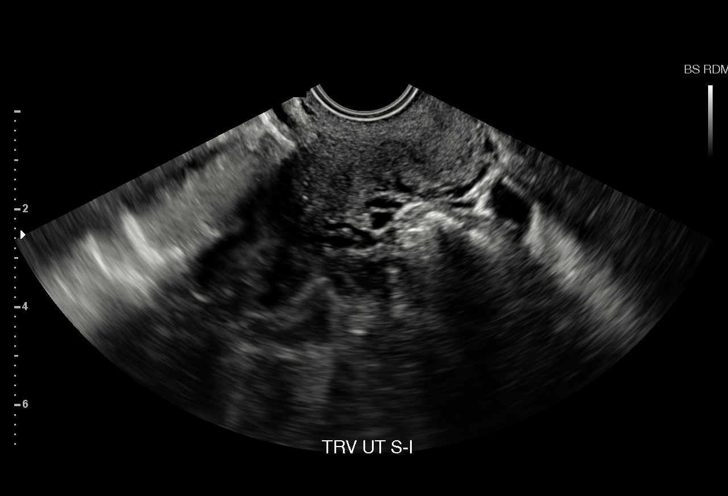
[im 13/24]
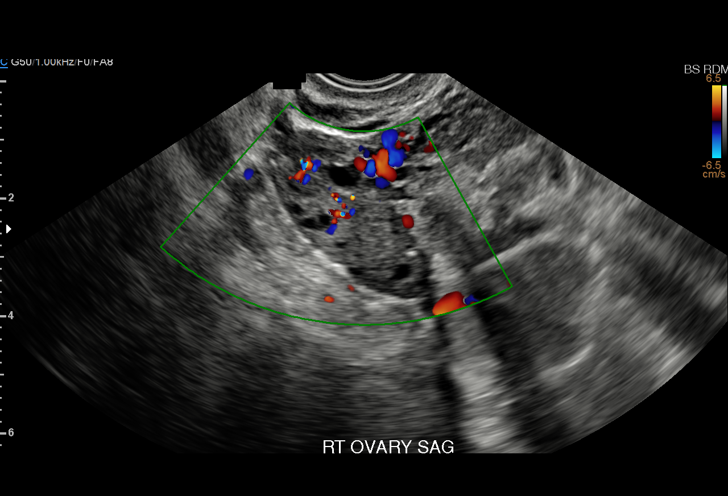
[im 14/24]
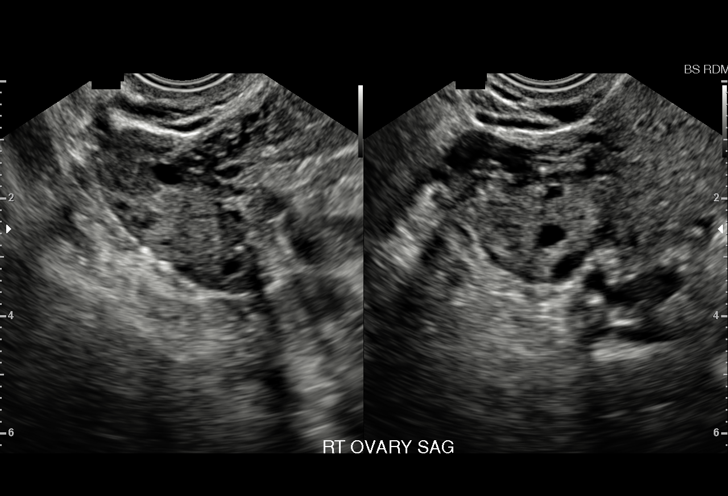
[im 16/24]
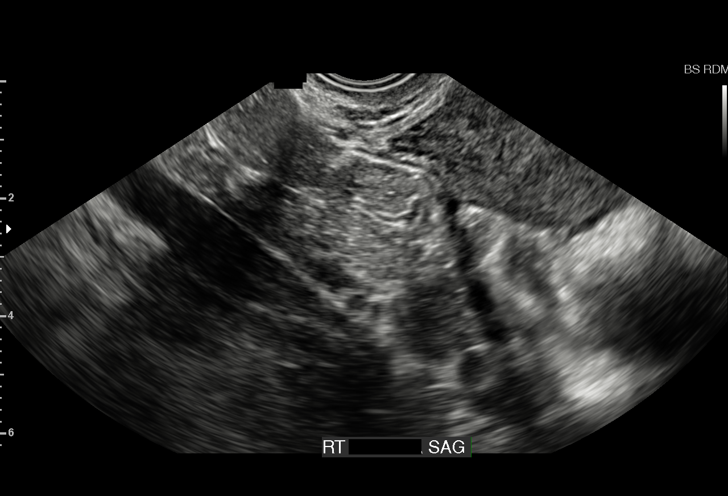
[im 17/24]
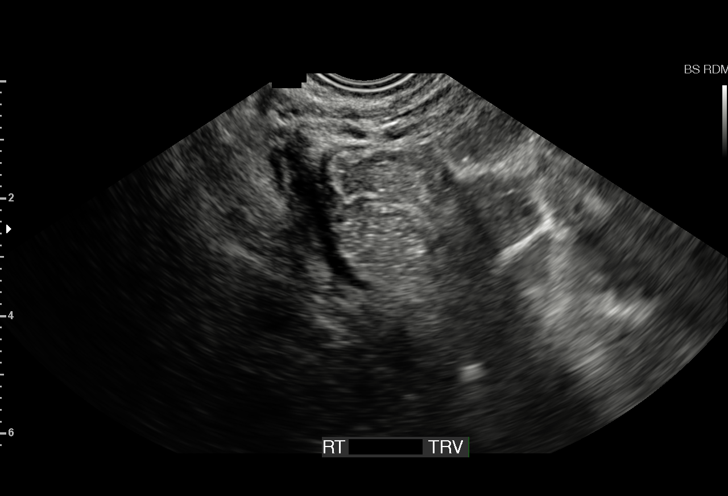
[im 19/24]
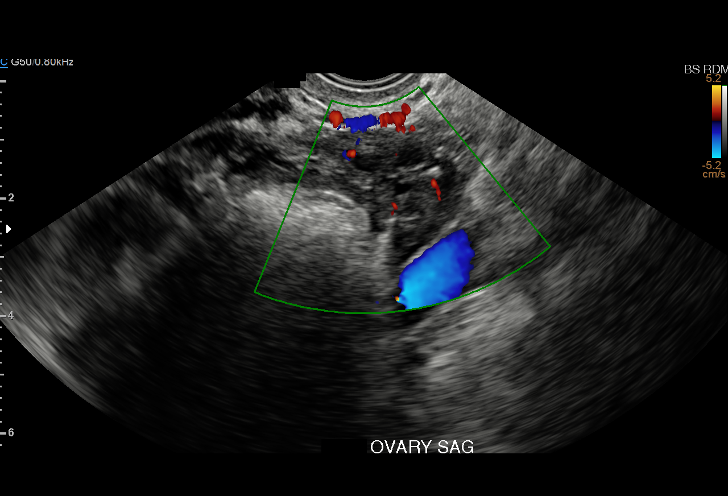
[im 21/24]
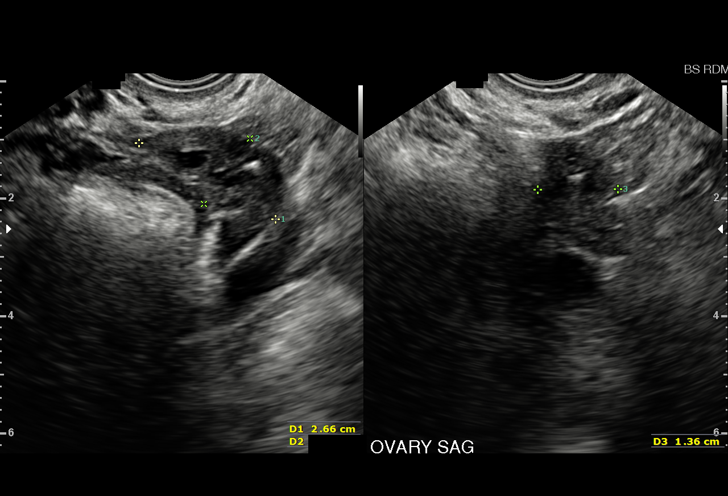
[im 22/24]
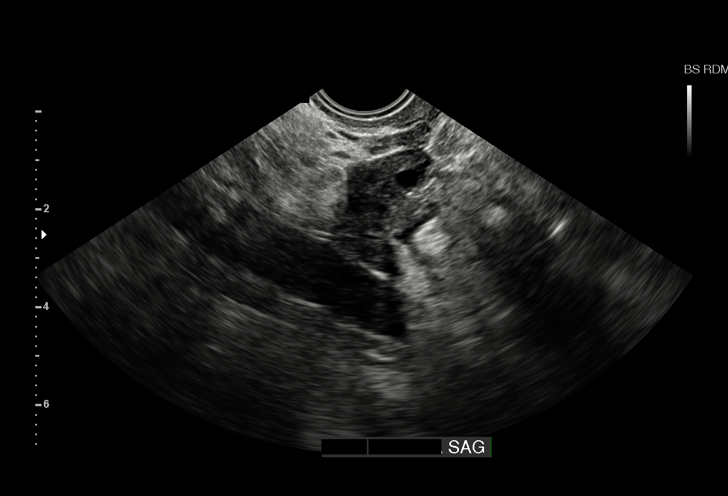
[im 24/24]
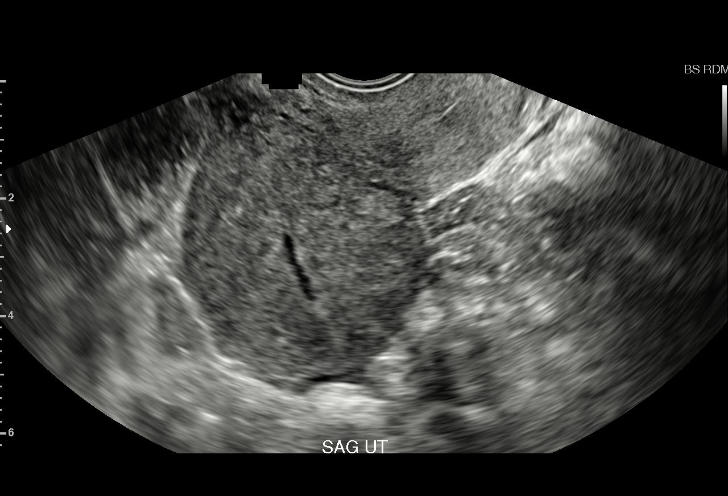

[15 of 24 positions shown; findings below may reference images not displayed]

FINDINGS: Intrauterine gestational sac: None seen.

Yolk sac:  N/A

Embryo:  N/A

Maternal uterus/adnexae: The uterus is unremarkable in appearance. A
small amount of fluid is noted within the endometrial echo complex.
No decidual reaction is now seen. The uterus is retroverted in
nature.

The ovaries are unremarkable in appearance. The right ovary measures
3.5 x 2.3 x 1.7 cm, while the left ovary measures 2.7 x 1.4 x
cm. No suspicious adnexal masses are seen; there is no evidence for
ovarian torsion.

No free fluid is seen within the pelvic cul-de-sac.
IMPRESSION: No intrauterine pregnancy seen. No evidence for ectopic pregnancy.
The quantitative beta HCG level has decreased since last week, and
no decidual reaction is now seen, reflecting completion of the
secretory phase of the ovulatory cycle. Small amount of residual
fluid noted in the endometrial echo complex.

## 2015-12-24 ENCOUNTER — Other Ambulatory Visit (HOSPITAL_COMMUNITY): Payer: Self-pay | Admitting: Obstetrics & Gynecology

## 2015-12-24 DIAGNOSIS — Z3A19 19 weeks gestation of pregnancy: Secondary | ICD-10-CM

## 2015-12-24 DIAGNOSIS — Z3689 Encounter for other specified antenatal screening: Secondary | ICD-10-CM

## 2015-12-28 ENCOUNTER — Ambulatory Visit (HOSPITAL_COMMUNITY)
Admission: RE | Admit: 2015-12-28 | Discharge: 2015-12-28 | Disposition: A | Discharge status: Home / Self Care | Source: Ambulatory Visit | Attending: Obstetrics & Gynecology | Admitting: Obstetrics & Gynecology

## 2015-12-28 ENCOUNTER — Encounter (HOSPITAL_COMMUNITY): Payer: Self-pay

## 2015-12-28 DIAGNOSIS — O98419 Viral hepatitis complicating pregnancy, unspecified trimester: Secondary | ICD-10-CM

## 2015-12-28 DIAGNOSIS — O99322 Drug use complicating pregnancy, second trimester: Secondary | ICD-10-CM | POA: Diagnosis not present

## 2015-12-28 DIAGNOSIS — F1121 Opioid dependence, in remission: Secondary | ICD-10-CM

## 2015-12-28 DIAGNOSIS — Z3A18 18 weeks gestation of pregnancy: Secondary | ICD-10-CM | POA: Diagnosis not present

## 2015-12-28 DIAGNOSIS — Z3A19 19 weeks gestation of pregnancy: Secondary | ICD-10-CM

## 2015-12-28 DIAGNOSIS — Z3689 Encounter for other specified antenatal screening: Secondary | ICD-10-CM

## 2015-12-28 DIAGNOSIS — B182 Chronic viral hepatitis C: Secondary | ICD-10-CM | POA: Diagnosis not present

## 2015-12-28 DIAGNOSIS — O98812 Other maternal infectious and parasitic diseases complicating pregnancy, second trimester: Secondary | ICD-10-CM | POA: Diagnosis not present

## 2015-12-28 DIAGNOSIS — Z79899 Other long term (current) drug therapy: Secondary | ICD-10-CM
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
## 2015-12-29 ENCOUNTER — Other Ambulatory Visit (HOSPITAL_COMMUNITY): Payer: Self-pay | Admitting: *Deleted

## 2015-12-29 DIAGNOSIS — IMO0002 Reserved for concepts with insufficient information to code with codable children: Secondary | ICD-10-CM

## 2015-12-29 DIAGNOSIS — Z0489 Encounter for examination and observation for other specified reasons: Secondary | ICD-10-CM

## 2015-12-30 ENCOUNTER — Encounter (HOSPITAL_COMMUNITY): Payer: Self-pay

## 2015-12-30 ENCOUNTER — Other Ambulatory Visit (HOSPITAL_COMMUNITY): Payer: Self-pay

## 2016-01-07 ENCOUNTER — Other Ambulatory Visit: Payer: Self-pay

## 2016-01-07 NOTE — Telephone Encounter (Signed)
Pharm reqs Rf of seroquel. Dr Clelia CroftShaw, it looks like the pt is now pregnant since last OV, see notes in EPIC

## 2016-01-10 NOTE — Telephone Encounter (Signed)
Thank you.  She is going to need to get this through her OB or her psychiatrist.  If she needs to have [facility] prescribe, she would have to come in and I would need to talk with both her OB and psych before prescribing  Don't necessarily need to tell pt this. It looks like it might be safe in early pregnancy but they request patient's enroll in a national registry to try to get data about safety.  No known teratogenicity risk but there is concern about delayed skeletal ossification so caution in 3rd trimester and concern about neonatal sxs and w/d.

## 2016-01-11 NOTE — Telephone Encounter (Signed)
LMOM advising pt to contact her OB or psychiatrist about this med while pregnant. Asked her to call and set up appt if she needs Dr Clelia CroftShaw to Rx, but Dr Clelia CroftShaw will have to consult w/OB/psych.

## 2016-01-17 ENCOUNTER — Telehealth: Payer: Self-pay | Admitting: Family Medicine

## 2016-01-17 DIAGNOSIS — M545 Low back pain: Principal | ICD-10-CM

## 2016-01-17 DIAGNOSIS — G8929 Other chronic pain: Secondary | ICD-10-CM

## 2016-01-17 NOTE — Telephone Encounter (Signed)
Pt calling stating that she's pregnant and that her Doctor wants her to stay on all of her medicine its not good for her not to be on any and that the patient states that she cant be seen here until her $500 balance has been paid please respond Allison Richard had wanted patient to start seeing Allison LennertSarah Richard

## 2016-01-18 NOTE — Telephone Encounter (Signed)
PATIENT CALLED BACK TO CHECK ON THE STATUS OF HER REFILL REQUEST. I TRIED TO GET THE INFORMATION THAT SARAH NEEDED. THE MEDICATIONS SHE NEEDS REFILLED IS LEXAPRO 20 MG, XANAX - SHE SAID SHE WAS TAKING 6 MG, BUT HAS TAPERED OFF TO ABOUT 2 1/2 MG, SEROQUEL 200 MG AND FLEXORIL 10 MG. SHE SAID SHE NEEDS THE XANAX AS SOON AS POSSIBLE BECAUSE SHE DOES NOT WANT TO HAVE A SEIZURE. SHE SAID HER OBGYN WAS NOT COMFORTABLE IN WRITING HER PRESCRIPTIONS. HER OBGYN IS DR. MEGAN MORRIS AT [facility] AND DR. Ezzard SAundra MillettandingNEWMAN [facility]. SHE SAID [facility] WOULD NOT LET HER MAKE A PAYMENT PLAN AND THAT SHE HAD TO PAY HER BILL IN FULL. SHE HAS TO TALK WITH HER MOTHER ABOUT HELPING HER. BEST PHONE [phone] (CELL)  PHARMACY CHOICE IS [facility] ON [address].  MBC

## 2016-01-18 NOTE — Telephone Encounter (Signed)
Which medication is she referring to?  Will the OB not write for the medications? Who is her OB I will want to talk with them.  I will likely need/want to see the patient for these refills if that is what she is needing - can she set up a payment plan with [facility] at [phone]

## 2016-01-19 NOTE — Telephone Encounter (Signed)
I am also not comfortable writing this patient these medications as I have received nothing from her OB that they are fine with this and this would be required for me to comfortable writing some of these medications.     Pt has refills of Lexapro at the pharmacy as she was given a years supply by Dr Merla Richesoolittle in May.  Who has been writing the Xanax for the patient - the last Rx in [facility] was from 10/2015 for #15 pills - there are no Rx for Xanax in the [address] controlled database so I am not sure where she has been getting this.  I think it would be best for the patient to see someone in behavioral health.

## 2016-01-20 ENCOUNTER — Telehealth: Payer: Self-pay

## 2016-01-20 MED ORDER — CYCLOBENZAPRINE HCL 10 MG PO TABS: 10.0000 mg | ORAL_TABLET | Freq: Three times a day (TID) | ORAL | 5 refills | Status: DC | PRN

## 2016-01-20 MED ORDER — ALPRAZOLAM 1 MG PO TABS: ORAL_TABLET | ORAL | 0 refills | Status: DC

## 2016-01-20 NOTE — Telephone Encounter (Signed)
Spoke with pt. She states that she wants refills on xanax, and flexeril. Told her that she has not been here since May. She has a 500.00 dollar debt that she is unable to pay. [facility]. Her obgyn is uncomfortable writing prescriptions but she wants her to stay on her meds.she wanted refills on lexapro I reminder her that she had 11 refills on that.

## 2016-01-20 NOTE — Telephone Encounter (Addendum)
Please send mediations to the pharmacy  Please call Maternal Fetal medicine - Dr Ezzard StandingNewman and get records of it is ok for her to be on this medication before it is sent to the pharmacy

## 2016-01-20 NOTE — Telephone Encounter (Addendum)
I spoke to patient  She tapered down Xanax 6mg  a day to 2.5mg  a day - she is going to run out in a week - Xanax 2mg  in am , 1mg  in the afternoon and then sometimes another 1mg  at night due to her anxiety -  OB - Dr Langston MaskerMorris - [facility] - unwilling to write for patient but she did write her Seroquel She saw maternal fetal MD - Dr Ezzard StandingNewman - does not want her off the medication but he cannot Rx medications - I have not gotten this paperwork

## 2016-01-20 NOTE — Addendum Note (Signed)
Addended by: Morrell RiddleWEBER, Dayna Geurts L on: 01/20/2016 06:26 PM   Modules accepted: Orders

## 2016-01-20 NOTE — Telephone Encounter (Signed)
Allison LennertSarah Richard   Patient is pregnant and needs consult regarding her medications.   [phone]

## 2016-01-21 NOTE — Telephone Encounter (Signed)
[name], original written rx destroyed by me. Ot advised she needs an appt.  She states she spoke with [name] and will make a payment on money owed soon and then come see us before this refill runs out.

## 2016-01-24 NOTE — Telephone Encounter (Signed)
Could not find listing for Dr Ezzard StandingNewman. Called pt to get contact info and she advised that [name] did fill her Rx last week so nothing more is needed.

## 2016-01-25 ENCOUNTER — Ambulatory Visit (HOSPITAL_COMMUNITY)
Admission: RE | Admit: 2016-01-25 | Discharge: 2016-01-25 | Disposition: A | Discharge status: Home / Self Care | Source: Ambulatory Visit | Attending: Obstetrics & Gynecology | Admitting: Obstetrics & Gynecology

## 2016-01-25 ENCOUNTER — Encounter (HOSPITAL_COMMUNITY): Payer: Self-pay

## 2016-01-25 DIAGNOSIS — O99322 Drug use complicating pregnancy, second trimester: Secondary | ICD-10-CM | POA: Insufficient documentation

## 2016-01-25 DIAGNOSIS — Z362 Encounter for other antenatal screening follow-up: Secondary | ICD-10-CM | POA: Insufficient documentation

## 2016-01-25 DIAGNOSIS — B182 Chronic viral hepatitis C: Secondary | ICD-10-CM | POA: Diagnosis not present

## 2016-01-25 DIAGNOSIS — Z0489 Encounter for examination and observation for other specified reasons: Secondary | ICD-10-CM

## 2016-01-25 DIAGNOSIS — Z3A22 22 weeks gestation of pregnancy: Secondary | ICD-10-CM | POA: Insufficient documentation

## 2016-01-25 DIAGNOSIS — O98412 Viral hepatitis complicating pregnancy, second trimester: Secondary | ICD-10-CM | POA: Diagnosis not present

## 2016-01-25 DIAGNOSIS — IMO0002 Reserved for concepts with insufficient information to code with codable children: Secondary | ICD-10-CM

## 2016-01-25 DIAGNOSIS — O4442 Low lying placenta NOS or without hemorrhage, second trimester: Secondary | ICD-10-CM | POA: Insufficient documentation

## 2016-01-29 ENCOUNTER — Encounter (HOSPITAL_COMMUNITY): Payer: Self-pay | Admitting: *Deleted

## 2016-01-29 ENCOUNTER — Inpatient Hospital Stay (EMERGENCY_DEPARTMENT_HOSPITAL)
Admission: AD | Admit: 2016-01-29 | Discharge: 2016-01-30 | Disposition: A | Discharge status: Home / Self Care | Source: Ambulatory Visit | Attending: Obstetrics & Gynecology | Admitting: Obstetrics & Gynecology

## 2016-01-29 DIAGNOSIS — Z87891 Personal history of nicotine dependence: Secondary | ICD-10-CM | POA: Insufficient documentation

## 2016-01-29 DIAGNOSIS — O99342 Other mental disorders complicating pregnancy, second trimester: Secondary | ICD-10-CM

## 2016-01-29 DIAGNOSIS — F419 Anxiety disorder, unspecified: Secondary | ICD-10-CM

## 2016-01-29 DIAGNOSIS — Z885 Allergy status to narcotic agent status: Secondary | ICD-10-CM | POA: Insufficient documentation

## 2016-01-29 DIAGNOSIS — O26892 Other specified pregnancy related conditions, second trimester: Secondary | ICD-10-CM

## 2016-01-29 DIAGNOSIS — R0602 Shortness of breath: Secondary | ICD-10-CM

## 2016-01-29 DIAGNOSIS — R0789 Other chest pain: Secondary | ICD-10-CM | POA: Diagnosis not present

## 2016-01-29 DIAGNOSIS — R197 Diarrhea, unspecified: Secondary | ICD-10-CM

## 2016-01-29 DIAGNOSIS — F411 Generalized anxiety disorder: Secondary | ICD-10-CM

## 2016-01-29 DIAGNOSIS — F319 Bipolar disorder, unspecified: Secondary | ICD-10-CM

## 2016-01-29 DIAGNOSIS — Z79899 Other long term (current) drug therapy: Secondary | ICD-10-CM

## 2016-01-29 DIAGNOSIS — Z3A23 23 weeks gestation of pregnancy: Secondary | ICD-10-CM | POA: Insufficient documentation

## 2016-01-29 DIAGNOSIS — R079 Chest pain, unspecified: Secondary | ICD-10-CM | POA: Insufficient documentation

## 2016-01-29 DIAGNOSIS — K219 Gastro-esophageal reflux disease without esophagitis: Secondary | ICD-10-CM | POA: Diagnosis not present

## 2016-01-29 LAB — URINALYSIS, ROUTINE W REFLEX MICROSCOPIC
BILIRUBIN URINE: NEGATIVE
Glucose, UA: NEGATIVE mg/dL
Hgb urine dipstick: NEGATIVE
KETONES UR: NEGATIVE mg/dL
Leukocytes, UA: NEGATIVE
NITRITE: NEGATIVE
PH: 7.5 (ref 5.0–8.0)
PROTEIN: NEGATIVE mg/dL
Specific Gravity, Urine: <1.005 — ABNORMAL LOW (ref 1.005–1.030)

## 2016-01-29 LAB — CBC WITH DIFFERENTIAL/PLATELET
BASOS ABS: 0 10*3/uL (ref 0.0–0.1)
Basophils Relative: 0 %
Eosinophils Absolute: 0.1 10*3/uL (ref 0.0–0.7)
Eosinophils Relative: 1 %
HEMATOCRIT: 31.6 % — AB (ref 36.0–46.0)
Hemoglobin: 10.9 g/dL — ABNORMAL LOW (ref 12.0–15.0)
LYMPHS ABS: 2.6 10*3/uL (ref 0.7–4.0)
LYMPHS PCT: 30 %
MCH: 29.6 pg (ref 26.0–34.0)
MCHC: 34.5 g/dL (ref 30.0–36.0)
MCV: 85.9 fL (ref 78.0–100.0)
MONO ABS: 0.5 10*3/uL (ref 0.1–1.0)
Monocytes Relative: 6 %
Neutro Abs: 5.4 10*3/uL (ref 1.7–7.7)
Neutrophils Relative %: 63 %
Platelets: 230 10*3/uL (ref 150–400)
RBC: 3.68 MIL/uL — ABNORMAL LOW (ref 3.87–5.11)
RDW: 13.6 % (ref 11.5–15.5)
WBC: 8.6 10*3/uL (ref 4.0–10.5)

## 2016-01-29 LAB — COMPREHENSIVE METABOLIC PANEL
ALT: 51 U/L (ref 14–54)
AST: 30 U/L (ref 15–41)
Albumin: 2.9 g/dL — ABNORMAL LOW (ref 3.5–5.0)
Alkaline Phosphatase: 54 U/L (ref 38–126)
Anion gap: 6 (ref 5–15)
BILIRUBIN TOTAL: 0.5 mg/dL (ref 0.3–1.2)
CO2: 24 mmol/L (ref 22–32)
CREATININE: 0.42 mg/dL — AB (ref 0.44–1.00)
Calcium: 8.5 mg/dL — ABNORMAL LOW (ref 8.9–10.3)
Chloride: 107 mmol/L (ref 101–111)
GFR calc Af Amer: >60 mL/min (ref >60)
Glucose, Bld: 93 mg/dL (ref 65–99)
POTASSIUM: 3.8 mmol/L (ref 3.5–5.1)
Sodium: 137 mmol/L (ref 135–145)
TOTAL PROTEIN: 6.6 g/dL (ref 6.5–8.1)

## 2016-01-29 LAB — RAPID URINE DRUG SCREEN, HOSP PERFORMED
Amphetamines: NOT DETECTED
BARBITURATES: NOT DETECTED
Benzodiazepines: POSITIVE — AB
COCAINE: NOT DETECTED
OPIATES: NOT DETECTED
Tetrahydrocannabinol: NOT DETECTED

## 2016-01-29 MED ORDER — PROMETHAZINE HCL 25 MG/ML IJ SOLN
25.0000 mg | Freq: Four times a day (QID) | INTRAMUSCULAR | Status: DC | PRN
  Administered 2016-01-29: 25 mg via INTRAMUSCULAR
  Filled 2016-01-29: qty 1

## 2016-01-29 MED ORDER — PROMETHAZINE HCL 25 MG/ML IJ SOLN: 25.0000 mg | Freq: Four times a day (QID) | INTRAMUSCULAR | Status: DC | PRN

## 2016-01-29 NOTE — MAU Note (Signed)
Patient presents to mau via EMS with c/o chest pain and "feeling short of breath". States has felt like this before but it usually resolves when she sits ups. This time she states nothing is helping; pain started mid chest and radiated up to her "sinuses". Shortness of breath started following the pain. States she just feels clammy. Reports having a "GI bug" for the past week with vomiting/diarrhea.
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
## 2016-01-29 NOTE — MAU Note (Signed)
12 lead EKG completed on EMS; copy of EKG left with RN and given to MD.

## 2016-01-29 NOTE — MAU Note (Signed)
Patient tolerating po hydration at this time.

## 2016-01-29 NOTE — MAU Note (Signed)
PO hydration started; water pitcher given to patient per MD.

## 2016-01-30 ENCOUNTER — Encounter (HOSPITAL_COMMUNITY): Payer: Self-pay | Admitting: *Deleted

## 2016-01-30 ENCOUNTER — Emergency Department (HOSPITAL_COMMUNITY)

## 2016-01-30 ENCOUNTER — Emergency Department (HOSPITAL_COMMUNITY)
Admission: EM | Admit: 2016-01-30 | Discharge: 2016-01-30 | Disposition: A | Discharge status: Home / Self Care | Attending: Emergency Medicine | Admitting: Emergency Medicine

## 2016-01-30 DIAGNOSIS — R0789 Other chest pain: Secondary | ICD-10-CM | POA: Diagnosis not present

## 2016-01-30 DIAGNOSIS — R197 Diarrhea, unspecified: Secondary | ICD-10-CM | POA: Diagnosis not present

## 2016-01-30 DIAGNOSIS — K219 Gastro-esophageal reflux disease without esophagitis: Secondary | ICD-10-CM | POA: Insufficient documentation

## 2016-01-30 DIAGNOSIS — F411 Generalized anxiety disorder: Secondary | ICD-10-CM

## 2016-01-30 DIAGNOSIS — Z87891 Personal history of nicotine dependence: Secondary | ICD-10-CM | POA: Insufficient documentation

## 2016-01-30 DIAGNOSIS — O99342 Other mental disorders complicating pregnancy, second trimester: Secondary | ICD-10-CM

## 2016-01-30 LAB — GASTROINTESTINAL PANEL BY PCR, STOOL (REPLACES STOOL CULTURE)
Adenovirus F40/41: NOT DETECTED
Astrovirus: NOT DETECTED
CAMPYLOBACTER SPECIES: NOT DETECTED
CRYPTOSPORIDIUM: NOT DETECTED
CYCLOSPORA CAYETANENSIS: NOT DETECTED
Entamoeba histolytica: NOT DETECTED
Enteroaggregative E coli (EAEC): DETECTED — AB
Enteropathogenic E coli (EPEC): NOT DETECTED
Enterotoxigenic E coli (ETEC): NOT DETECTED
Giardia lamblia: NOT DETECTED
Norovirus GI/GII: NOT DETECTED
PLESIMONAS SHIGELLOIDES: NOT DETECTED
Rotavirus A: NOT DETECTED
SALMONELLA SPECIES: NOT DETECTED
SAPOVIRUS (I, II, IV, AND V): NOT DETECTED
SHIGA LIKE TOXIN PRODUCING E COLI (STEC): NOT DETECTED
Shigella/Enteroinvasive E coli (EIEC): NOT DETECTED
VIBRIO SPECIES: NOT DETECTED
Vibrio cholerae: NOT DETECTED
YERSINIA ENTEROCOLITICA: NOT DETECTED

## 2016-01-30 LAB — I-STAT TROPONIN, ED: Troponin i, poc: 0 ng/mL (ref 0.00–0.08)

## 2016-01-30 MED ORDER — PROMETHAZINE HCL 25 MG PO TABS: 25.0000 mg | ORAL_TABLET | Freq: Four times a day (QID) | ORAL | 1 refills | Status: DC | PRN

## 2016-01-30 MED ORDER — ALUM & MAG HYDROXIDE-SIMETH 200-200-20 MG/5ML PO SUSP
30.0000 mL | Freq: Once | ORAL | Status: AC
  Administered 2016-01-30: 30 mL via ORAL
  Filled 2016-01-30: qty 30

## 2016-01-30 MED ORDER — ACETAMINOPHEN 325 MG PO TABS
650.0000 mg | ORAL_TABLET | Freq: Once | ORAL | Status: AC
  Administered 2016-01-30: 650 mg via ORAL
  Filled 2016-01-30: qty 2

## 2016-01-30 MED ORDER — FAMOTIDINE 20 MG PO TABS
20.0000 mg | ORAL_TABLET | Freq: Once | ORAL | Status: AC
  Administered 2016-01-30: 20 mg via ORAL
  Filled 2016-01-30: qty 1

## 2016-01-30 NOTE — ED Notes (Signed)
OB Rapid response RN consulted with Tresa EndoKelly, RN

## 2016-01-30 NOTE — ED Notes (Signed)
Patient transported to X-ray 
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
## 2016-01-30 NOTE — Discharge Instructions (Signed)
Chest Wall Pain Chest wall pain is pain in or around the bones and muscles of your chest. Sometimes, an injury causes this pain. Sometimes, the cause may not be known. This pain may take several weeks or longer to get better. HOME CARE INSTRUCTIONS  Pay attention to any changes in your symptoms. Take these actions to help with your pain:   Rest as told by your health care provider.   Avoid activities that cause pain. These include any activities that use your chest muscles or your abdominal and side muscles to lift heavy items.   If directed, apply ice to the painful area:  Put ice in a plastic bag.  Place a towel between your skin and the bag.  Leave the ice on for 20 minutes, 2-3 times per day.  Take over-the-counter and prescription medicines only as told by your health care provider.  Do not use tobacco products, including cigarettes, chewing tobacco, and e-cigarettes. If you need help quitting, ask your health care provider.  Keep all follow-up visits as told by your health care provider. This is important. SEEK MEDICAL CARE IF:  You have a fever.  Your chest pain becomes worse.  You have new symptoms. SEEK IMMEDIATE MEDICAL CARE IF:  You have nausea or vomiting.  You feel sweaty or light-headed.  You have a cough with phlegm (sputum) or you cough up blood.  You develop shortness of breath.   This information is not intended to replace advice given to you by your health care provider. Make sure you discuss any questions you have with your health care provider.   Document Released: 03/13/2005 Document Revised: 12/02/2014 Document Reviewed: 06/08/2014 Elsevier Interactive Patient Education 2016 [employer]   Food Choices to Help Relieve Diarrhea, Adult When you have diarrhea, the foods you eat and your eating habits are very important. Choosing the right foods and drinks can help relieve diarrhea. Also, because diarrhea can last up to 7 days, you need to replace  lost fluids and electrolytes (such as sodium, potassium, and chloride) in order to help prevent dehydration.  WHAT GENERAL GUIDELINES DO I NEED TO FOLLOW?  Slowly drink 1 cup (8 oz) of fluid for each episode of diarrhea. If you are getting enough fluid, your urine will be clear or pale yellow.  Eat starchy foods. Some good choices include white rice, white toast, pasta, low-fiber cereal, baked potatoes (without the skin), saltine crackers, and bagels.  Avoid large servings of any cooked vegetables.  Limit fruit to two servings per day. A serving is  cup or 1 small piece.  Choose foods with less than 2 g of fiber per serving.  Limit fats to less than 8 tsp (38 g) per day.  Avoid fried foods.  Eat foods that have probiotics in them. Probiotics can be found in certain dairy products.  Avoid foods and beverages that may increase the speed at which food moves through the stomach and intestines (gastrointestinal tract). Things to avoid include:  High-fiber foods, such as dried fruit, raw fruits and vegetables, nuts, seeds, and whole grain foods.  Spicy foods and high-fat foods.  Foods and beverages sweetened with high-fructose corn syrup, honey, or sugar alcohols such as xylitol, sorbitol, and mannitol. WHAT FOODS ARE RECOMMENDED? Grains White rice. White, [place]French, or pita breads (fresh or toasted), including plain rolls, buns, or bagels. White pasta. Saltine, soda, or graham crackers. Pretzels. Low-fiber cereal. Cooked cereals made with water (such as cornmeal, farina, or cream cereals). Plain muffins. Matzo.  Melba toast. Zwieback.  Vegetables Potatoes (without the skin). Strained tomato and vegetable juices. Most well-cooked and canned vegetables without seeds. Tender lettuce. Fruits Cooked or canned applesauce, apricots, cherries, fruit cocktail, grapefruit, peaches, pears, or plums. Fresh bananas, apples without skin, cherries, grapes, cantaloupe, grapefruit, peaches, oranges, or plums.   Meat and Other Protein Products Baked or boiled chicken. Eggs. Tofu. Fish. Seafood. Smooth peanut butter. Ground or well-cooked tender beef, ham, veal, lamb, pork, or poultry.  Dairy Plain yogurt, kefir, and unsweetened liquid yogurt. Lactose-free milk, buttermilk, or soy milk. Plain hard cheese. Beverages Sport drinks. Clear broths. Diluted fruit juices (except prune). Regular, caffeine-free sodas such as ginger ale. Water. Decaffeinated teas. Oral rehydration solutions. Sugar-free beverages not sweetened with sugar alcohols. Other Bouillon, broth, or soups made from recommended foods.  The items listed above may not be a complete list of recommended foods or beverages. Contact your dietitian for more options. WHAT FOODS ARE NOT RECOMMENDED? Grains Whole grain, whole wheat, bran, or rye breads, rolls, pastas, crackers, and cereals. Wild or brown rice. Cereals that contain more than 2 g of fiber per serving. Corn tortillas or taco shells. Cooked or dry oatmeal. Granola. Popcorn. Vegetables Raw vegetables. Cabbage, broccoli, Brussels sprouts, artichokes, baked beans, beet greens, corn, kale, legumes, peas, sweet potatoes, and yams. Potato skins. Cooked spinach and cabbage. Fruits Dried fruit, including raisins and dates. Raw fruits. Stewed or dried prunes. Fresh apples with skin, apricots, mangoes, pears, raspberries, and strawberries.  Meat and Other Protein Products Chunky peanut butter. Nuts and seeds. Beans and lentils. Tomasa BlaseBacon.  Dairy High-fat cheeses. Milk, chocolate milk, and beverages made with milk, such as milk shakes. Cream. Ice cream. Sweets and Desserts Sweet rolls, doughnuts, and sweet breads. Pancakes and waffles. Fats and Oils Butter. Cream sauces. Margarine. Salad oils. Plain salad dressings. Olives. Avocados.  Beverages Caffeinated beverages (such as coffee, tea, soda, or energy drinks). Alcoholic beverages. Fruit juices with pulp. Prune juice. Soft drinks sweetened with  high-fructose corn syrup or sugar alcohols. Other Coconut. Hot sauce. Chili powder. Mayonnaise. Gravy. Cream-based or milk-based soups.  The items listed above may not be a complete list of foods and beverages to avoid. Contact your dietitian for more information. WHAT SHOULD I DO IF I BECOME DEHYDRATED? Diarrhea can sometimes lead to dehydration. Signs of dehydration include dark urine and dry mouth and skin. If you think you are dehydrated, you should rehydrate with an oral rehydration solution. These solutions can be purchased at pharmacies, retail stores, or online.  Drink -1 cup (120-240 mL) of oral rehydration solution each time you have an episode of diarrhea. If drinking this amount makes your diarrhea worse, try drinking smaller amounts more often. For example, drink 1-3 tsp (5-15 mL) every 5-10 minutes.  A general rule for staying hydrated is to drink 1-2 L of fluid per day. Talk to your health care provider about the specific amount you should be drinking each day. Drink enough fluids to keep your urine clear or pale yellow.   This information is not intended to replace advice given to you by your health care provider. Make sure you discuss any questions you have with your health care provider.   Document Released: 06/03/2003 Document Revised: 04/03/2014 Document Reviewed: 02/03/2013 Elsevier Interactive Patient Education [employer]2016 Elsevier Inc.

## 2016-01-30 NOTE — Discharge Instructions (Signed)
It was our pleasure to provide your ER care today - we hope that you feel better.  Try taking pepcid/zantac as need. You may also try maalox or mylanta.  Follow up with your ob/gyn doctor in the next 1-2 weeks.  Return to ER if worse, new symptoms, fevers, trouble breathing, other concern.

## 2016-01-30 NOTE — ED Triage Notes (Signed)
Pt reports mid chest pressure that radiates up into her neck. Had episode yesterday and [facility]. Was told to come here if the pain returned. Pt is approx [redacted] weeks pregnant. [facility] bp 119/75 at triage.

## 2016-01-30 NOTE — ED Notes (Signed)
ED Provider at bedside. 

## 2016-02-21 ENCOUNTER — Encounter: Payer: Self-pay | Admitting: Interventional Cardiology

## 2016-02-27 DIAGNOSIS — R0602 Shortness of breath: Secondary | ICD-10-CM | POA: Insufficient documentation

## 2016-02-28 ENCOUNTER — Ambulatory Visit: Admitting: Interventional Cardiology

## 2016-02-29 ENCOUNTER — Encounter: Payer: Self-pay | Admitting: Interventional Cardiology

## 2016-04-12 ENCOUNTER — Encounter (HOSPITAL_COMMUNITY): Payer: Self-pay

## 2016-04-17 ENCOUNTER — Encounter (HOSPITAL_COMMUNITY)
Admission: RE | Admit: 2016-04-17 | Discharge: 2016-04-17 | Disposition: A | Discharge status: Home / Self Care | Payer: Medicaid Other | Source: Ambulatory Visit

## 2016-04-25 ENCOUNTER — Ambulatory Visit (INDEPENDENT_AMBULATORY_CARE_PROVIDER_SITE_OTHER): Payer: Self-pay | Admitting: Pediatrics

## 2016-04-25 DIAGNOSIS — Z7681 Expectant parent(s) prebirth pediatrician visit: Secondary | ICD-10-CM

## 2016-04-25 DIAGNOSIS — Z3490 Encounter for supervision of normal pregnancy, unspecified, unspecified trimester: Secondary | ICD-10-CM

## 2016-04-27 NOTE — Progress Notes (Signed)
Prenatal counseling for impending newborn done--2nd child, currently 35 wks, maternal HTN, maternal hep c, Csec 2/23, prenatal care 6wks.  Z76.81

## 2016-05-04 ENCOUNTER — Encounter (HOSPITAL_COMMUNITY): Payer: Self-pay

## 2016-05-04 LAB — OB RESULTS CONSOLE GBS: GBS: NEGATIVE
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
## 2016-05-17 ENCOUNTER — Encounter (HOSPITAL_COMMUNITY)
Admission: RE | Admit: 2016-05-17 | Discharge: 2016-05-17 | Disposition: A | Discharge status: Home / Self Care | Payer: Medicaid Other | Source: Ambulatory Visit | Attending: Obstetrics and Gynecology | Admitting: Obstetrics and Gynecology

## 2016-05-17 LAB — CBC
HCT: 32.6 % — ABNORMAL LOW (ref 36.0–46.0)
Hemoglobin: 10.4 g/dL — ABNORMAL LOW (ref 12.0–15.0)
MCH: 24.5 pg — ABNORMAL LOW (ref 26.0–34.0)
MCHC: 31.9 g/dL (ref 30.0–36.0)
MCV: 76.7 fL — ABNORMAL LOW (ref 78.0–100.0)
PLATELETS: 152 10*3/uL (ref 150–400)
RBC: 4.25 MIL/uL (ref 3.87–5.11)
RDW: 20.8 % — ABNORMAL HIGH (ref 11.5–15.5)
WBC: 8.7 10*3/uL (ref 4.0–10.5)

## 2016-05-17 LAB — TYPE AND SCREEN
ABO/RH(D): O POS
Antibody Screen: NEGATIVE

## 2016-05-17 NOTE — Patient Instructions (Signed)
20 Allison MinisterKatherine Richard  05/17/2016   Your procedure is scheduled on:  05/18/2016  Enter through [facility] at 1100 AM.  Pick up the phone at the desk and dial [phone].   Call this number if you have problems the morning of surgery: [phone]   Remember:   Do not eat food:After Midnight.  Do not drink clear liquids: After Midnight.  Take these medicines the morning of surgery with A SIP OF WATER: none   Do not wear jewelry, make-up or nail polish.  Do not wear lotions, powders, or perfumes. Do not wear deodorant.  Do not shave 48 hours prior to surgery.  Do not bring valuables to the hospital.  [facility] is not   responsible for any belongings or valuables brought to the hospital.  Contacts, dentures or bridgework may not be worn into surgery.  Leave suitcase in the car. After surgery it may be brought to your room.  For patients admitted to the hospital, checkout time is 11:00 AM the day of              discharge.   Patients discharged the day of surgery will not be allowed to drive             home.  Name and phone number of your driver: na  Special Instructions:   N/A   Please read over the following fact sheets that you were given:   Surgical Site Infection Prevention

## 2016-05-18 ENCOUNTER — Inpatient Hospital Stay (HOSPITAL_COMMUNITY): Payer: Medicaid Other | Admitting: Anesthesiology

## 2016-05-18 ENCOUNTER — Encounter (HOSPITAL_COMMUNITY)
Admission: RE | Admit: 2016-05-18 | Discharge: 2016-05-18 | Disposition: A | Discharge status: Home / Self Care | Payer: Medicaid Other | Source: Ambulatory Visit

## 2016-05-18 ENCOUNTER — Encounter (HOSPITAL_COMMUNITY)
Admission: RE | Disposition: A | Discharge status: Home / Self Care | Payer: Self-pay | Source: Ambulatory Visit | Attending: Obstetrics & Gynecology

## 2016-05-18 ENCOUNTER — Encounter (HOSPITAL_COMMUNITY): Payer: Self-pay | Admitting: *Deleted

## 2016-05-18 ENCOUNTER — Inpatient Hospital Stay (HOSPITAL_COMMUNITY)
Admission: RE | Admit: 2016-05-18 | Discharge: 2016-05-20 | DRG: 765 | Disposition: A | Discharge status: Home / Self Care | Payer: Medicaid Other | Source: Ambulatory Visit | Attending: Obstetrics & Gynecology | Admitting: Obstetrics & Gynecology

## 2016-05-18 DIAGNOSIS — O9952 Diseases of the respiratory system complicating childbirth: Secondary | ICD-10-CM | POA: Diagnosis present

## 2016-05-18 DIAGNOSIS — F319 Bipolar disorder, unspecified: Secondary | ICD-10-CM | POA: Diagnosis present

## 2016-05-18 DIAGNOSIS — Z3A39 39 weeks gestation of pregnancy: Secondary | ICD-10-CM | POA: Diagnosis not present

## 2016-05-18 DIAGNOSIS — O99344 Other mental disorders complicating childbirth: Secondary | ICD-10-CM | POA: Diagnosis present

## 2016-05-18 DIAGNOSIS — O34211 Maternal care for low transverse scar from previous cesarean delivery: Secondary | ICD-10-CM | POA: Diagnosis present

## 2016-05-18 DIAGNOSIS — Z98891 History of uterine scar from previous surgery: Secondary | ICD-10-CM

## 2016-05-18 DIAGNOSIS — O9842 Viral hepatitis complicating childbirth: Secondary | ICD-10-CM | POA: Diagnosis present

## 2016-05-18 DIAGNOSIS — J45909 Unspecified asthma, uncomplicated: Secondary | ICD-10-CM | POA: Diagnosis present

## 2016-05-18 DIAGNOSIS — Z87891 Personal history of nicotine dependence: Secondary | ICD-10-CM | POA: Diagnosis not present

## 2016-05-18 DIAGNOSIS — B1920 Unspecified viral hepatitis C without hepatic coma: Secondary | ICD-10-CM | POA: Diagnosis present

## 2016-05-18 LAB — RPR: RPR Ser Ql: NONREACTIVE

## 2016-05-18 SURGERY — Surgical Case
Anesthesia: Spinal

## 2016-05-18 MED ORDER — ONDANSETRON HCL 4 MG/2ML IJ SOLN: 4.0000 mg | Freq: Three times a day (TID) | INTRAMUSCULAR | Status: DC | PRN

## 2016-05-18 MED ORDER — LACTATED RINGERS IV SOLN
INTRAVENOUS | Status: DC
  Administered 2016-05-18 (×3): via INTRAVENOUS

## 2016-05-18 MED ORDER — NALBUPHINE HCL 10 MG/ML IJ SOLN: 5.0000 mg | Freq: Once | INTRAMUSCULAR | Status: DC | PRN

## 2016-05-18 MED ORDER — KETOROLAC TROMETHAMINE 30 MG/ML IJ SOLN: 30.0000 mg | Freq: Four times a day (QID) | INTRAMUSCULAR | Status: AC | PRN

## 2016-05-18 MED ORDER — LACTATED RINGERS IV SOLN
INTRAVENOUS | Status: DC
  Administered 2016-05-18: 23:00:00 via INTRAVENOUS

## 2016-05-18 MED ORDER — OXYMETAZOLINE HCL 0.05 % NA SOLN: 1.0000 | Freq: Two times a day (BID) | NASAL | Status: DC | PRN

## 2016-05-18 MED ORDER — DIPHENHYDRAMINE HCL 25 MG PO CAPS
25.0000 mg | ORAL_CAPSULE | ORAL | Status: DC | PRN
Start: 2016-05-18 — End: 2016-05-20
  Administered 2016-05-19 (×2): 25 mg via ORAL
  Filled 2016-05-18 (×2): qty 1

## 2016-05-18 MED ORDER — MENTHOL 3 MG MT LOZG: 1.0000 | LOZENGE | OROMUCOSAL | Status: DC | PRN

## 2016-05-18 MED ORDER — KETOROLAC TROMETHAMINE 30 MG/ML IJ SOLN
30.0000 mg | Freq: Four times a day (QID) | INTRAMUSCULAR | Status: AC | PRN
Start: 2016-05-18 — End: 2016-05-19
  Administered 2016-05-18: 30 mg via INTRAMUSCULAR

## 2016-05-18 MED ORDER — CEFAZOLIN SODIUM-DEXTROSE 2-3 GM-% IV SOLR
INTRAVENOUS | Status: DC | PRN
  Administered 2016-05-18: 2 g via INTRAVENOUS

## 2016-05-18 MED ORDER — ALBUTEROL SULFATE (2.5 MG/3ML) 0.083% IN NEBU: 3.0000 mL | INHALATION_SOLUTION | Freq: Four times a day (QID) | RESPIRATORY_TRACT | Status: DC | PRN

## 2016-05-18 MED ORDER — SIMETHICONE 80 MG PO CHEW
80.0000 mg | CHEWABLE_TABLET | Freq: Three times a day (TID) | ORAL | Status: DC
  Administered 2016-05-18 – 2016-05-20 (×5): 80 mg via ORAL
  Filled 2016-05-18 (×5): qty 1

## 2016-05-18 MED ORDER — COCONUT OIL OIL: 1.0000 "application " | TOPICAL_OIL | Status: DC | PRN

## 2016-05-18 MED ORDER — LACTATED RINGERS IV SOLN: INTRAVENOUS | Status: DC

## 2016-05-18 MED ORDER — FENTANYL CITRATE (PF) 100 MCG/2ML IJ SOLN: 25.0000 ug | INTRAMUSCULAR | Status: DC | PRN

## 2016-05-18 MED ORDER — MORPHINE SULFATE (PF) 0.5 MG/ML IJ SOLN
INTRAMUSCULAR | Status: AC
  Filled 2016-05-18: qty 10

## 2016-05-18 MED ORDER — NALOXONE HCL 0.4 MG/ML IJ SOLN: 0.4000 mg | INTRAMUSCULAR | Status: DC | PRN

## 2016-05-18 MED ORDER — FERROUS SULFATE 325 (65 FE) MG PO TABS
325.0000 mg | ORAL_TABLET | Freq: Every day | ORAL | Status: DC
  Administered 2016-05-19 – 2016-05-20 (×2): 325 mg via ORAL
  Filled 2016-05-18 (×2): qty 1

## 2016-05-18 MED ORDER — ZOLPIDEM TARTRATE 5 MG PO TABS: 5.0000 mg | ORAL_TABLET | Freq: Every evening | ORAL | Status: DC | PRN

## 2016-05-18 MED ORDER — DIPHENHYDRAMINE HCL 25 MG PO CAPS: 25.0000 mg | ORAL_CAPSULE | Freq: Four times a day (QID) | ORAL | Status: DC | PRN

## 2016-05-18 MED ORDER — KETOROLAC TROMETHAMINE 30 MG/ML IJ SOLN
INTRAMUSCULAR | Status: AC
  Filled 2016-05-18: qty 1

## 2016-05-18 MED ORDER — FENTANYL CITRATE (PF) 100 MCG/2ML IJ SOLN
INTRAMUSCULAR | Status: DC | PRN
Start: 2016-05-18 — End: 2016-05-18
  Administered 2016-05-18: 80 ug via INTRAVENOUS

## 2016-05-18 MED ORDER — OXYTOCIN 40 UNITS IN LACTATED RINGERS INFUSION - SIMPLE MED: 2.5000 [IU]/h | INTRAVENOUS | Status: AC

## 2016-05-18 MED ORDER — DEXAMETHASONE SODIUM PHOSPHATE 4 MG/ML IJ SOLN
INTRAMUSCULAR | Status: AC
  Filled 2016-05-18: qty 1

## 2016-05-18 MED ORDER — PHENYLEPHRINE 8 MG IN D5W 100 ML (0.08MG/ML) PREMIX OPTIME
INJECTION | INTRAVENOUS | Status: DC | PRN
  Administered 2016-05-18: 60 ug/min via INTRAVENOUS

## 2016-05-18 MED ORDER — NALBUPHINE HCL 10 MG/ML IJ SOLN: 5.0000 mg | INTRAMUSCULAR | Status: DC | PRN

## 2016-05-18 MED ORDER — IBUPROFEN 600 MG PO TABS
600.0000 mg | ORAL_TABLET | Freq: Four times a day (QID) | ORAL | Status: DC
  Administered 2016-05-19 – 2016-05-20 (×6): 600 mg via ORAL
  Filled 2016-05-18 (×6): qty 1

## 2016-05-18 MED ORDER — SCOPOLAMINE 1 MG/3DAYS TD PT72
1.0000 | MEDICATED_PATCH | Freq: Once | TRANSDERMAL | Status: DC
  Administered 2016-05-18: 1.5 mg via TRANSDERMAL
  Filled 2016-05-18: qty 1

## 2016-05-18 MED ORDER — QUETIAPINE FUMARATE 200 MG PO TABS
200.0000 mg | ORAL_TABLET | Freq: Every day | ORAL | Status: DC
  Administered 2016-05-18 – 2016-05-19 (×2): 200 mg via ORAL
  Filled 2016-05-18 (×2): qty 1

## 2016-05-18 MED ORDER — OXYCODONE-ACETAMINOPHEN 5-325 MG PO TABS
1.0000 | ORAL_TABLET | ORAL | Status: DC | PRN
  Administered 2016-05-18 – 2016-05-19 (×5): 1 via ORAL
  Filled 2016-05-18 (×5): qty 1

## 2016-05-18 MED ORDER — ALPRAZOLAM 0.5 MG PO TABS
1.0000 mg | ORAL_TABLET | Freq: Every day | ORAL | Status: DC | PRN
  Administered 2016-05-18 – 2016-05-19 (×3): 1 mg via ORAL
  Filled 2016-05-18 (×3): qty 2

## 2016-05-18 MED ORDER — ONDANSETRON HCL 4 MG/2ML IJ SOLN
INTRAMUSCULAR | Status: DC | PRN
  Administered 2016-05-18: 4 mg via INTRAVENOUS

## 2016-05-18 MED ORDER — PHENYLEPHRINE 8 MG IN D5W 100 ML (0.08MG/ML) PREMIX OPTIME
INJECTION | INTRAVENOUS | Status: AC
  Filled 2016-05-18: qty 100

## 2016-05-18 MED ORDER — PNEUMOCOCCAL VAC POLYVALENT 25 MCG/0.5ML IJ INJ
0.5000 mL | INJECTION | INTRAMUSCULAR | Status: DC
  Filled 2016-05-18: qty 0.5

## 2016-05-18 MED ORDER — SIMETHICONE 80 MG PO CHEW: 80.0000 mg | CHEWABLE_TABLET | ORAL | Status: DC | PRN

## 2016-05-18 MED ORDER — FENTANYL CITRATE (PF) 100 MCG/2ML IJ SOLN
INTRAMUSCULAR | Status: AC
  Filled 2016-05-18: qty 2

## 2016-05-18 MED ORDER — ERYTHROMYCIN 5 MG/GM OP OINT
TOPICAL_OINTMENT | OPHTHALMIC | Status: AC
  Filled 2016-05-18: qty 1

## 2016-05-18 MED ORDER — ESCITALOPRAM OXALATE 20 MG PO TABS
20.0000 mg | ORAL_TABLET | Freq: Every day | ORAL | Status: DC
  Filled 2016-05-18 (×3): qty 1

## 2016-05-18 MED ORDER — LACTATED RINGERS IV SOLN
INTRAVENOUS | Status: DC
  Administered 2016-05-18: 13:00:00 via INTRAVENOUS

## 2016-05-18 MED ORDER — IRON 325 (65 FE) MG PO TABS: ORAL_TABLET | Freq: Every day | ORAL | Status: DC

## 2016-05-18 MED ORDER — PRENATAL MULTIVITAMIN CH
1.0000 | ORAL_TABLET | Freq: Every day | ORAL | Status: DC
  Administered 2016-05-19: 1 via ORAL
  Filled 2016-05-18: qty 1

## 2016-05-18 MED ORDER — NALOXONE HCL 2 MG/2ML IJ SOSY
1.0000 ug/kg/h | PREFILLED_SYRINGE | INTRAMUSCULAR | Status: DC | PRN
  Filled 2016-05-18: qty 2

## 2016-05-18 MED ORDER — OXYTOCIN 10 UNIT/ML IJ SOLN
INTRAMUSCULAR | Status: AC
  Filled 2016-05-18: qty 4

## 2016-05-18 MED ORDER — SENNOSIDES-DOCUSATE SODIUM 8.6-50 MG PO TABS
2.0000 | ORAL_TABLET | ORAL | Status: DC
  Administered 2016-05-19 (×2): 2 via ORAL
  Filled 2016-05-18 (×2): qty 2

## 2016-05-18 MED ORDER — TETANUS-DIPHTH-ACELL PERTUSSIS 5-2.5-18.5 LF-MCG/0.5 IM SUSP: 0.5000 mL | Freq: Once | INTRAMUSCULAR | Status: DC

## 2016-05-18 MED ORDER — ACETAMINOPHEN 325 MG PO TABS: 650.0000 mg | ORAL_TABLET | ORAL | Status: DC | PRN

## 2016-05-18 MED ORDER — DIPHENHYDRAMINE HCL 50 MG/ML IJ SOLN
12.5000 mg | INTRAMUSCULAR | Status: DC | PRN
  Administered 2016-05-18 (×2): 12.5 mg via INTRAVENOUS
  Filled 2016-05-18 (×2): qty 1

## 2016-05-18 MED ORDER — NALBUPHINE HCL 10 MG/ML IJ SOLN
5.0000 mg | INTRAMUSCULAR | Status: DC | PRN
  Administered 2016-05-18 – 2016-05-19 (×3): 5 mg via INTRAVENOUS
  Filled 2016-05-18 (×3): qty 1

## 2016-05-18 MED ORDER — SODIUM CHLORIDE 0.9% FLUSH
3.0000 mL | INTRAVENOUS | Status: DC | PRN
Start: 2016-05-18 — End: 2016-05-20

## 2016-05-18 MED ORDER — ONDANSETRON HCL 4 MG/2ML IJ SOLN
INTRAMUSCULAR | Status: AC
  Filled 2016-05-18: qty 2

## 2016-05-18 MED ORDER — MEPERIDINE HCL 25 MG/ML IJ SOLN: 6.2500 mg | INTRAMUSCULAR | Status: DC | PRN

## 2016-05-18 MED ORDER — SIMETHICONE 80 MG PO CHEW
80.0000 mg | CHEWABLE_TABLET | ORAL | Status: DC
  Administered 2016-05-19 (×2): 80 mg via ORAL
  Filled 2016-05-18 (×2): qty 1

## 2016-05-18 MED ORDER — OXYTOCIN 10 UNIT/ML IJ SOLN
INTRAVENOUS | Status: DC | PRN
  Administered 2016-05-18: 40 [IU] via INTRAVENOUS

## 2016-05-18 MED ORDER — SOD CITRATE-CITRIC ACID 500-334 MG/5ML PO SOLN
30.0000 mL | Freq: Once | ORAL | Status: AC
  Administered 2016-05-18: 30 mL via ORAL
  Filled 2016-05-18: qty 15

## 2016-05-18 MED ORDER — WITCH HAZEL-GLYCERIN EX PADS: 1.0000 "application " | MEDICATED_PAD | CUTANEOUS | Status: DC | PRN

## 2016-05-18 MED ORDER — CEFAZOLIN SODIUM-DEXTROSE 2-4 GM/100ML-% IV SOLN
2.0000 g | INTRAVENOUS | Status: DC
  Filled 2016-05-18: qty 100

## 2016-05-18 MED ORDER — DIBUCAINE 1 % RE OINT: 1.0000 "application " | TOPICAL_OINTMENT | RECTAL | Status: DC | PRN

## 2016-05-18 MED ORDER — OXYCODONE-ACETAMINOPHEN 5-325 MG PO TABS
2.0000 | ORAL_TABLET | ORAL | Status: DC | PRN
  Administered 2016-05-19 – 2016-05-20 (×6): 2 via ORAL
  Filled 2016-05-18 (×6): qty 2

## 2016-05-18 MED ORDER — METOCLOPRAMIDE HCL 5 MG/ML IJ SOLN: 10.0000 mg | Freq: Once | INTRAMUSCULAR | Status: DC | PRN

## 2016-05-18 SURGICAL SUPPLY — 32 items
BENZOIN TINCTURE PRP APPL 2/3 (GAUZE/BANDAGES/DRESSINGS) ×3 IMPLANT
CHLORAPREP W/TINT 26ML (MISCELLANEOUS) ×3 IMPLANT
CLAMP CORD UMBIL (MISCELLANEOUS) IMPLANT
CLOSURE WOUND 1/2 X4 (GAUZE/BANDAGES/DRESSINGS)
CLOTH BEACON ORANGE TIMEOUT ST (SAFETY) ×3 IMPLANT
DERMABOND ADVANCED (GAUZE/BANDAGES/DRESSINGS)
DERMABOND ADVANCED .7 DNX12 (GAUZE/BANDAGES/DRESSINGS) IMPLANT
DRSG OPSITE POSTOP 4X10 (GAUZE/BANDAGES/DRESSINGS) ×3 IMPLANT
ELECT REM PT RETURN 9FT ADLT (ELECTROSURGICAL) ×3
ELECTRODE REM PT RTRN 9FT ADLT (ELECTROSURGICAL) ×1 IMPLANT
EXTRACTOR VACUUM KIWI (MISCELLANEOUS) ×3 IMPLANT
GLOVE BIO SURGEON STRL SZ 6 (GLOVE) ×3 IMPLANT
GLOVE BIOGEL PI IND STRL 6 (GLOVE) ×2 IMPLANT
GLOVE BIOGEL PI IND STRL 7.0 (GLOVE) ×1 IMPLANT
GLOVE BIOGEL PI INDICATOR 6 (GLOVE) ×4
GLOVE BIOGEL PI INDICATOR 7.0 (GLOVE) ×2
GOWN STRL REUS W/TWL LRG LVL3 (GOWN DISPOSABLE) ×6 IMPLANT
KIT ABG SYR 3ML LUER SLIP (SYRINGE) ×3 IMPLANT
NEEDLE HYPO 25X5/8 SAFETYGLIDE (NEEDLE) ×3 IMPLANT
NS IRRIG 1000ML POUR BTL (IV SOLUTION) ×3 IMPLANT
PACK C SECTION WH (CUSTOM PROCEDURE TRAY) ×3 IMPLANT
PAD OB MATERNITY 4.3X12.25 (PERSONAL CARE ITEMS) ×3 IMPLANT
PENCIL SMOKE EVAC W/HOLSTER (ELECTROSURGICAL) ×3 IMPLANT
STRIP CLOSURE SKIN 1/2X4 (GAUZE/BANDAGES/DRESSINGS) IMPLANT
SUT CHROMIC 0 CTX 36 (SUTURE) ×9 IMPLANT
SUT MON AB 2-0 CT1 27 (SUTURE) ×3 IMPLANT
SUT PDS AB 0 CT1 27 (SUTURE) ×3 IMPLANT
SUT PLAIN 0 NONE (SUTURE) IMPLANT
SUT VIC AB 0 CT1 36 (SUTURE) IMPLANT
SUT VIC AB 4-0 KS 27 (SUTURE) ×3 IMPLANT
TOWEL OR 17X24 6PK STRL BLUE (TOWEL DISPOSABLE) ×3 IMPLANT
TRAY FOLEY CATH SILVER 14FR (SET/KITS/TRAYS/PACK) IMPLANT

## 2016-05-18 NOTE — Transfer of Care (Signed)
Immediate Anesthesia Transfer of Care Note  Patient: Allison Richard  Procedure(s) Performed: Procedure(s): CESAREAN SECTION (N/A)  Patient Location: PACU  Anesthesia Type:Spinal  Level of Consciousness: awake, alert  and oriented  Airway & Oxygen Therapy: Patient Spontanous Breathing  Post-op Assessment: Report given to RN and Post -op Vital signs reviewed and stable  Post vital signs: Reviewed and stable  Last Vitals:  Vitals:   05/18/16 1128  BP: 128/87  Pulse: (!) 108  Temp: 37.1 C    Last Pain:  Vitals:   05/18/16 1128  TempSrc: Oral      Patients Stated Pain Goal: 2 (05/18/16 1128)  Complications: No apparent anesthesia complications

## 2016-05-18 NOTE — Progress Notes (Signed)
No change to H&P.  Allison Kerrick, dO 

## 2016-05-18 NOTE — Anesthesia Preprocedure Evaluation (Addendum)
Anesthesia Evaluation  Patient identified by MRN, date of birth, ID band Patient awake    Reviewed: Allergy & Precautions, NPO status , Patient's Chart, lab work & pertinent test results  Airway Mallampati: II  TM Distance: >3 FB Neck ROM: Full    Dental no notable dental hx.    Pulmonary neg pulmonary ROS, former smoker,    Pulmonary exam normal breath sounds clear to auscultation       Cardiovascular negative cardio ROS Normal cardiovascular exam Rhythm:Regular Rate:Normal     Neuro/Psych Seizures -,  Anxiety Depression Bipolar Disorder negative psych ROS   GI/Hepatic negative GI ROS, (+)     substance abuse  IV drug use, Hepatitis -, C  Endo/Other  negative endocrine ROS  Renal/GU negative Renal ROS  negative genitourinary   Musculoskeletal negative musculoskeletal ROS (+)   Abdominal   Peds negative pediatric ROS (+)  Hematology negative hematology ROS (+)   Anesthesia Other Findings   Reproductive/Obstetrics (+) Pregnancy                            Anesthesia Physical Anesthesia Plan  ASA: III  Anesthesia Plan: Spinal   Post-op Pain Management:    Induction:   Airway Management Planned: Natural Airway  Additional Equipment:   Intra-op Plan:   Post-operative Plan:   Informed Consent: I have reviewed the patients History and Physical, chart, labs and discussed the procedure including the risks, benefits and alternatives for the proposed anesthesia with the patient or authorized representative who has indicated his/her understanding and acceptance.   Dental advisory given  Plan Discussed with: CRNA  Anesthesia Plan Comments:         Anesthesia Quick Evaluation

## 2016-05-18 NOTE — Op Note (Signed)
Allison MackintoshKatherine L Tiede PROCEDURE DATE: 05/18/2016  PREOPERATIVE DIAGNOSIS: Intrauterine pregnancy at  6362w0d weeks gestation, previous C/S, Hepatitis C, Chronic Xanax use in pregnancy  POSTOPERATIVE DIAGNOSIS: The same  PROCEDURE:  Repeat Low Transverse Cesarean Section  SURGEON:  Dr. Mitchel HonourMegan Abrina Petz   INDICATIONS: Allison MackintoshKatherine L Dargan is a 31 y.o. G3P1011 at 2262w0d scheduled for cesarean section secondary to desire for repeat.  The risks of cesarean section discussed with the patient included but were not limited to: bleeding which may require transfusion or reoperation; infection which may require antibiotics; injury to bowel, bladder, ureters or other surrounding organs; injury to the fetus; need for additional procedures including hysterectomy in the event of a life-threatening hemorrhage; placental abnormalities wth subsequent pregnancies, incisional problems, thromboembolic phenomenon and other postoperative/anesthesia complications. The patient concurred with the proposed plan, giving informed written consent for the procedure.    FINDINGS:  Viable female infant in cephalic presentation, APGARs 8,8:  Weight pending  Clear amniotic fluid.  Intact placenta, three vessel cord.  Grossly normal uterus, ovaries and fallopian tubes. .   ANESTHESIA:  Spinal ESTIMATED BLOOD LOSS: 800 ml SPECIMENS: Placenta sent to L&D COMPLICATIONS: None immediate  PROCEDURE IN DETAIL:  The patient received intravenous antibiotics and had sequential compression devices applied to her lower extremities while in the preoperative area.  She was then taken to the operating room where spinal anesthesia was administered and was found to be adequate. She was then placed in a dorsal supine position with a leftward tilt, and prepped and draped in a sterile manner.  A foley catheter was placed into her bladder and attached to constant gravity.  After an adequate timeout was performed, a Pfannenstiel skin incision was made with scalpel  and carried through to the underlying layer of fascia. The fascia was incised in the midline and this incision was extended bilaterally using the Mayo scissors. Kocher clamps were applied to the superior aspect of the fascial incision and the underlying rectus muscles were dissected off bluntly. A similar process was carried out on the inferior aspect of the facial incision. The rectus muscles were separated in the midline bluntly and the peritoneum was entered bluntly. Bladder flap was created sharply and developed bluntly.  Bladder blade was placed.  A transverse hysterotomy was made with a scalpel and extended bilaterally bluntly. The bladder blade was then removed. The infant was successfully delivered using a single Kiwi vacuum pull, and cord was clamped and cut and infant was handed over to awaiting neonatology team. Uterine massage was then administered and the placenta delivered intact with three-vessel cord. The uterus was cleared of clot and debris.  The hysterotomy was closed with 0 chromic.  A second imbricating suture of 0-chromic was used to reinforce the incision and aid in hemostasis.  The peritoneum and rectus muscles were noted to be hemostatic and were reapproximated using 2-0 monocryl in a running fashion.  The fascia was closed with 0-PDS in a running fashion with good restoration of anatomy.  The subcutaneus tissue was copiously irrigated.  The skin was closed with 4-0 vicryl in a subcuticular fashion.  Pt tolerated the procedure will.  All counts were correct x2.  Pt went to the recovery room in stable condition.

## 2016-05-18 NOTE — Lactation Note (Signed)
This note was copied from a baby's chart. Lactation Consultation Note  Patient Name: Allison Mancel ParsonsKatherine Hollenbach ZOXWR'UToday's Date: 05/18/2016 Reason for consult: Initial assessment   Initial assessment with mom in PACU. Mom reports she BF her son for a short time. She had to use a NS and stopped BF and pumped for 2 months. She reports she had inverted nipples with her 29 yo, nipples are now everted.   Infant STS with mom and cueing to feed. Showed mom how to hand express, colostrum visible from both breasts. Mom with large compressible breasts and areola with small everted nipples. Latched infant to right breast in the laid back cross cradle hold. Tea cup hold was needed to get infant to latch. Showed mom and dad how to use tea cup hold to latch infant. Enc parents to call out for feeding assistance as needed.[place]. Enc mom to feed infant STS 8-12 x in 24 hours at first feeding cues. Enc mom to use pillow and head support with feedings. Enc mom to massage/compress breast with feedings to maximize milk transfer.   BF basics, STS, pillow support, head support, colostrum, milk coming to volume, infant stomach size, hand expression, positioning, and spoon feeding reviewed with parents. Feeding log given with instructions for use. BF Resources Handout and LC Brochure given, mom informed of IP/OP Services, BF Support Groups and LC phone #. [facility] and is aware to call and make appt post d/c. She does not have a pump at home.   Mom asked about BF and Hepatitis C. Discussed that BF is ok as long as there is no blood coming from nipple or breast, mom voiced understanding. Discussed that if there is blood in milk that is pumped it also should not be used for infant. Mom taking Xanax daily (L3-Probably Compatible per Bobbye Mortonhomas Hale), Seroquel (L2-Probably compatible per Bobbye Mortonhomas Hale) and Lexapro (L2-Probably compatible per Bobbye Mortonhomas Hale)   Maternal Data Formula Feeding for Exclusion: No Has patient been taught Hand  Expression?: Yes Does the patient have breastfeeding experience prior to this delivery?: Yes  Feeding Feeding Type: Breast Fed Length of feed: 15 min  LATCH Score/Interventions Latch: Grasps breast easily, tongue down, lips flanged, rhythmical sucking.  Audible Swallowing: A few with stimulation Intervention(s): Skin to skin;Hand expression;Alternate breast massage  Type of Nipple: Everted at rest and after stimulation  Comfort (Breast/Nipple): Soft / non-tender     Hold (Positioning): Assistance needed to correctly position infant at breast and maintain latch. Intervention(s): Breastfeeding basics reviewed;Support Pillows;Position options;Skin to skin  LATCH Score: 8  Lactation Tools Discussed/Used WIC Program: Yes   Consult Status Consult Status: Follow-up Date: 05/19/16 Follow-up type: In-patient    Silas FloodSharon S Hice 05/18/2016, 2:46 PM

## 2016-05-19 ENCOUNTER — Encounter (HOSPITAL_COMMUNITY): Payer: Self-pay | Admitting: Obstetrics & Gynecology

## 2016-05-19 LAB — CBC
HCT: 27.4 % — ABNORMAL LOW (ref 36.0–46.0)
HEMOGLOBIN: 8.8 g/dL — AB (ref 12.0–15.0)
MCH: 24.8 pg — ABNORMAL LOW (ref 26.0–34.0)
MCHC: 32.1 g/dL (ref 30.0–36.0)
MCV: 77.2 fL — ABNORMAL LOW (ref 78.0–100.0)
PLATELETS: 143 10*3/uL — AB (ref 150–400)
RBC: 3.55 MIL/uL — ABNORMAL LOW (ref 3.87–5.11)
RDW: 20.9 % — ABNORMAL HIGH (ref 11.5–15.5)
WBC: 10.3 10*3/uL (ref 4.0–10.5)

## 2016-05-19 MED ORDER — HYDROCORTISONE 1 % EX OINT: TOPICAL_OINTMENT | CUTANEOUS | Status: DC

## 2016-05-19 MED ORDER — HYDROCORTISONE 1 % EX CREA
TOPICAL_CREAM | Freq: Three times a day (TID) | CUTANEOUS | Status: DC
  Administered 2016-05-19: 22:00:00 via TOPICAL
  Filled 2016-05-19: qty 28

## 2016-05-19 NOTE — Lactation Note (Signed)
This note was copied from a baby's chart. Lactation Consultation Note  Patient Name: Allison Mancel ParsonsKatherine Boothe EXBMW'UToday's Date: 05/19/2016 Reason for consult: Follow-up assessment;Difficult latch  Parents called for assist with latch, baby very sleepy was circumcised this afternoon. Mom BF her 29 year old for short time and had to use nipple shield. Mom has flat nipples but they are very compressible. In football hold using breast compression baby was able to latch without nipple shield but fell asleep after about 5 minutes at the breast. Tried nipple shield but baby sleepy and only took few suckles. Hand pump given to Mom and advised to pre-pump to help with latch, breast shells given to wear. Advised parents to call with next feeding for assist when baby is more awake. Parents have been giving bottles since yesterday and using pacifier. Reviewed risk of early pacifier use to BF success. Encouraged to call before giving baby any formula so baby will work with us to latch at breast. Parents have LC phone number to call.  Maternal Data    Feeding Feeding Type: Breast Fed Length of feed: 3 min  LATCH Score/Interventions Latch: Repeated attempts needed to sustain latch, nipple held in mouth throughout feeding, stimulation needed to elicit sucking reflex. Intervention(s): Assist with latch  Audible Swallowing: None Intervention(s): Hand expression;Skin to skin  Type of Nipple: Flat Intervention(s): Shells;Hand pump  Comfort (Breast/Nipple): Soft / non-tender     Hold (Positioning): Assistance needed to correctly position infant at breast and maintain latch. Intervention(s): Support Pillows;Position options;Skin to skin  LATCH Score: 5  Lactation Tools Discussed/Used Tools: Shells;Nipple Dorris CarnesShields;Pump Nipple shield size: 20;16 Shell Type: Inverted Breast pump type: Manual   Consult Status Consult Status: Follow-up Date: 05/19/16 Follow-up type: In-patient    Alfred LevinsGranger, Mariamawit Depaoli  Ann 05/19/2016, 6:23 PM

## 2016-05-19 NOTE — Anesthesia Postprocedure Evaluation (Signed)
Anesthesia Post Note  Patient: Allison Richard  Procedure(s) Performed: Procedure(s) (LRB): CESAREAN SECTION (N/A)  Patient location during evaluation: Mother Baby Anesthesia Type: Spinal Level of consciousness: awake and alert, oriented and patient cooperative Pain management: pain level controlled Vital Signs Assessment: post-procedure vital signs reviewed and stable Respiratory status: spontaneous breathing Cardiovascular status: stable Postop Assessment: no headache, patient able to bend at knees, no signs of nausea or vomiting and spinal receding Anesthetic complications: no Comments: Pain score 6...bedside RN notified of need for pain med.        Last Vitals:  Vitals:   05/19/16 0234 05/19/16 0610  BP: (!) 107/59 116/64  Pulse: 80 84  Resp: 18 18  Temp: 36.7 C 36.8 C    Last Pain:  Vitals:   05/19/16 0823  TempSrc:   PainSc: 8    Pain Goal: Patients Stated Pain Goal: 1 (05/19/16 0600)               Merrilyn PumaWRINKLE,[name]

## 2016-05-19 NOTE — Lactation Note (Signed)
This note was copied from a baby's chart. Lactation Consultation Note  Patient Name: Allison Mancel ParsonsKatherine Mirsky [id number]'s Date: 05/19/2016 Reason for consult: Follow-up assessment;Difficult latch Parents called for assist with latch. Baby sleepy once LC arrived, attempted to latch without nipple shield but baby could not obtain/sustain any depth. Applied 20 nipple shield, demonstrated how to pre-load with EBM/formula to help with latch. Baby took few suckles then came off the breast. Set up DEBP for Mom to start pumping every 3 hours for 15 minutes to encourage milk production and to have EBM to supplement. Baby started giving feeding ques and parent were able to latch baby using 20 nipple shield. Advised parents to look for colostrum in nipple shield with feedings. Advised baby should be at breast 8-12 times in 24 hours and with feeding ques, Try to keep baby nursing for 15-30 minutes both breasts when possible. Supplement as needed to satisfy baby. Ask for assist as needed. [facility] May need [facility], did not discuss yet with parent.   Maternal Data    Feeding Feeding Type: Breast Fed Length of feed: 3 min  LATCH Score/Interventions Latch: Repeated attempts needed to sustain latch, nipple held in mouth throughout feeding, stimulation needed to elicit sucking reflex. (with 20 nipple shield) Intervention(s): Adjust position;Assist with latch;Breast massage;Breast compression  Audible Swallowing: None Intervention(s): Hand expression;Skin to skin  Type of Nipple: Flat Intervention(s): Shells;Hand pump  Comfort (Breast/Nipple): Soft / non-tender     Hold (Positioning): Assistance needed to correctly position infant at breast and maintain latch. Intervention(s): Support Pillows;Position options;Skin to skin  LATCH Score: 5  Lactation Tools Discussed/Used Tools: Shells;Nipple Dorris CarnesShields;Pump Nipple shield size: 16;20 Shell Type: Inverted Breast pump type: Double-Electric  Breast Pump   Consult Status Consult Status: Follow-up Date: 05/20/16 Follow-up type: In-patient    Alfred LevinsGranger, Dontrez Pettis Ann 05/19/2016, 8:29 PM

## 2016-05-19 NOTE — Anesthesia Postprocedure Evaluation (Deleted)
Anesthesia Post Note  Patient: Margaree MackintoshKatherine L Gehring  Procedure(s) Performed: Procedure(s) (LRB): CESAREAN SECTION (N/A)  Patient location during evaluation: Mother Baby Anesthesia Type: Spinal Level of consciousness: awake and alert, oriented and patient cooperative Pain management: pain level controlled Vital Signs Assessment: post-procedure vital signs reviewed and stable Respiratory status: spontaneous breathing Cardiovascular status: stable Postop Assessment: no headache, epidural receding, patient able to bend at knees and no signs of nausea or vomiting Anesthetic complications: no Comments: Pain Score 6...bedside RN notified of need for pain med.        Last Vitals:  Vitals:   05/19/16 0234 05/19/16 0610  BP: (!) 107/59 116/64  Pulse: 80 84  Resp: 18 18  Temp: 36.7 C 36.8 C    Last Pain:  Vitals:   05/19/16 0823  TempSrc:   PainSc: 8    Pain Goal: Patients Stated Pain Goal: 1 (05/19/16 0600)               Merrilyn PumaWRINKLE,Kamoni Depree

## 2016-05-19 NOTE — Progress Notes (Signed)
Subjective: Postpartum Day 1: Cesarean Delivery Patient reports tolerating PO and no problems voiding.    Objective: Vital signs in last 24 hours: Temp:  [98 F (36.7 C)-99.1 F (37.3 C)] 98.2 F (36.8 C) (02/23 0610) Pulse Rate:  [80-113] 84 (02/23 0610) Resp:  [16-23] 18 (02/23 0610) BP: (104-128)/(47-87) 116/64 (02/23 0610) SpO2:  [95 %-99 %] 98 % (02/23 0610) Weight:  [213 lb (96.6 kg)] 213 lb (96.6 kg) (02/22 1128)  Physical Exam:  General: alert and cooperative Lochia: appropriate Uterine Fundus: firm Incision: healing well DVT Evaluation: No evidence of DVT seen on physical exam. Negative Homan's sign. No cords or calf tenderness. Calf/Ankle edema is present.   Recent Labs  05/17/16 1210 05/19/16 0516  HGB 10.4* 8.8*  HCT 32.6* 27.4*    Assessment/Plan: Status post Cesarean section. Doing well postoperatively.  Continue current care.  Brittan Mapel G 05/19/2016, 8:04 AM

## 2016-05-20 MED ORDER — OXYCODONE-ACETAMINOPHEN 5-325 MG PO TABS: 2.0000 | ORAL_TABLET | ORAL | 0 refills | Status: DC | PRN

## 2016-05-20 MED ORDER — IBUPROFEN 600 MG PO TABS: 600.0000 mg | ORAL_TABLET | Freq: Four times a day (QID) | ORAL | 0 refills | Status: DC

## 2016-05-20 NOTE — Discharge Summary (Signed)
Obstetric Discharge Summary Reason for Admission: cesarean section Prenatal Procedures: none Intrapartum Procedures: cesarean: low cervical, transverse Postpartum Procedures: none Complications-Operative and Postpartum: none Hemoglobin  Date Value Ref Range Status  05/19/2016 8.8 (L) 12.0 - 15.0 g/dL Final   HCT  Date Value Ref Range Status  05/19/2016 27.4 (L) 36.0 - 46.0 % Final    Physical Exam:  General: alert, cooperative and appears stated age 3Lochia: appropriate Uterine Fundus: firm Incision: healing well, no significant drainage, no dehiscence DVT Evaluation: No evidence of DVT seen on physical exam.  Discharge Diagnoses: Term Pregnancy-delivered  Discharge Information: Date: 05/20/2016 Activity: pelvic rest Diet: routine Medications: Ibuprofen and Percocet Condition: improved Instructions: refer to practice specific booklet Discharge to: home   Newborn Data: Live born female  Birth Weight: 9 lb 11 oz (4395 g) APGAR: 8, 8  Home with mother.  Jareb Radoncic L 05/20/2016, 7:20 AM

## 2016-05-20 NOTE — Clinical Social Work Maternal (Signed)
CLINICAL SOCIAL WORK MATERNAL/CHILD NOTE  Patient Details  Name: Allison Richard MRN: [id number] Date of Birth: [birth year]  Date:  05/20/2016  Clinical Social Worker Initiating Note:  Laurey Arrow Date/ Time Initiated:  05/19/16/1300     Child's Name:  Roxy Manns Long   Legal Guardian:  Mother (FOB is Valerie Roys 01/06/2017)   Need for Interpreter:  None   Date of Referral:  05/18/16     Reason for Referral:  Behavioral Health Issues, including SI  (hx of substance use. )   Referral Source:  Central Nursery   Address:  [address] [place] , [address]  Phone number:  [phone]   Household Members:  Self, Spouse   Natural Supports (not living in the home):  Immediate Family, Artist Supports: None   Employment: Unemployed   Type of Work:     Education:  Database administrator Resources:  Multimedia programmer   Other Resources:      Cultural/Religious Considerations Which May Impact Care:  Per [employer] Sheet, MOB is Non-Denominational.  Strengths:  Ability to meet basic needs , Home prepared for child , Understanding of illness   Risk Factors/Current Problems:  Mental Health Concerns    Cognitive State:  Alert , Able to Concentrate , Insightful , Linear Thinking    Mood/Affect:  Relaxed , Interested , Comfortable , Happy , Bright    CSW Assessment: CSW met with MOB to complete an assessment for hx of substance [place] and MH hx.   When CSW arrived, MOB was resting in bed, FOB was watching TV on the couch, and infant was in the nursery getting circumcised. MOB gave CSW permission to complete the assessment while FOB was present.  MOB was polite, receptive, and appeared interested in meeting with CSW. CSW inquired about MOB's supports and MOB communicated that MOB will be supported by MOB's parents and grandparents.  MOB stated that MOB and FOB are excited about parenting and feels prepared to care for infant. CSW  inquired about MOB's MH hx and MOB acknowledged a dx of bipolar disorder. MOB reports being on a medication regiment that consist of Lexapro, Seroquel, and Xanax.  MOB reports a decrease in signs and symptoms while taking medications routinely. CSW praised MOB for being consistent with medication and educated MOB about PPD. CSW informed MOB of possible supports and interventions to decrease PPD.  CSW also encouraged MOB to seek medical attention if needed for increased signs and symptoms for PPD.  MOB acknowledged PPD with MOB's oldest son (Will Robinette 04/02/2007). MOB stated that MOB symptoms were severe and MOB transferred custody to MOB's parents.  MOB stated that MOB was unable to care for her son, was tearful most days, irritated, and often experienced SI. MOB denied SI and HI and attributed MOB past symptoms to being young and in an unhealthy relationship.  MOB denied having CPS involved.  CSW offered MOB resources for outpatient treatment and MOB declined.   CSW inquired about MOB's SA hx an MOB reported beginning her sobriety over 2 years ago.  MOB denied the use of any illegal substance in over 2 years. CSW praised MOB's for her sobriety and review the hospitals policy and procedure.  MOB was not concerned and communicated infant's UDS and CDS should be negative with the exception of Xanax.  CSW made MOB aware that if infant's results are positive without an explanation that CSW will make a report to CPS;  MOB was understanding. CSW thanked MOB for meeting with CSW and provided MOB with CSW contact information.  MOB did not ha no additional questions at this time.  CSW Plan/Description:  Information/Referral to [employer] , [employer] , No Further Intervention Required/No Barriers to Discharge ( CSW will monitor infant's CDS and will make a report if warranted. )   Laurey Arrow, MSW, LCSW Clinical Social Work [phone]  Dimple Nanas, LCSW 05/20/2016,  12:22 PM

## 2016-05-20 NOTE — Lactation Note (Signed)
This note was copied from a baby's chart. Lactation Consultation Note  Baby 8444 hours old.  Mother is mostly formula feeding. States she will breastfeed more once her milk "comes in". She states she has formula volume guidelines. Reviewed engorgement care and monitoring voids/stools. Mom encouraged to feed baby 8-12 times/24 hours and with feeding cues.  Denies questions or concerns.  Patient Name: Allison Richard ParsonsKatherine Whitner [id number]'[id number]'s Date: 05/20/2016 Reason for consult: Follow-up assessment   Maternal Data    Feeding Nipple Type: Slow - flow  LATCH Score/Interventions                      Lactation Tools Discussed/Used     Consult Status Consult Status: Complete    Hardie PulleyBerkelhammer, Mathew Storck Boschen 05/20/2016, 10:15 AM

## 2016-08-17 ENCOUNTER — Telehealth: Payer: Self-pay | Admitting: Physician Assistant

## 2016-08-17 NOTE — Telephone Encounter (Signed)
PATIENT WOULD LIKE THIS MESSAGE TO GO TO [facility]: PATIENT STATES WHEN DR. Merla RichesOLITTLE RETIRED HE REFERRED HER TO [facility]. SHE SAID WHEN SHE BECAME PREGNANT HER MEDICATIONS WERE TAKEN OVER BY HER OBGYN. SHE HAD HER BABY A FEW MONTHS AGO. SHE HAS SINCE MOVED TO [place]. SHE SAID [place] AND THEY DIDN'T WANT TO HELP HER AT ALL. SHE WANTS TO KNOW IF [facility] WOULD BE WILLING TO WRITE HER PRESCRIPTIONS FOR AT LEAST A MONTH? SHE TAKES SEROQUEL 200 MG, ALPRAZOLAM 1 MG AND LEXAPRO 20 MG. PHARMACY CHOICE IS [facility] ON [address] IN [place], [place]. [facility]

## 2016-08-18 NOTE — Telephone Encounter (Signed)
I have never seen the patient before so I am unable to do this for her at this time and she has not been seen in out office in over a year.  I would suggest she call her old OB/GYN to see if she is willing to bridge the gap until she can find someone in [place] to do this for her.  Ruiztyson -- ? New last name

## 2016-08-22 NOTE — Telephone Encounter (Signed)
L/m with with [facility] note

## 2016-09-05 IMAGING — US US OB TRANSVAGINAL
1 series · 15 of 28 positions shown · non-contrast
Comparison: Pelvic ultrasound September 15, 2015

CLINICAL DATA: Pelvic pain in first trimester. Gestational age by
last menstrual period 7 weeks and 5 days. Beta HCG 789. History of
heroin abuse.

EXAM:
TRANSVAGINAL OB ULTRASOUND
TECHNIQUE: Transvaginal ultrasound was performed for complete evaluation of the
gestation as well as the maternal uterus, adnexal regions, and
pelvic cul-de-sac.

[Series 1: us ob transvaginal · 15 of 36 slices shown]
[im 1/36]
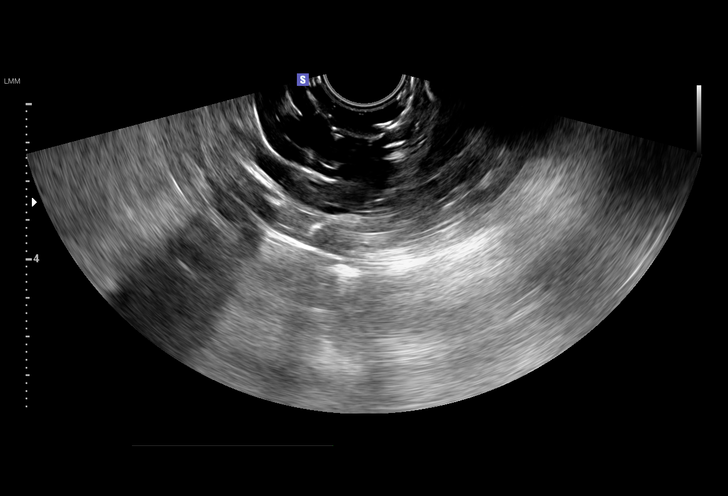
[im 3/36]
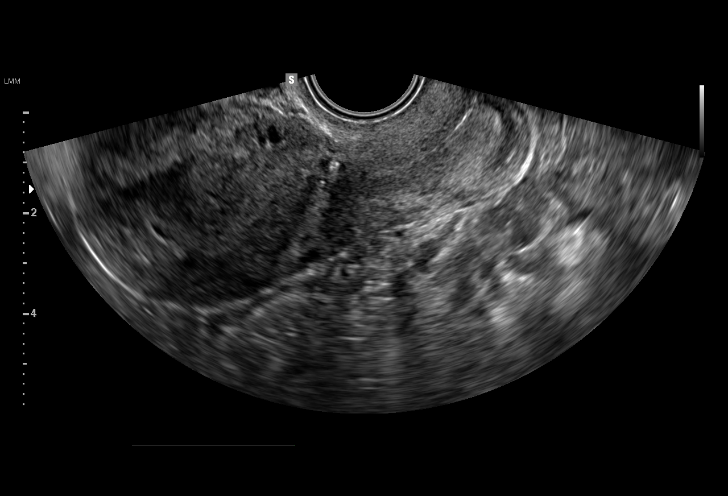
[im 6/36]
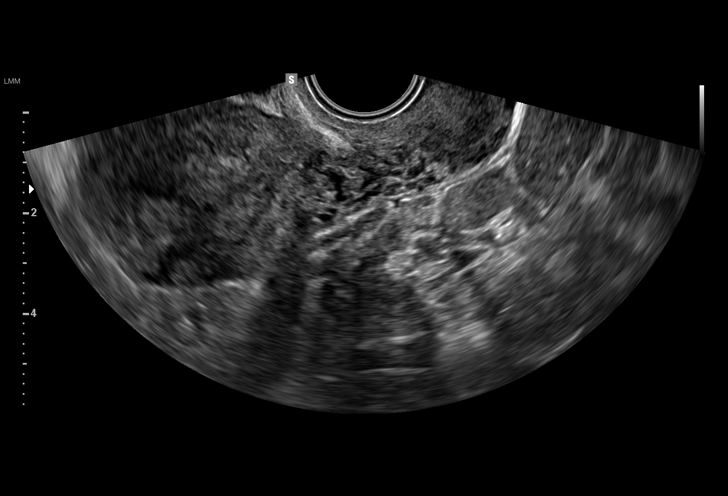
[im 8/36]
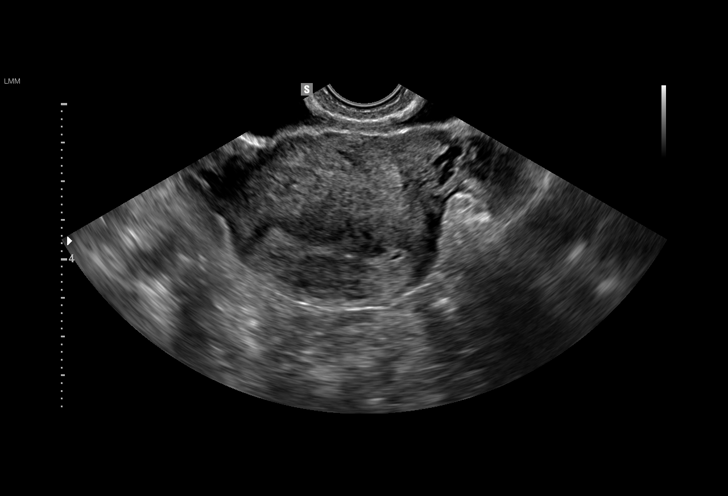
[im 11/36]
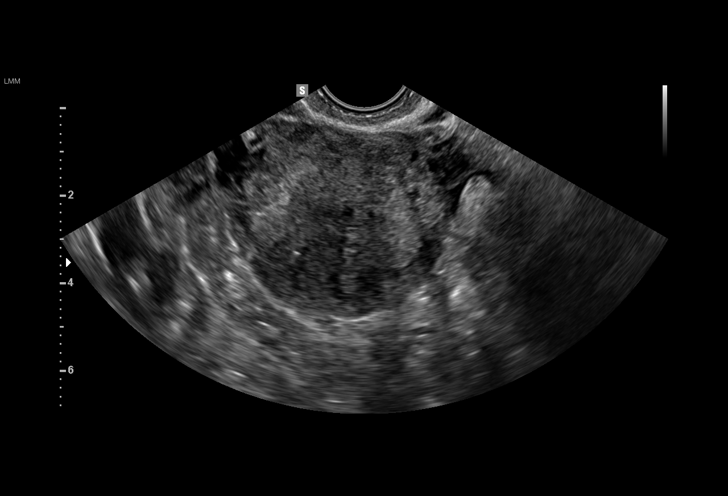
[im 13/36]
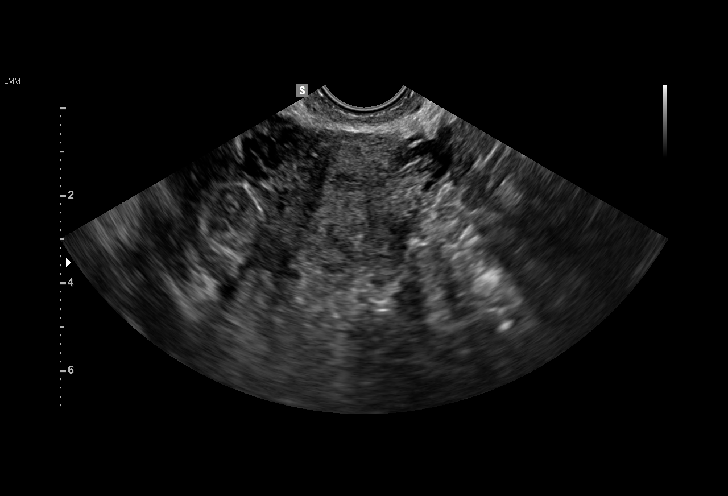
[im 16/36]
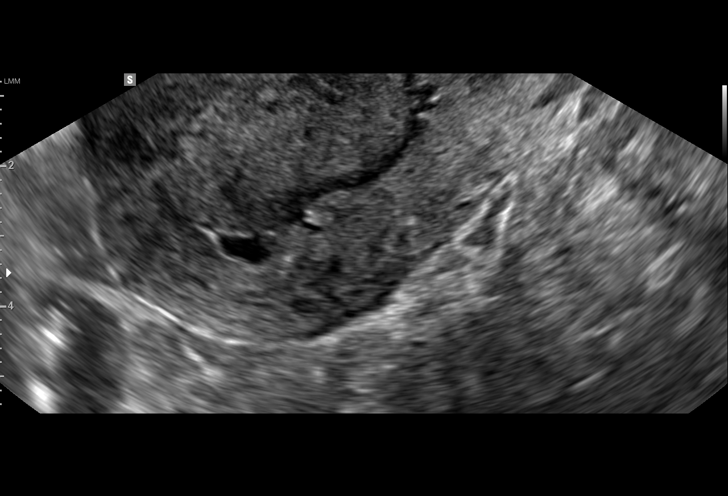
[im 19/36]
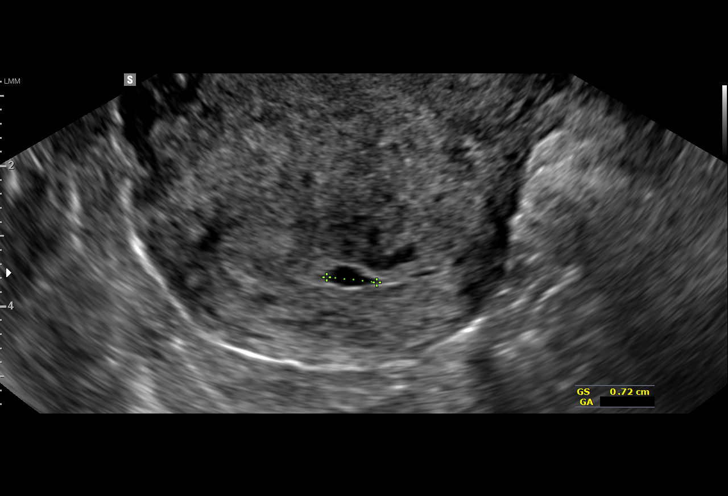
[im 20/36]
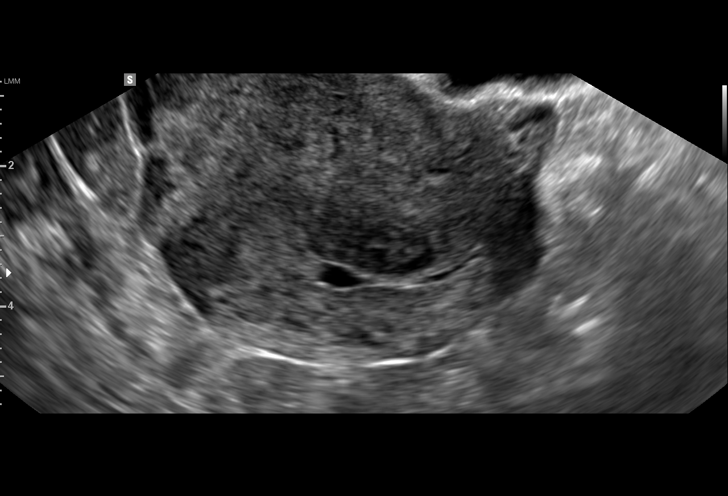
[im 23/36]
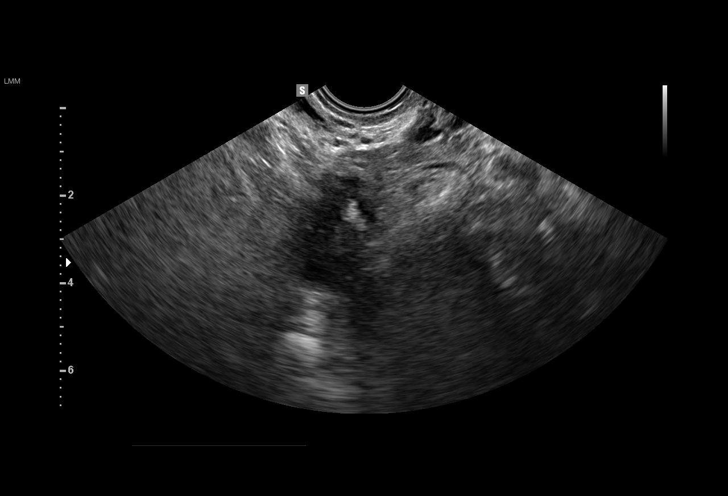
[im 25/36]
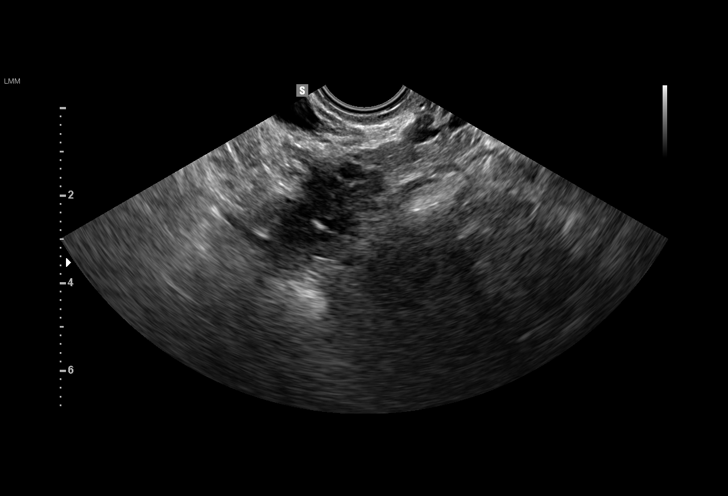
[im 28/36]
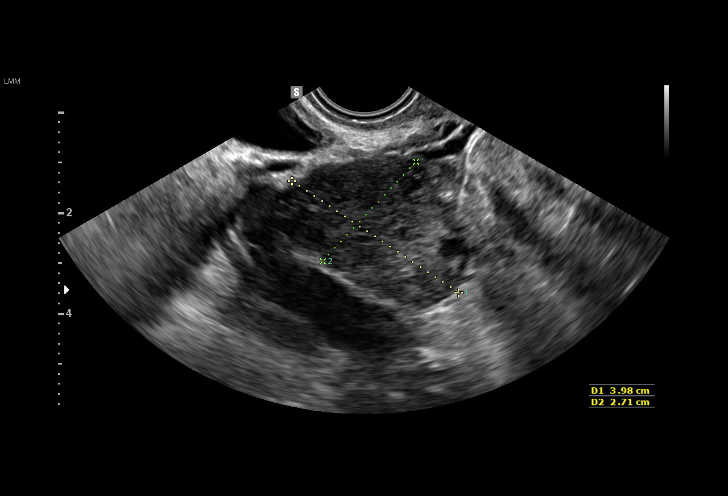
[im 30/36]
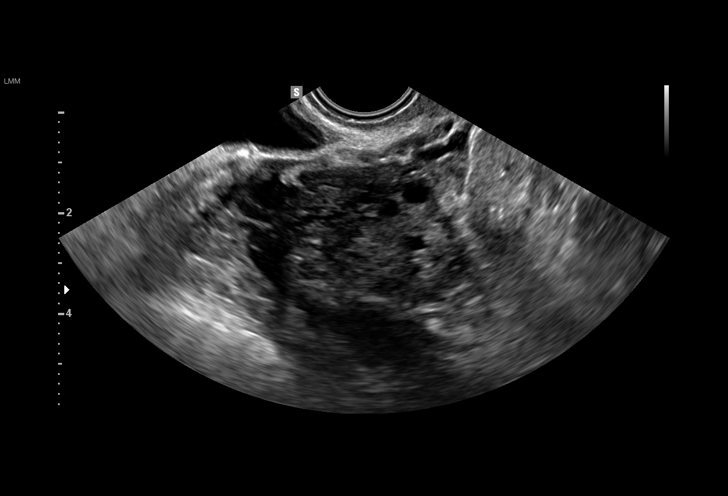
[im 33/36]
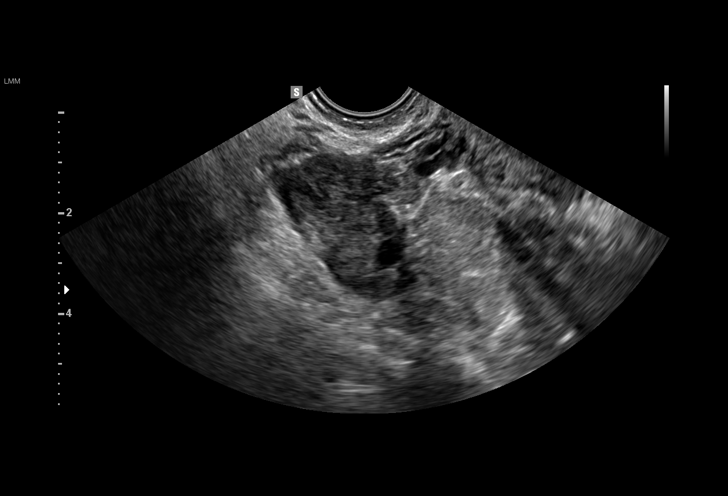
[im 36/36]
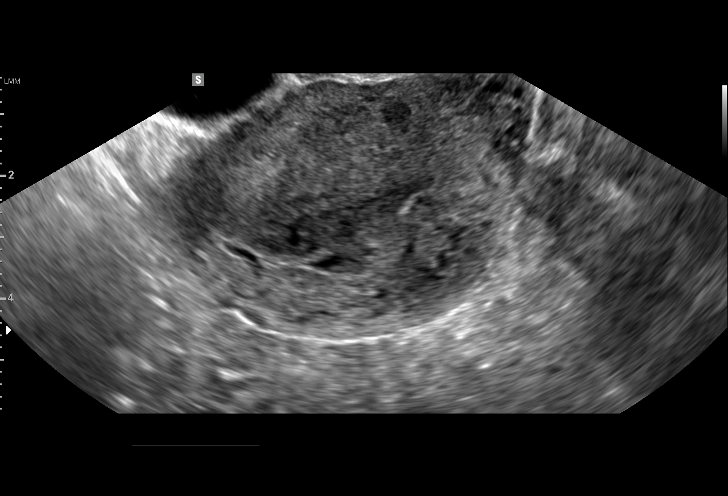

[15 of 28 positions shown; findings below may reference images not displayed]

FINDINGS: Intrauterine gestational sac: Small amount of anechoic free fluid in
the endometrial cavity versus 7 mm gestational sac.

Yolk sac:  Not present

Embryo:  Not present

Cardiac Activity: Not present

Subchorionic hemorrhage:  None visualized.

Maternal uterus/adnexae: Normal appearance of the RIGHT ovary. The
LEFT ovary was better characterized on prior sonogram, limited by
ovarian position. No free fluid.
IMPRESSION: Small amount of free fluid in the endometrial cavity versus early
gestational sac. No yolk sac, fetal pole, or cardiac activity yet
visualized. Recommend follow-up quantitative B-HCG levels and
follow-up US in 14 days to confirm and assess viability. This
recommendation follows SRU consensus guidelines: Diagnostic Criteria
for Nonviable Pregnancy Early in the First Trimester. N Engl J Med

## 2016-09-10 IMAGING — US US OB TRANSVAGINAL
1 series · 15 of 28 positions shown · non-contrast
Comparison: Pelvic ultrasound dated 09/15/2015 and 09/18/2015.

CLINICAL DATA: Left lower quadrant pain and fever for 2 days.

EXAM:
TRANSVAGINAL OB ULTRASOUND
TECHNIQUE: Transvaginal ultrasound was performed for complete evaluation of the
gestation as well as the maternal uterus, adnexal regions, and
pelvic cul-de-sac.

[Series 1: us ob transvaginal · 15 of 38 slices shown]
[im 1/38]
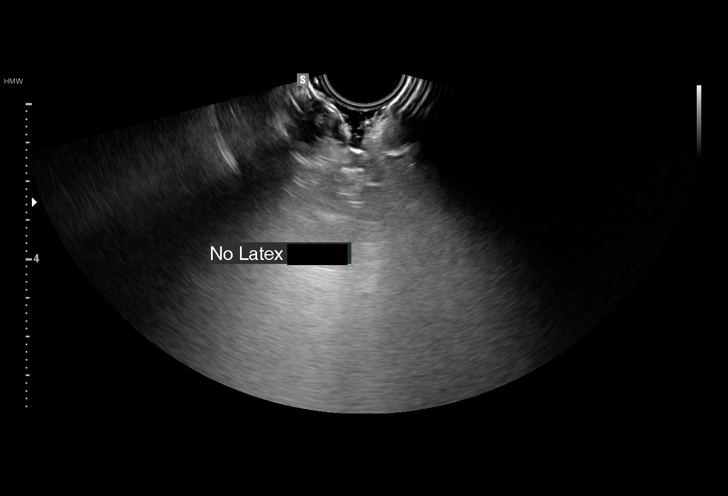
[im 3/38]
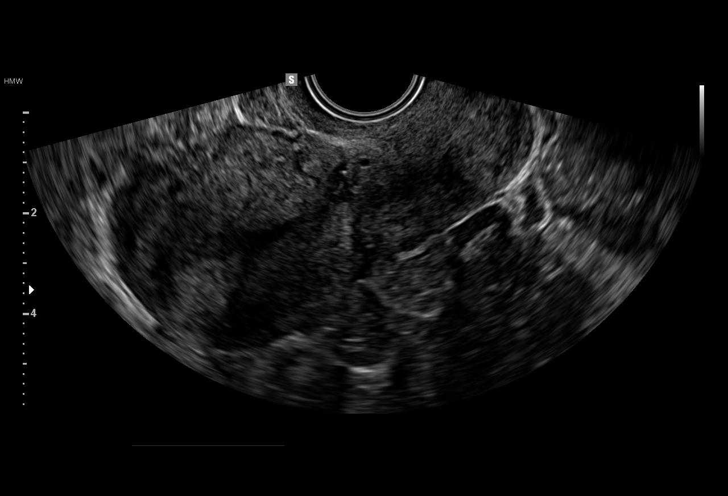
[im 6/38]
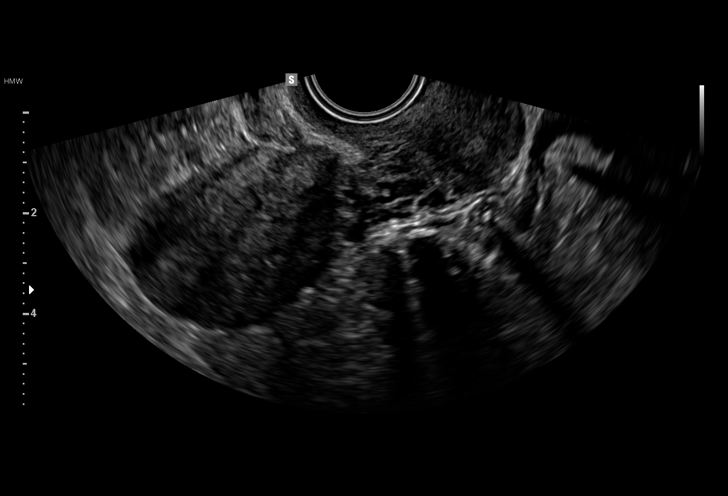
[im 9/38]
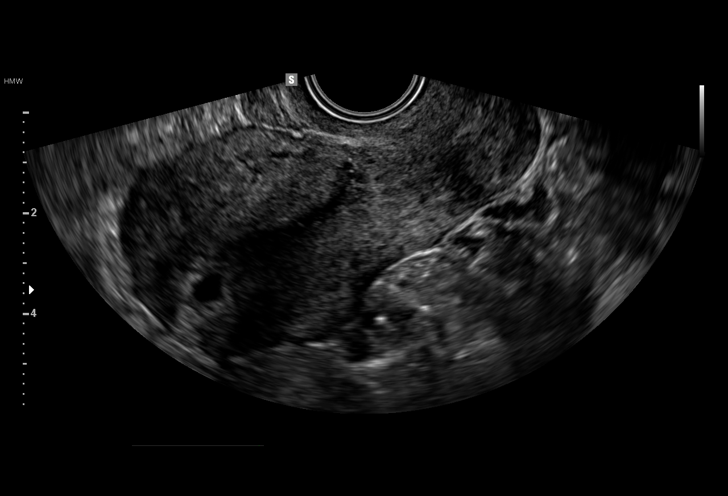
[im 11/38]
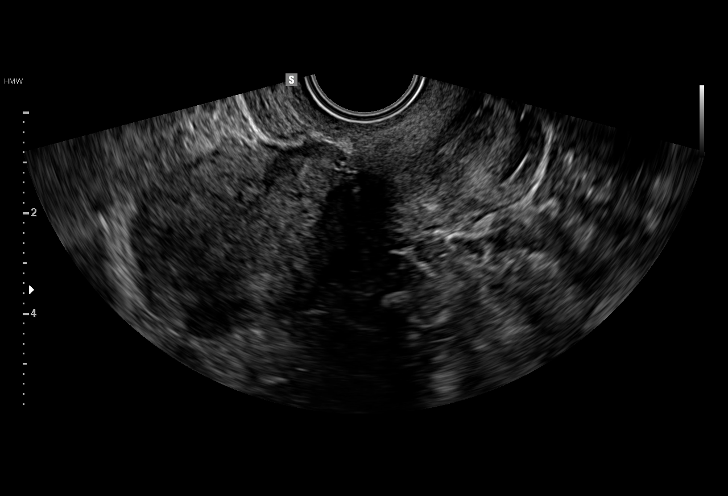
[im 14/38]
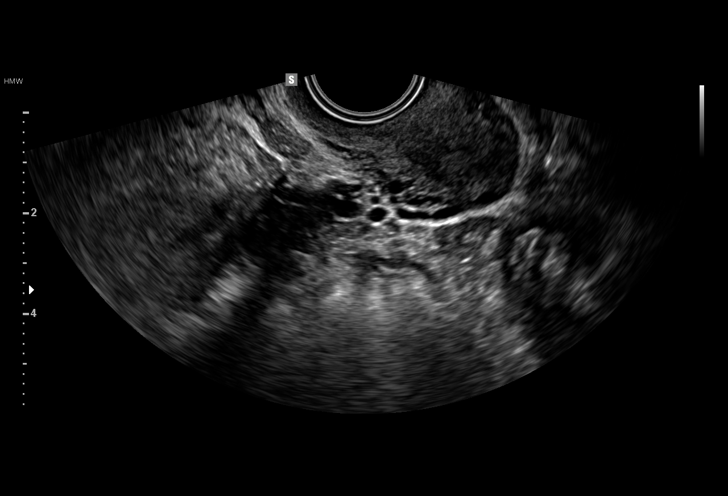
[im 17/38]
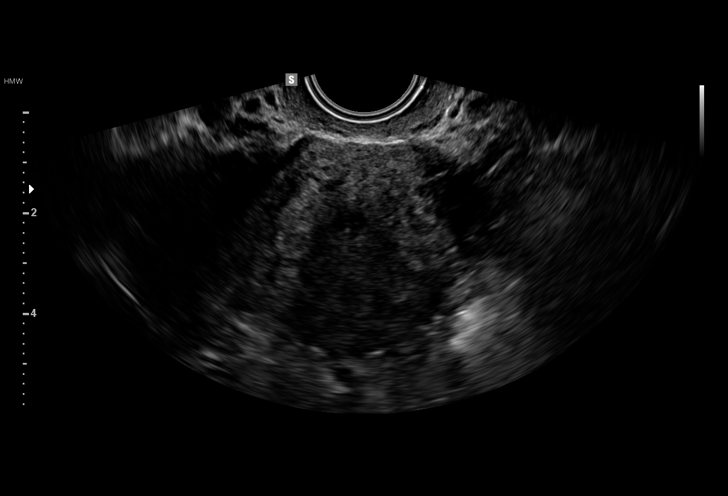
[im 20/38]
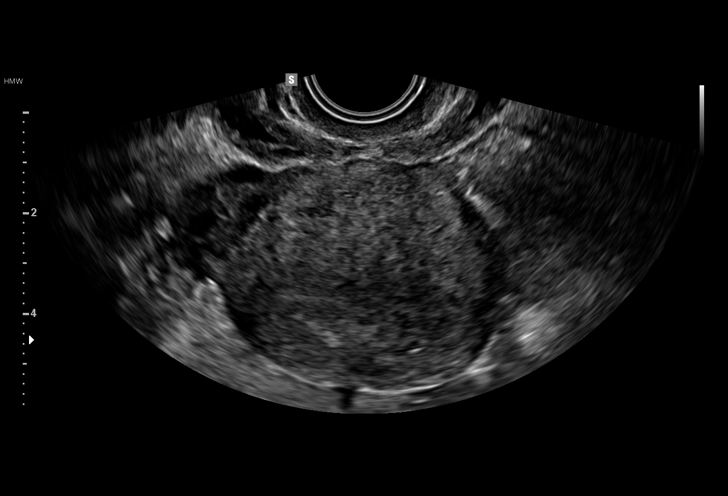
[im 21/38]
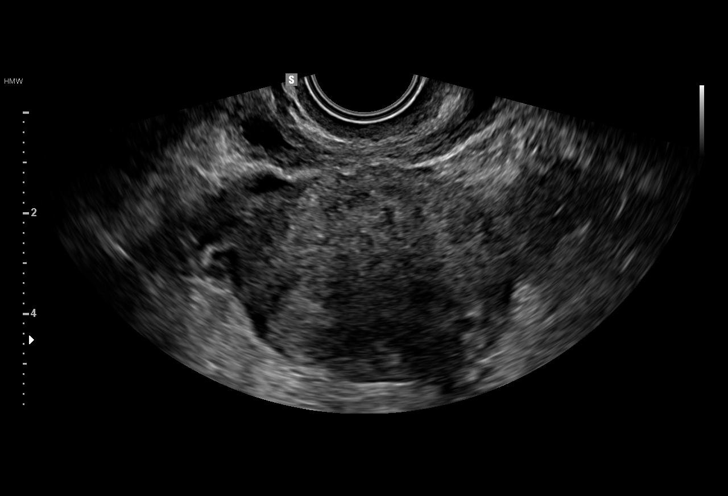
[im 24/38]
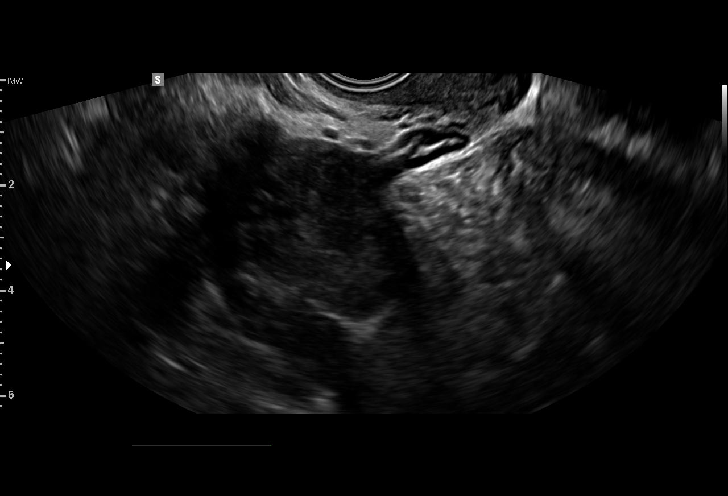
[im 27/38]
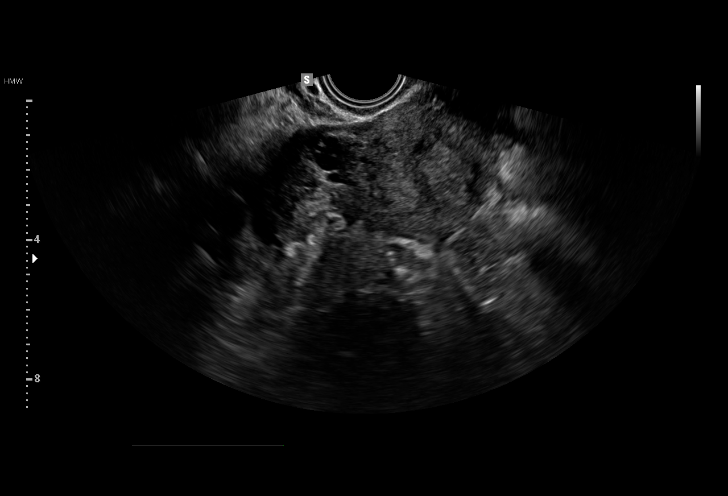
[im 29/38]
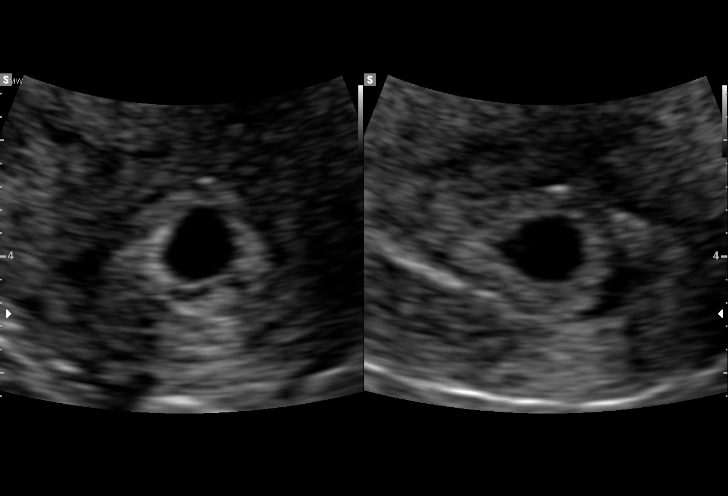
[im 32/38]
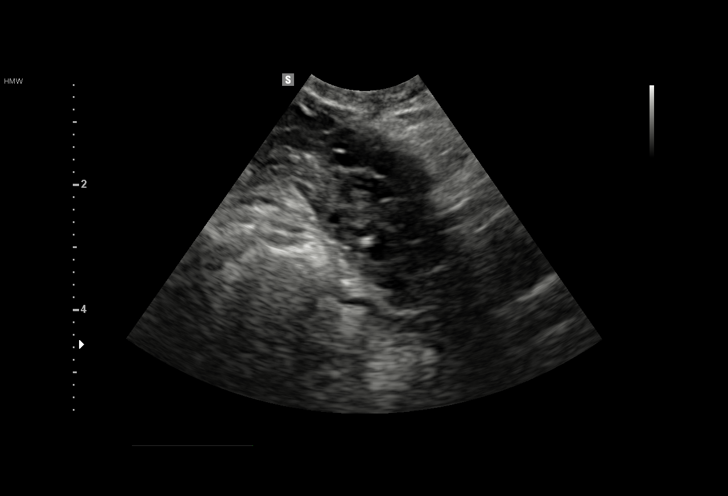
[im 35/38]
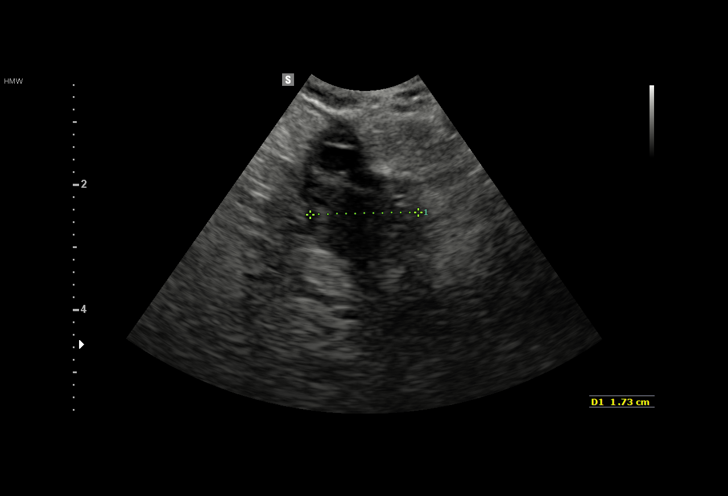
[im 38/38]
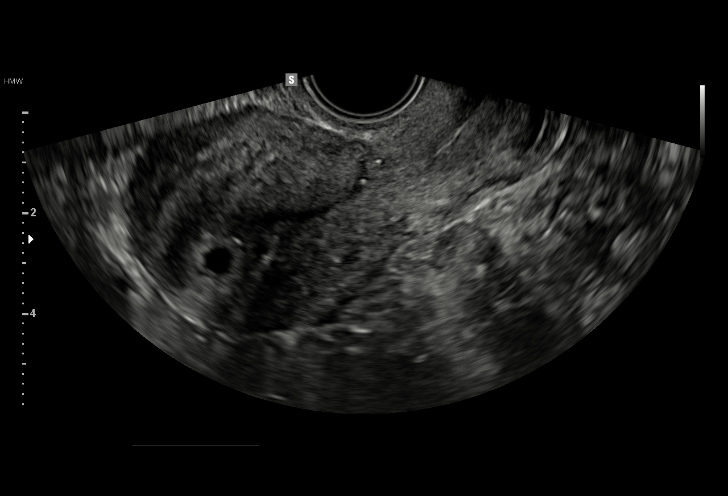

[15 of 28 positions shown; findings below may reference images not displayed]

FINDINGS: Intrauterine gestational sac: Present.

Yolk sac:  Not present.

Embryo:  Not present.

MSD: 0.64 cm  mm   5 w   2  d

Subchorionic hemorrhage:  Small.

Maternal uterus/adnexae: Within normal limits.

No evidence of free pelvic fluid.
IMPRESSION: Single intrauterine gestational sac, which corresponds to 5 weeks
and 2 days gestation. No yolk sac or fetal pole seen. Small
subchorionic hemorrhage noted.

This may represent an early normal intrauterine pregnancy
corresponding to 5 weeks 2 days gestation. However patient's
expected gestational age is 8 weeks and 3 days, and therefore these
findings may represent an abnormally progressing pregnancy.

Probable early intrauterine gestational sac, but no yolk sac, fetal
pole, or cardiac activity yet visualized. Recommend follow-up
quantitative B-HCG levels and follow-up US in 14 days to confirm and
assess viability. This recommendation follows SRU consensus
guidelines: Diagnostic Criteria for Nonviable Pregnancy Early in the
First Trimester. N Engl J Med 8302; [DATE].

## 2016-10-05 NOTE — Addendum Note (Signed)
Addendum  created 10/05/16 1406 by Markia Kyer, MD   Sign clinical note    

## 2016-10-05 NOTE — Anesthesia Postprocedure Evaluation (Signed)
Anesthesia Post Note  Patient: Allison Richard  Procedure(s) Performed: Procedure(s) (LRB): CESAREAN SECTION (N/A)     Anesthesia Post Evaluation  Last Vitals:  Vitals:   05/19/16 1849 05/20/16 0539  BP: 135/70 (!) 100/54  Pulse: (!) 105 84  Resp: 18 16  Temp: 36.7 C 37.1 C    Last Pain:  Vitals:   05/20/16 1052  TempSrc:   PainSc: 3                  Phillips Groutarignan, Irbin Fines

## 2017-02-01 NOTE — Telephone Encounter (Signed)
error 

## 2017-11-20 ENCOUNTER — Ambulatory Visit: Payer: Self-pay | Admitting: Physician Assistant

## 2017-11-22 ENCOUNTER — Ambulatory Visit: Payer: Self-pay | Admitting: Physician Assistant

## 2017-12-05 ENCOUNTER — Other Ambulatory Visit: Payer: Self-pay

## 2017-12-05 ENCOUNTER — Encounter (HOSPITAL_COMMUNITY): Payer: Self-pay | Admitting: Emergency Medicine

## 2017-12-05 ENCOUNTER — Emergency Department (HOSPITAL_COMMUNITY)
Admission: EM | Admit: 2017-12-05 | Discharge: 2017-12-06 | Disposition: A | Discharge status: Home / Self Care | Payer: Self-pay | Attending: Emergency Medicine | Admitting: Emergency Medicine

## 2017-12-05 ENCOUNTER — Emergency Department (HOSPITAL_COMMUNITY): Payer: Self-pay

## 2017-12-05 DIAGNOSIS — R350 Frequency of micturition: Secondary | ICD-10-CM | POA: Insufficient documentation

## 2017-12-05 DIAGNOSIS — R319 Hematuria, unspecified: Secondary | ICD-10-CM | POA: Insufficient documentation

## 2017-12-05 DIAGNOSIS — Z87891 Personal history of nicotine dependence: Secondary | ICD-10-CM | POA: Insufficient documentation

## 2017-12-05 DIAGNOSIS — Z79899 Other long term (current) drug therapy: Secondary | ICD-10-CM | POA: Insufficient documentation

## 2017-12-05 DIAGNOSIS — R200 Anesthesia of skin: Secondary | ICD-10-CM | POA: Insufficient documentation

## 2017-12-05 DIAGNOSIS — N200 Calculus of kidney: Secondary | ICD-10-CM | POA: Insufficient documentation

## 2017-12-05 DIAGNOSIS — R3915 Urgency of urination: Secondary | ICD-10-CM | POA: Insufficient documentation

## 2017-12-05 DIAGNOSIS — R202 Paresthesia of skin: Secondary | ICD-10-CM | POA: Insufficient documentation

## 2017-12-05 LAB — CBC WITH DIFFERENTIAL/PLATELET
Abs Immature Granulocytes: 0 10*3/uL (ref 0.0–0.1)
BASOS ABS: 0 10*3/uL (ref 0.0–0.1)
BASOS PCT: 0 %
EOS ABS: 0.1 10*3/uL (ref 0.0–0.7)
EOS PCT: 1 %
HCT: 43.7 % (ref 36.0–46.0)
HEMOGLOBIN: 14.4 g/dL (ref 12.0–15.0)
Immature Granulocytes: 0 %
Lymphocytes Relative: 36 %
Lymphs Abs: 2.6 10*3/uL (ref 0.7–4.0)
MCH: 29 pg (ref 26.0–34.0)
MCHC: 33 g/dL (ref 30.0–36.0)
MCV: 88.1 fL (ref 78.0–100.0)
Monocytes Absolute: 0.5 10*3/uL (ref 0.1–1.0)
Monocytes Relative: 7 %
Neutro Abs: 4 10*3/uL (ref 1.7–7.7)
Neutrophils Relative %: 56 %
Platelets: 217 10*3/uL (ref 150–400)
RBC: 4.96 MIL/uL (ref 3.87–5.11)
RDW: 12.8 % (ref 11.5–15.5)
WBC: 7.1 10*3/uL (ref 4.0–10.5)

## 2017-12-05 LAB — URINALYSIS, ROUTINE W REFLEX MICROSCOPIC
Bacteria, UA: NONE SEEN
Bilirubin Urine: NEGATIVE
GLUCOSE, UA: NEGATIVE mg/dL
KETONES UR: NEGATIVE mg/dL
Leukocytes, UA: NEGATIVE
Nitrite: NEGATIVE
PH: 5 (ref 5.0–8.0)
PROTEIN: 30 mg/dL — AB
Specific Gravity, Urine: 1.024 (ref 1.005–1.030)

## 2017-12-05 LAB — BASIC METABOLIC PANEL
ANION GAP: 12 (ref 5–15)
BUN: 11 mg/dL (ref 6–20)
CALCIUM: 9.7 mg/dL (ref 8.9–10.3)
CO2: 23 mmol/L (ref 22–32)
Chloride: 104 mmol/L (ref 98–111)
Creatinine, Ser: 0.88 mg/dL (ref 0.44–1.00)
GLUCOSE: 96 mg/dL (ref 70–99)
Potassium: 4 mmol/L (ref 3.5–5.1)
Sodium: 139 mmol/L (ref 135–145)

## 2017-12-05 LAB — I-STAT CHEM 8, ED
BUN: 13 mg/dL (ref 6–20)
CALCIUM ION: 1.18 mmol/L (ref 1.15–1.40)
Chloride: 103 mmol/L (ref 98–111)
Creatinine, Ser: 0.9 mg/dL (ref 0.44–1.00)
Glucose, Bld: 95 mg/dL (ref 70–99)
HEMATOCRIT: 44 % (ref 36.0–46.0)
Hemoglobin: 15 g/dL (ref 12.0–15.0)
Potassium: 3.9 mmol/L (ref 3.5–5.1)
SODIUM: 140 mmol/L (ref 135–145)
TCO2: 27 mmol/L (ref 22–32)

## 2017-12-05 LAB — I-STAT BETA HCG BLOOD, ED (MC, WL, AP ONLY): I-stat hCG, quantitative: <5 m[IU]/mL (ref <5)

## 2017-12-05 LAB — POC URINE PREG, ED: Preg Test, Ur: NEGATIVE

## 2017-12-05 MED ORDER — FENTANYL CITRATE (PF) 100 MCG/2ML IJ SOLN
50.0000 ug | Freq: Once | INTRAMUSCULAR | Status: AC
  Administered 2017-12-06: 50 ug via INTRAVENOUS
  Filled 2017-12-05: qty 2

## 2017-12-05 MED ORDER — SODIUM CHLORIDE 0.9 % IV BOLUS
1000.0000 mL | Freq: Once | INTRAVENOUS | Status: AC
  Administered 2017-12-05: 1000 mL via INTRAVENOUS

## 2017-12-05 MED ORDER — DEXAMETHASONE 4 MG PO TABS
10.0000 mg | ORAL_TABLET | Freq: Once | ORAL | Status: AC
  Administered 2017-12-06: 10 mg via ORAL
  Filled 2017-12-05: qty 3

## 2017-12-05 MED ORDER — ONDANSETRON HCL 4 MG/2ML IJ SOLN
4.0000 mg | Freq: Once | INTRAMUSCULAR | Status: AC
  Administered 2017-12-05: 4 mg via INTRAVENOUS
  Filled 2017-12-05: qty 2

## 2017-12-05 MED ORDER — KETOROLAC TROMETHAMINE 30 MG/ML IJ SOLN
30.0000 mg | Freq: Once | INTRAMUSCULAR | Status: AC
  Administered 2017-12-06: 30 mg via INTRAVENOUS
  Filled 2017-12-05: qty 1

## 2017-12-05 MED ORDER — FENTANYL CITRATE (PF) 100 MCG/2ML IJ SOLN
50.0000 ug | Freq: Once | INTRAMUSCULAR | Status: AC
  Administered 2017-12-05: 50 ug via INTRAVENOUS
  Filled 2017-12-05: qty 2

## 2017-12-05 MED ORDER — ONDANSETRON 4 MG PO TBDP
4.0000 mg | ORAL_TABLET | Freq: Once | ORAL | Status: AC | PRN
  Administered 2017-12-05: 4 mg via ORAL
  Filled 2017-12-05: qty 1

## 2017-12-05 NOTE — ED Notes (Signed)
Mini-lab to check on POC urine-preg

## 2017-12-05 NOTE — ED Notes (Signed)
Pt returned from CT °

## 2017-12-05 NOTE — ED Notes (Signed)
ED Provider at bedside. 
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
## 2017-12-06 ENCOUNTER — Other Ambulatory Visit: Payer: Self-pay

## 2017-12-06 ENCOUNTER — Encounter: Payer: Self-pay | Admitting: Physician Assistant

## 2017-12-06 ENCOUNTER — Ambulatory Visit: Payer: Self-pay | Admitting: Physician Assistant

## 2017-12-06 VITALS — BP 114/75 | HR 112 | Temp 99.4°F | Resp 18 | Ht 64.0 in | Wt 171.8 lb

## 2017-12-06 DIAGNOSIS — F5105 Insomnia due to other mental disorder: Secondary | ICD-10-CM

## 2017-12-06 DIAGNOSIS — F418 Other specified anxiety disorders: Secondary | ICD-10-CM

## 2017-12-06 DIAGNOSIS — F99 Mental disorder, not otherwise specified: Secondary | ICD-10-CM

## 2017-12-06 DIAGNOSIS — F1190 Opioid use, unspecified, uncomplicated: Secondary | ICD-10-CM

## 2017-12-06 DIAGNOSIS — Z23 Encounter for immunization: Secondary | ICD-10-CM

## 2017-12-06 DIAGNOSIS — F410 Panic disorder [episodic paroxysmal anxiety] without agoraphobia: Secondary | ICD-10-CM

## 2017-12-06 DIAGNOSIS — M545 Low back pain: Secondary | ICD-10-CM

## 2017-12-06 DIAGNOSIS — N200 Calculus of kidney: Secondary | ICD-10-CM

## 2017-12-06 DIAGNOSIS — R569 Unspecified convulsions: Secondary | ICD-10-CM

## 2017-12-06 DIAGNOSIS — G8929 Other chronic pain: Secondary | ICD-10-CM

## 2017-12-06 MED ORDER — CYCLOBENZAPRINE HCL 10 MG PO TABS: 10.0000 mg | ORAL_TABLET | Freq: Three times a day (TID) | ORAL | 1 refills | Status: DC | PRN

## 2017-12-06 MED ORDER — ONDANSETRON HCL 4 MG PO TABS: 4.0000 mg | ORAL_TABLET | Freq: Four times a day (QID) | ORAL | 0 refills | Status: DC

## 2017-12-06 MED ORDER — ALPRAZOLAM 1 MG PO TABS: 1.0000 mg | ORAL_TABLET | Freq: Two times a day (BID) | ORAL | 0 refills | Status: AC | PRN

## 2017-12-06 MED ORDER — NAPROXEN 500 MG PO TABS: 500.0000 mg | ORAL_TABLET | Freq: Two times a day (BID) | ORAL | 0 refills | Status: AC

## 2017-12-06 MED ORDER — QUETIAPINE FUMARATE 300 MG PO TABS: 300.0000 mg | ORAL_TABLET | Freq: Every day | ORAL | 0 refills | Status: AC

## 2017-12-06 MED ORDER — OXYCODONE-ACETAMINOPHEN 5-325 MG PO TABS: 1.0000 | ORAL_TABLET | Freq: Three times a day (TID) | ORAL | 0 refills | Status: DC | PRN

## 2017-12-06 MED ORDER — ESCITALOPRAM OXALATE 20 MG PO TABS: 30.0000 mg | ORAL_TABLET | Freq: Every day | ORAL | 0 refills | Status: AC

## 2017-12-06 MED ORDER — OXYCODONE-ACETAMINOPHEN 5-325 MG PO TABS: 1.0000 | ORAL_TABLET | Freq: Three times a day (TID) | ORAL | 0 refills | Status: AC | PRN

## 2017-12-06 MED ORDER — TAMSULOSIN HCL 0.4 MG PO CAPS: 0.4000 mg | ORAL_CAPSULE | Freq: Every day | ORAL | 0 refills | Status: DC

## 2017-12-06 NOTE — Patient Instructions (Addendum)
   [facility] - [address], [place], [place] [address] Phone: [phone]   If you have lab work done today you will be contacted with your lab results within the next 2 weeks.  If you have not heard from us then please contact us. The fastest way to get your results is to register for My Chart.   IF you received an x-ray today, you will receive an invoice from [facility]. Please contact [facility] at [phone] with questions or concerns regarding your invoice.   IF you received labwork today, you will receive an invoice from [facility]. Please contact [facility] at [phone] with questions or concerns regarding your invoice.   Our billing staff will not be able to assist you with questions regarding bills from these companies.  You will be contacted with the lab results as soon as they are available. The fastest way to get your results is to activate your My Chart account. Instructions are located on the last page of this paperwork. If you have not heard from us regarding the results in 2 weeks, please contact this office.     
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
## 2017-12-06 NOTE — Discharge Instructions (Signed)
Please read and follow all provided instructions.  You have been diagnosed with kidney stones.    Tests performed today include: Urine test that showed blood in your urine and no infection CT scan which showed a 3 millimeter kidney on the right side ?Blood test that showed normal kidney function Vital signs. See below for your results today.    You are being provided a prescription for opiates (also known as narcotics) for pain control.  Opiates can be addictive and should only be used when absolutely necessary for pain control when other alternatives do not work.  We recommend you only use them for the recommended amount of time and only as prescribed.  Please do not take with other sedative medications or alcohol.  Please do not drive, operate machinery, or make important decisions while taking opiates.  Please note that these medications can be addictive and have high abuse potential.  Please keep these medications locked away from children, teenagers or any family members with history of substance abuse.  Note that your pain medication contains Acetaminophen (Tylenol), therefore it is not recommended to take additional Tylenol while on your pain medication (please read labels!). Continue to drink fluids to help you pass the stones. I recommend that you double you fluid intake for the next several days. Strain your urine and save any stones that may pass.  Use Zofran for nausea as directed. Flomax is used to help facilitate passing the stone by opening up the Ureters. Followup with your primary care doctor in regards to your hospital visit.  Return to the ED immediately if you develop fever that persists > 101, uncontrolled pain or vomiting, or other concerns.  Read the instructions below to learn more about kidney stones.  If you do not have a primary care doctor to follow-up with, use the resource guide attached to help you find one.  What are Kidney Stones? Kidney stones (ureteral lithiasis) are  solid masses that form inside your kidneys. The intense pain is caused by the stone moving through the kidney, ureter, bladder, and urethra (urinary tract). When the stone moves, the ureter starts to spasm around the stone. The stone is usually passed in the urine. This can take time to occur.  HOME CARE Drink enough fluids to keep your pee (urine) clear or pale yellow. This helps to get the stone out.  Only take medicine as told by your doctor.  Follow up with your doctor as told.  GET HELP RIGHT AWAY IF:  Your pain does not get better with medicine. Or your pain increases and gets worse over 18 hours.  You have a fever (>101).  You have new belly (abdominal) pain.  You have uncontrolled vomiting You feel faint or pass out.  Additional Information:  Your vital signs today were: BP 126/84    Pulse (!) 105    Temp 98.9 F (37.2 C)    Resp 18    Ht 5\' 4"  (1.626 m)    Wt 74.8 kg    LMP 11/18/2017    SpO2 99%    BMI 28.32 kg/m  If your blood pressure (BP) was elevated above 135/85 this visit, please have this repeated by your doctor within one month. ---------------

## 2017-12-18 ENCOUNTER — Encounter: Payer: Self-pay | Admitting: Physician Assistant

## 2017-12-23 ENCOUNTER — Other Ambulatory Visit: Payer: Self-pay | Admitting: Physician Assistant

## 2017-12-23 DIAGNOSIS — F410 Panic disorder [episodic paroxysmal anxiety] without agoraphobia: Secondary | ICD-10-CM

## 2017-12-23 DIAGNOSIS — M545 Low back pain: Secondary | ICD-10-CM

## 2017-12-23 DIAGNOSIS — F99 Mental disorder, not otherwise specified: Secondary | ICD-10-CM

## 2017-12-23 DIAGNOSIS — F5105 Insomnia due to other mental disorder: Secondary | ICD-10-CM

## 2017-12-23 DIAGNOSIS — Z87898 Personal history of other specified conditions: Secondary | ICD-10-CM

## 2017-12-23 DIAGNOSIS — G8929 Other chronic pain: Secondary | ICD-10-CM

## 2017-12-23 DIAGNOSIS — F1191 Opioid use, unspecified, in remission: Secondary | ICD-10-CM

## 2017-12-23 DIAGNOSIS — R569 Unspecified convulsions: Secondary | ICD-10-CM

## 2017-12-23 DIAGNOSIS — F418 Other specified anxiety disorders: Secondary | ICD-10-CM

## 2017-12-23 NOTE — Progress Notes (Signed)
Did referral for pyschiatry.

## 2018-01-03 ENCOUNTER — Other Ambulatory Visit: Payer: Self-pay | Admitting: Urology

## 2018-01-04 ENCOUNTER — Encounter (HOSPITAL_BASED_OUTPATIENT_CLINIC_OR_DEPARTMENT_OTHER): Payer: Self-pay | Admitting: *Deleted

## 2018-01-07 ENCOUNTER — Other Ambulatory Visit: Payer: Self-pay

## 2018-01-07 ENCOUNTER — Encounter (HOSPITAL_BASED_OUTPATIENT_CLINIC_OR_DEPARTMENT_OTHER): Payer: Self-pay | Admitting: *Deleted

## 2018-01-07 NOTE — Progress Notes (Signed)
Spoke w/ pt via phone for pre-op interview.  Npo after mn.  Arrive at 0800.  Needs cbc, cmet, and urine preg. (pt leaves in [place], [place]).  Will take lexapro and if needed percocet/ xanax am dos w/ sips of water.

## 2018-01-08 NOTE — Anesthesia Preprocedure Evaluation (Addendum)
Anesthesia Evaluation  Patient identified by MRN, date of birth, ID band Patient awake    Reviewed: Allergy & Precautions, NPO status , Patient's Chart, lab work & pertinent test results  Airway Mallampati: II  TM Distance: >3 FB Neck ROM: Full    Dental no notable dental hx. (+) Teeth Intact, Dental Advisory Given   Pulmonary former smoker,    Pulmonary exam normal breath sounds clear to auscultation       Cardiovascular negative cardio ROS Normal cardiovascular exam Rhythm:Regular Rate:Normal     Neuro/Psych Bipolar Disorder    GI/Hepatic negative GI ROS, (+)     substance abuse  IV drug use,   Endo/Other  negative endocrine ROS  Renal/GU negative Renal ROS     Musculoskeletal negative musculoskeletal ROS (+)   Abdominal   Peds  Hematology negative hematology ROS (+)   Anesthesia Other Findings   Reproductive/Obstetrics negative OB ROS                           Anesthesia Physical Anesthesia Plan  ASA: II  Anesthesia Plan: General   Post-op Pain Management:    Induction: Intravenous  PONV Risk Score and Plan: 3 and Treatment may vary due to age or medical condition, Ondansetron, Dexamethasone and Scopolamine patch - Pre-op  Airway Management Planned: Oral ETT and LMA  Additional Equipment:   Intra-op Plan:   Post-operative Plan:   Informed Consent: I have reviewed the patients History and Physical, chart, labs and discussed the procedure including the risks, benefits and alternatives for the proposed anesthesia with the patient or authorized representative who has indicated his/her understanding and acceptance.   Dental advisory given  Plan Discussed with:   Anesthesia Plan Comments:        Anesthesia Quick Evaluation

## 2018-01-09 ENCOUNTER — Ambulatory Visit (HOSPITAL_BASED_OUTPATIENT_CLINIC_OR_DEPARTMENT_OTHER): Admitting: Anesthesiology

## 2018-01-09 ENCOUNTER — Encounter (HOSPITAL_BASED_OUTPATIENT_CLINIC_OR_DEPARTMENT_OTHER)
Admission: RE | Disposition: A | Discharge status: Home / Self Care | Payer: Self-pay | Source: Ambulatory Visit | Attending: Urology

## 2018-01-09 ENCOUNTER — Ambulatory Visit (HOSPITAL_BASED_OUTPATIENT_CLINIC_OR_DEPARTMENT_OTHER)
Admission: RE | Admit: 2018-01-09 | Discharge: 2018-01-09 | Disposition: A | Discharge status: Home / Self Care | Source: Ambulatory Visit | Attending: Urology | Admitting: Urology

## 2018-01-09 ENCOUNTER — Encounter (HOSPITAL_BASED_OUTPATIENT_CLINIC_OR_DEPARTMENT_OTHER): Payer: Self-pay | Admitting: *Deleted

## 2018-01-09 DIAGNOSIS — F1411 Cocaine abuse, in remission: Secondary | ICD-10-CM | POA: Diagnosis not present

## 2018-01-09 DIAGNOSIS — Z87442 Personal history of urinary calculi: Secondary | ICD-10-CM | POA: Insufficient documentation

## 2018-01-09 DIAGNOSIS — F1211 Cannabis abuse, in remission: Secondary | ICD-10-CM | POA: Insufficient documentation

## 2018-01-09 DIAGNOSIS — Z818 Family history of other mental and behavioral disorders: Secondary | ICD-10-CM | POA: Diagnosis not present

## 2018-01-09 DIAGNOSIS — F1911 Other psychoactive substance abuse, in remission: Secondary | ICD-10-CM | POA: Diagnosis not present

## 2018-01-09 DIAGNOSIS — R319 Hematuria, unspecified: Secondary | ICD-10-CM | POA: Diagnosis not present

## 2018-01-09 DIAGNOSIS — Z885 Allergy status to narcotic agent status: Secondary | ICD-10-CM | POA: Insufficient documentation

## 2018-01-09 DIAGNOSIS — F1011 Alcohol abuse, in remission: Secondary | ICD-10-CM | POA: Diagnosis not present

## 2018-01-09 DIAGNOSIS — N201 Calculus of ureter: Secondary | ICD-10-CM | POA: Diagnosis present

## 2018-01-09 DIAGNOSIS — N23 Unspecified renal colic: Secondary | ICD-10-CM | POA: Insufficient documentation

## 2018-01-09 DIAGNOSIS — Z888 Allergy status to other drugs, medicaments and biological substances status: Secondary | ICD-10-CM | POA: Diagnosis not present

## 2018-01-09 DIAGNOSIS — Z87891 Personal history of nicotine dependence: Secondary | ICD-10-CM | POA: Diagnosis not present

## 2018-01-09 DIAGNOSIS — B1920 Unspecified viral hepatitis C without hepatic coma: Secondary | ICD-10-CM | POA: Diagnosis not present

## 2018-01-09 DIAGNOSIS — F319 Bipolar disorder, unspecified: Secondary | ICD-10-CM | POA: Diagnosis not present

## 2018-01-09 DIAGNOSIS — F419 Anxiety disorder, unspecified: Secondary | ICD-10-CM | POA: Diagnosis not present

## 2018-01-09 DIAGNOSIS — F410 Panic disorder [episodic paroxysmal anxiety] without agoraphobia: Secondary | ICD-10-CM | POA: Insufficient documentation

## 2018-01-09 HISTORY — DX: Personal history of diseases of the blood and blood-forming organs and certain disorders involving the immune mechanism: Z86.2

## 2018-01-09 HISTORY — DX: Alcohol abuse, in remission: F10.11

## 2018-01-09 HISTORY — DX: Encounter for adoption services: Z02.82

## 2018-01-09 HISTORY — DX: Personal history of other mental and behavioral disorders: Z86.59

## 2018-01-09 HISTORY — DX: Calculus of ureter: N20.1

## 2018-01-09 HISTORY — DX: Other psychoactive substance abuse, in remission: F19.11

## 2018-01-09 HISTORY — DX: Personal history of self-harm: Z91.5

## 2018-01-09 HISTORY — PX: CYSTOSCOPY/URETEROSCOPY/HOLMIUM LASER/STENT PLACEMENT: SHX6546

## 2018-01-09 HISTORY — DX: Personal history of other diseases of the female genital tract: Z87.42

## 2018-01-09 HISTORY — DX: Other specified health status: Z78.9

## 2018-01-09 HISTORY — DX: Alcohol abuse, uncomplicated: F10.10

## 2018-01-09 HISTORY — DX: Personal history of other specified conditions: Z87.898

## 2018-01-09 LAB — POCT PREGNANCY, URINE: Preg Test, Ur: NEGATIVE

## 2018-01-09 SURGERY — CYSTOSCOPY/URETEROSCOPY/HOLMIUM LASER/STENT PLACEMENT
Anesthesia: General | Laterality: Right

## 2018-01-09 MED ORDER — MIDAZOLAM HCL 2 MG/2ML IJ SOLN
INTRAMUSCULAR | Status: DC | PRN
  Administered 2018-01-09: 2 mg via INTRAVENOUS

## 2018-01-09 MED ORDER — ONDANSETRON HCL 4 MG/2ML IJ SOLN
INTRAMUSCULAR | Status: AC
  Filled 2018-01-09: qty 2

## 2018-01-09 MED ORDER — SODIUM CHLORIDE 0.9 % IR SOLN
Status: DC | PRN
  Administered 2018-01-09: 3000 mL via INTRAVESICAL

## 2018-01-09 MED ORDER — OXYCODONE-ACETAMINOPHEN 5-325 MG PO TABS
1.0000 | ORAL_TABLET | Freq: Once | ORAL | Status: AC
  Administered 2018-01-09: 1 via ORAL
  Filled 2018-01-09: qty 1

## 2018-01-09 MED ORDER — ONDANSETRON HCL 4 MG/2ML IJ SOLN
INTRAMUSCULAR | Status: DC | PRN
  Administered 2018-01-09: 4 mg via INTRAVENOUS

## 2018-01-09 MED ORDER — DEXAMETHASONE SODIUM PHOSPHATE 10 MG/ML IJ SOLN
INTRAMUSCULAR | Status: AC
  Filled 2018-01-09: qty 1

## 2018-01-09 MED ORDER — LIDOCAINE 2% (20 MG/ML) 5 ML SYRINGE
INTRAMUSCULAR | Status: DC | PRN
  Administered 2018-01-09: 100 mg via INTRAVENOUS

## 2018-01-09 MED ORDER — PROMETHAZINE HCL 25 MG/ML IJ SOLN
6.2500 mg | INTRAMUSCULAR | Status: DC | PRN
  Filled 2018-01-09: qty 1

## 2018-01-09 MED ORDER — OXYBUTYNIN CHLORIDE 5 MG PO TABS: 5.0000 mg | ORAL_TABLET | Freq: Three times a day (TID) | ORAL | 1 refills | Status: AC | PRN

## 2018-01-09 MED ORDER — OXYCODONE-ACETAMINOPHEN 5-325 MG PO TABS: 1.0000 | ORAL_TABLET | ORAL | 0 refills | Status: DC | PRN

## 2018-01-09 MED ORDER — HYDROMORPHONE HCL 1 MG/ML IJ SOLN
0.2500 mg | INTRAMUSCULAR | Status: DC | PRN
  Administered 2018-01-09: 0.25 mg via INTRAVENOUS
  Filled 2018-01-09: qty 0.5

## 2018-01-09 MED ORDER — HYDROCODONE-ACETAMINOPHEN 7.5-325 MG PO TABS
1.0000 | ORAL_TABLET | Freq: Once | ORAL | Status: DC | PRN
  Filled 2018-01-09: qty 1

## 2018-01-09 MED ORDER — PHENAZOPYRIDINE HCL 200 MG PO TABS: 200.0000 mg | ORAL_TABLET | Freq: Three times a day (TID) | ORAL | 0 refills | Status: AC | PRN

## 2018-01-09 MED ORDER — DEXMEDETOMIDINE HCL IN NACL 400 MCG/100ML IV SOLN
INTRAVENOUS | Status: AC
  Filled 2018-01-09: qty 100

## 2018-01-09 MED ORDER — MIDAZOLAM HCL 2 MG/2ML IJ SOLN
INTRAMUSCULAR | Status: AC
  Filled 2018-01-09: qty 2

## 2018-01-09 MED ORDER — CEFAZOLIN SODIUM-DEXTROSE 2-4 GM/100ML-% IV SOLN
INTRAVENOUS | Status: AC
  Filled 2018-01-09: qty 100

## 2018-01-09 MED ORDER — FENTANYL CITRATE (PF) 100 MCG/2ML IJ SOLN
INTRAMUSCULAR | Status: AC
  Filled 2018-01-09: qty 2

## 2018-01-09 MED ORDER — LIDOCAINE 2% (20 MG/ML) 5 ML SYRINGE
INTRAMUSCULAR | Status: AC
  Filled 2018-01-09: qty 5

## 2018-01-09 MED ORDER — MEPERIDINE HCL 25 MG/ML IJ SOLN
6.2500 mg | INTRAMUSCULAR | Status: DC | PRN
  Filled 2018-01-09: qty 1

## 2018-01-09 MED ORDER — IOHEXOL 300 MG/ML  SOLN
INTRAMUSCULAR | Status: DC | PRN
  Administered 2018-01-09: 6 mL via URETHRAL

## 2018-01-09 MED ORDER — SCOPOLAMINE 1 MG/3DAYS TD PT72
1.0000 | MEDICATED_PATCH | Freq: Once | TRANSDERMAL | Status: DC
  Administered 2018-01-09: 1.5 mg via TRANSDERMAL
  Filled 2018-01-09: qty 1

## 2018-01-09 MED ORDER — PROPOFOL 10 MG/ML IV BOLUS
INTRAVENOUS | Status: AC
  Filled 2018-01-09: qty 20

## 2018-01-09 MED ORDER — ACETAMINOPHEN 10 MG/ML IV SOLN
1000.0000 mg | Freq: Once | INTRAVENOUS | Status: DC | PRN
  Filled 2018-01-09: qty 100

## 2018-01-09 MED ORDER — KETOROLAC TROMETHAMINE 30 MG/ML IJ SOLN
INTRAMUSCULAR | Status: AC
  Filled 2018-01-09: qty 1

## 2018-01-09 MED ORDER — SCOPOLAMINE 1 MG/3DAYS TD PT72
MEDICATED_PATCH | TRANSDERMAL | Status: AC
  Filled 2018-01-09: qty 1

## 2018-01-09 MED ORDER — KETOROLAC TROMETHAMINE 30 MG/ML IJ SOLN
INTRAMUSCULAR | Status: DC | PRN
  Administered 2018-01-09: 30 mg via INTRAVENOUS

## 2018-01-09 MED ORDER — LACTATED RINGERS IV SOLN
INTRAVENOUS | Status: DC
  Administered 2018-01-09: 09:00:00 via INTRAVENOUS
  Filled 2018-01-09: qty 1000

## 2018-01-09 MED ORDER — CEFAZOLIN SODIUM-DEXTROSE 2-4 GM/100ML-% IV SOLN
2.0000 g | Freq: Once | INTRAVENOUS | Status: AC
  Administered 2018-01-09: 2 g via INTRAVENOUS
  Filled 2018-01-09: qty 100

## 2018-01-09 MED ORDER — OXYCODONE-ACETAMINOPHEN 5-325 MG PO TABS
ORAL_TABLET | ORAL | Status: AC
  Filled 2018-01-09: qty 1

## 2018-01-09 MED ORDER — DEXAMETHASONE SODIUM PHOSPHATE 10 MG/ML IJ SOLN
INTRAMUSCULAR | Status: DC | PRN
  Administered 2018-01-09: 10 mg via INTRAVENOUS

## 2018-01-09 MED ORDER — DEXMEDETOMIDINE HCL IN NACL 400 MCG/100ML IV SOLN: INTRAVENOUS | Status: DC | PRN

## 2018-01-09 MED ORDER — HYDROMORPHONE HCL 1 MG/ML IJ SOLN
INTRAMUSCULAR | Status: AC
  Filled 2018-01-09: qty 1

## 2018-01-09 MED ORDER — OXYBUTYNIN CHLORIDE 5 MG PO TABS
5.0000 mg | ORAL_TABLET | Freq: Once | ORAL | Status: AC
  Administered 2018-01-09: 5 mg via ORAL
  Filled 2018-01-09: qty 1

## 2018-01-09 MED ORDER — ONDANSETRON HCL 4 MG PO TABS: 4.0000 mg | ORAL_TABLET | Freq: Every day | ORAL | 1 refills | Status: AC | PRN

## 2018-01-09 MED ORDER — SULFAMETHOXAZOLE-TRIMETHOPRIM 800-160 MG PO TABS: 1.0000 | ORAL_TABLET | Freq: Two times a day (BID) | ORAL | 0 refills | Status: AC

## 2018-01-09 MED ORDER — OXYBUTYNIN CHLORIDE 5 MG PO TABS
ORAL_TABLET | ORAL | Status: AC
  Filled 2018-01-09: qty 1

## 2018-01-09 MED ORDER — DEXMEDETOMIDINE HCL 200 MCG/2ML IV SOLN
INTRAVENOUS | Status: DC | PRN
  Administered 2018-01-09: 12 ug via INTRAVENOUS
  Administered 2018-01-09: 20 ug via INTRAVENOUS
  Administered 2018-01-09: 8 ug via INTRAVENOUS

## 2018-01-09 MED ORDER — KETOROLAC TROMETHAMINE 10 MG PO TABS: 10.0000 mg | ORAL_TABLET | Freq: Four times a day (QID) | ORAL | 0 refills | Status: DC | PRN

## 2018-01-09 MED ORDER — PROPOFOL 10 MG/ML IV BOLUS
INTRAVENOUS | Status: DC | PRN
  Administered 2018-01-09: 200 mg via INTRAVENOUS

## 2018-01-09 MED ORDER — PHENYLEPHRINE 40 MCG/ML (10ML) SYRINGE FOR IV PUSH (FOR BLOOD PRESSURE SUPPORT)
PREFILLED_SYRINGE | INTRAVENOUS | Status: AC
  Filled 2018-01-09: qty 10

## 2018-01-09 MED ORDER — FENTANYL CITRATE (PF) 100 MCG/2ML IJ SOLN
INTRAMUSCULAR | Status: DC | PRN
  Administered 2018-01-09 (×2): 50 ug via INTRAVENOUS

## 2018-01-09 SURGICAL SUPPLY — 24 items
BAG DRAIN URO-CYSTO SKYTR STRL (DRAIN) ×2 IMPLANT
BASKET STONE 1.7 NGAGE (UROLOGICAL SUPPLIES) IMPLANT
BASKET ZERO TIP NITINOL 2.4FR (BASKET) ×2 IMPLANT
BENZOIN TINCTURE PRP APPL 2/3 (GAUZE/BANDAGES/DRESSINGS) IMPLANT
CATH URET 5FR 28IN OPEN ENDED (CATHETERS) IMPLANT
CLOTH BEACON ORANGE TIMEOUT ST (SAFETY) ×2 IMPLANT
FIBER LASER FLEXIVA 365 (UROLOGICAL SUPPLIES) IMPLANT
FIBER LASER TRAC TIP (UROLOGICAL SUPPLIES) IMPLANT
GLOVE BIO SURGEON STRL SZ7.5 (GLOVE) ×2 IMPLANT
GOWN STRL REUS W/TWL XL LVL3 (GOWN DISPOSABLE) ×2 IMPLANT
GUIDEWIRE STR DUAL SENSOR (WIRE) IMPLANT
GUIDEWIRE ZIPWRE .038 STRAIGHT (WIRE) ×4 IMPLANT
IV NS 1000ML (IV SOLUTION)
IV NS 1000ML BAXH (IV SOLUTION) IMPLANT
IV NS IRRIG 3000ML ARTHROMATIC (IV SOLUTION) ×2 IMPLANT
KIT TURNOVER CYSTO (KITS) ×2 IMPLANT
MANIFOLD NEPTUNE II (INSTRUMENTS) ×2 IMPLANT
NS IRRIG 500ML POUR BTL (IV SOLUTION) ×4 IMPLANT
PACK CYSTO (CUSTOM PROCEDURE TRAY) ×2 IMPLANT
STENT URET 6FRX24 CONTOUR (STENTS) ×2 IMPLANT
STRIP CLOSURE SKIN 1/2X4 (GAUZE/BANDAGES/DRESSINGS) IMPLANT
SYR 10ML LL (SYRINGE) ×2 IMPLANT
TUBE CONNECTING 12X1/4 (SUCTIONS) IMPLANT
TUBING UROLOGY SET (TUBING) ×2 IMPLANT

## 2018-01-09 NOTE — Anesthesia Procedure Notes (Signed)
Procedure Name: LMA Insertion Date/Time: 01/09/2018 9:35 AM Performed by: Tyrone Nine, CRNA Pre-anesthesia Checklist: Timeout performed, Patient identified, Emergency Drugs available, Suction available and Patient being monitored Patient Re-evaluated:Patient Re-evaluated prior to induction Oxygen Delivery Method: Circle system utilized Preoxygenation: Pre-oxygenation with 100% oxygen Induction Type: IV induction Ventilation: Mask ventilation without difficulty LMA: LMA inserted LMA Size: 4.0 Number of attempts: 1 Placement Confirmation: CO2 detector,  positive ETCO2 and breath sounds checked- equal and bilateral Tube secured with: Tape Dental Injury: Teeth and Oropharynx as per pre-operative assessment

## 2018-01-09 NOTE — Op Note (Signed)
Operative Note  Preoperative diagnosis:  1.  3mm right UVJ stone 2.  Right flank pain  Postoperative diagnosis: 1.  No evidence of right ureteral or renal calculus   Procedure(s): 1.  Cystoscopy with right ureteroscopy and right JJ stent placement 2.  Right retrograde pyelogram  Surgeon: Rhoderick Moody, MD  Assistants:  None  Anesthesia:  General  Complications:  None  EBL:  5 mL  Specimens: 1. None  Drains/Catheters: 1.  Right 6 French x 24 cm JJ stent with tether  Intraoperative findings:   1. No right ureteral or renal stones seen on ureteroscopy 2. Solitary right collecting system with no filling defects or dilation involving the right ureter or right renal pelvis seen on retrograde pyelogram  Indication:  Allison Richard is a 32 y.o. female with a 1 month history of right flank pain associated with right renal colic and hematuria.  She had a CT stone study on 12/05/2017 as well as one on 01/02/2018 that suggested a 3 mm right ureteral stone was potentially the source of her pain.  She is here today for cystoscopy and right ureteroscopy to address her stone.  The patient has been consented for the above procedures, voices understanding wishes to proceed.  Description of procedure:  After informed consent was obtained, the patient was brought to the operating room and general LMA anesthesia was administered. The patient was then placed in the dorsolithotomy position and prepped and draped in usual sterile fashion. A timeout was performed. A 23 French rigid cystoscope was then inserted into the urethral meatus and advanced into the bladder under direct vision. A complete bladder survey revealed no intravesical pathology.  A 5 French ureteral catheter was then inserted into the right ureteral orifice and a retrograde pyelogram was obtained, with the findings listed above.  A Glidewire was then advanced into the lumen of the ureteral catheter advanced up to the right  renal pelvis, under fluoroscopic guidance.  A semirigid ureteroscope was then advanced alongside the wire and into the distal aspects of the right ureter, identifying no evidence of an obstructing ureteral stone.  The semirigid ureteroscope was then exchanged for a flexible ureteroscope, which was advanced over the wire and up to the right renal pelvis.  Full inspection of the right renal pelvis and its associated calyces, again, revealed no evidence of a stone.  The flexible ureteroscope was then carefully retracted down the ureter, revealing no evidence of ureteral trauma or ureteral stone.    A 6 [place] by 24 cm JJ stent with a tether was then advanced over the wire and into position within the right collecting system, confirming placement via fluoroscopy.  The patient's bladder was drained.  She tolerated the procedure well and was transferred to the postanesthesia in stable condition.  Plan: The patient has been instructed to remove her right JJ stent at 6 AM on 01/14/2018 and follow-up in 6 weeks for a right renal ultrasound.

## 2018-01-09 NOTE — Transfer of Care (Signed)
Immediate Anesthesia Transfer of Care Note  Patient: Allison Richard  Procedure(s) Performed: CYSTOSCOPY/RETROGRADE/URETEROSCOPY/STENT PLACEMENT (Right )  Patient Location: PACU  Anesthesia Type:General  Level of Consciousness: awake, alert , oriented and patient cooperative  Airway & Oxygen Therapy: Patient Spontanous Breathing and Patient connected to nasal cannula oxygen  Post-op Assessment: Report given to RN and Post -op Vital signs reviewed and stable  Post vital signs: Reviewed and stable  Last Vitals:  Vitals Value Taken Time  BP 146/81 01/09/2018 10:19 AM  Temp    Pulse 62 01/09/2018 10:23 AM  Resp 9 01/09/2018 10:23 AM  SpO2 99 % 01/09/2018 10:23 AM  Vitals shown include unvalidated device data.  Last Pain:  Vitals:   01/09/18 0830  TempSrc: Oral         Complications: No apparent anesthesia complications

## 2018-01-09 NOTE — Discharge Instructions (Signed)

## 2018-01-09 NOTE — H&P (Signed)
Urology Preoperative H&P   Chief Complaint: Right flank pain  History of Present Illness: Allison Richard is a 32 y.o. female with a history of a 3 mm right distal ureteral calculus seen on CT from 12/05/2017 and was associated with right-sided flank pain and hematuria. No prior history of kidney stones.   Today, she reports right lower back pain that radiates into her right inguinal region associated with nocturia x2. She reports nausea with vomiting on 12/06/17. She denies interval fever/chills She has gone to multiple providers due to her pain and has received at least two Rxs for percocet from Sarah L. Weber, PA-C.   CT stone study (12/05/2017)- showed a 3 mm right distal ureteral calculus without hydronephrosis. The calcification is visible on her scout film.     Past Medical History:  Diagnosis Date  . Adopted   . Alcohol abuse   . Anxiety   . Bipolar 1 disorder (HCC)   . Depression   . Hepatitis C    01-07-2018 per pt was told mild cased, dx 2016, pcp keeps check on it because felt that it will resolve)  . Herpes   . History of abnormal cervical Pap smear   . History of alcohol abuse    01-07-2018 per pt does not drink much anymore since 2016,  now it's very rare with special occasion  . History of anemia    during pregnancy  . History of panic attacks   . History of seizure 2015   01-07-2018  per x1 seizure , caused by taking tramadol with an antidepressant,  no seizure since  . History of suicidal ideation   . History of suicide attempt 2016   cutting wrists  . Personal history of drug abuse (HCC)    01-07-2018  per pt last herion,cocaine, and cannibus 2016 (last drug screen 11/ 2017 in epic only positive for benzo's)  . Right ureteral stone     Past Surgical History:  Procedure Laterality Date  . CESAREAN SECTION  04/02/2007   @[facility]   . CESAREAN SECTION N/A 05/18/2016   Procedure: CESAREAN SECTION;  Surgeon: Mitchel Honour, DO;  Location: [facility];  Service:  Obstetrics;  Laterality: N/A;  . CLOSED REDUCTION METACARPAL WITH PERCUTANEOUS PINNING  2010   right hand  . FRACTURE SURGERY    . TONSILLECTOMY AND ADENOIDECTOMY  1997    Allergies:  Allergies  Allergen Reactions  . Tramadol Rash and Other (See Comments)    "caused a seizure when was taken with an antidepressant"    . Vicodin [Hydrocodone-Acetaminophen] Itching, Nausea And Vomiting, Rash and Other (See Comments)    Reaction:  Hallucinations and bad dreams   . Hydrocodone Other (See Comments)    "hallucinations and bad dreams, get sick"    Family History  Adopted: Yes  Problem Relation Age of Onset  . Bipolar disorder Brother     Social History:  reports that she quit smoking about 5 months ago. Her smoking use included cigarettes. She quit after 10.00 years of use. She has never used smokeless tobacco. She reports that she drank alcohol. She reports that she has current or past drug history. Drugs: Heroin, Cocaine, Marijuana, and Benzodiazepines.  ROS: A complete review of systems was performed.  All systems are negative except for pertinent findings as noted.  Physical Exam:  Vital signs in last 24 hours:   Constitutional:  Alert and oriented, No acute distress Cardiovascular: Regular rate and rhythm, No JVD Respiratory: Normal respiratory effort,  Lungs clear bilaterally GI: Abdomen is soft, nontender, nondistended, no abdominal masses GU: No CVA tenderness Lymphatic: No lymphadenopathy Neurologic: Grossly intact, no focal deficits Psychiatric: Normal mood and affect  Laboratory Data:  No results for input(s): WBC, HGB, HCT, PLT in the last 72 hours.  No results for input(s): NA, K, CL, GLUCOSE, BUN, CALCIUM, CREATININE in the last 72 hours.  Invalid input(s): CO3   No results found for this or any previous visit (from the past 24 hour(s)). No results found for this or any previous visit (from the past 240 hour(s)).  Renal Function: No results for input(s):  CREATININE in the last 168 hours. CrCl cannot be calculated (Patient's most recent lab result is older than the maximum 21 days allowed.).  Radiologic Imaging: No results found.  I independently reviewed the above imaging studies.  Assessment and Plan Allison Richard is a 32 y.o. female with a 3 mm right distal ureteral stone   The risks, benefits and alternatives of cystoscopy with RIGHT ureteroscopy, laser lithotripsy and ureteral stent placement was discussed the patient.  Risks included, but are not limited to: bleeding, urinary tract infection, ureteral injury/avulsion, ureteral stricture formation, retained stone fragments, the possibility that multiple surgeries may be required to treat the stone(s), MI, stroke, PE and the inherent risks of general anesthesia.  The patient voices understanding and wishes to proceed.     Rhoderick Moody, MD 01/09/2018, 6:59 AM  [facility] Pager: [phone]

## 2018-01-09 NOTE — Anesthesia Postprocedure Evaluation (Signed)
Anesthesia Post Note  Patient: Allison Richard  Procedure(s) Performed: CYSTOSCOPY/RETROGRADE/URETEROSCOPY/STENT PLACEMENT (Right )     Patient location during evaluation: PACU Anesthesia Type: General Level of consciousness: awake and alert Pain management: pain level controlled Vital Signs Assessment: post-procedure vital signs reviewed and stable Respiratory status: spontaneous breathing, nonlabored ventilation, respiratory function stable and patient connected to nasal cannula oxygen Cardiovascular status: blood pressure returned to baseline and stable Postop Assessment: no apparent nausea or vomiting Anesthetic complications: no    Last Vitals:  Vitals:   01/09/18 1110 01/09/18 1200  BP:  108/67  Pulse: 79 75  Resp: 18   Temp:  36.9 C  SpO2: 97% 98%    Last Pain:  Vitals:   01/09/18 1145  TempSrc:   PainSc: 3                  Trevor Iha

## 2018-01-10 ENCOUNTER — Encounter (HOSPITAL_BASED_OUTPATIENT_CLINIC_OR_DEPARTMENT_OTHER): Payer: Self-pay | Admitting: Urology

## 2018-01-14 ENCOUNTER — Encounter (HOSPITAL_COMMUNITY): Payer: Self-pay | Admitting: Emergency Medicine

## 2018-01-14 ENCOUNTER — Other Ambulatory Visit: Payer: Self-pay

## 2018-01-14 DIAGNOSIS — F419 Anxiety disorder, unspecified: Secondary | ICD-10-CM | POA: Diagnosis not present

## 2018-01-14 DIAGNOSIS — R1031 Right lower quadrant pain: Secondary | ICD-10-CM | POA: Diagnosis present

## 2018-01-14 DIAGNOSIS — Z79899 Other long term (current) drug therapy: Secondary | ICD-10-CM | POA: Diagnosis not present

## 2018-01-14 DIAGNOSIS — F319 Bipolar disorder, unspecified: Secondary | ICD-10-CM | POA: Diagnosis not present

## 2018-01-14 DIAGNOSIS — Z87891 Personal history of nicotine dependence: Secondary | ICD-10-CM | POA: Diagnosis not present

## 2018-01-14 DIAGNOSIS — F1010 Alcohol abuse, uncomplicated: Secondary | ICD-10-CM | POA: Insufficient documentation

## 2018-01-14 LAB — URINALYSIS, ROUTINE W REFLEX MICROSCOPIC
Bilirubin Urine: NEGATIVE
Glucose, UA: NEGATIVE mg/dL
KETONES UR: NEGATIVE mg/dL
Leukocytes, UA: NEGATIVE
Nitrite: NEGATIVE
PROTEIN: 100 mg/dL — AB
Specific Gravity, Urine: 1.021 (ref 1.005–1.030)
pH: 5 (ref 5.0–8.0)

## 2018-01-14 LAB — I-STAT BETA HCG BLOOD, ED (MC, WL, AP ONLY)

## 2018-01-14 NOTE — ED Triage Notes (Signed)
Pt reports having kidney stent on right kidney on 01/09/18 and then today the stent was removed and now having difficulty urinating and feeling like the urine can not come out.

## 2018-01-15 ENCOUNTER — Emergency Department (HOSPITAL_COMMUNITY)
Admission: EM | Admit: 2018-01-15 | Discharge: 2018-01-15 | Disposition: A | Discharge status: Home / Self Care | Attending: Emergency Medicine | Admitting: Emergency Medicine

## 2018-01-15 ENCOUNTER — Emergency Department (HOSPITAL_COMMUNITY)

## 2018-01-15 DIAGNOSIS — R109 Unspecified abdominal pain: Secondary | ICD-10-CM

## 2018-01-15 LAB — CBC WITH DIFFERENTIAL/PLATELET
Abs Immature Granulocytes: 0.02 10*3/uL (ref 0.00–0.07)
Basophils Absolute: 0 10*3/uL (ref 0.0–0.1)
Basophils Relative: 1 %
Eosinophils Absolute: 0.2 10*3/uL (ref 0.0–0.5)
Eosinophils Relative: 3 %
HEMATOCRIT: 37.6 % (ref 36.0–46.0)
Hemoglobin: 12.2 g/dL (ref 12.0–15.0)
Immature Granulocytes: 0 %
LYMPHS PCT: 34 %
Lymphs Abs: 2.2 10*3/uL (ref 0.7–4.0)
MCH: 28.9 pg (ref 26.0–34.0)
MCHC: 32.4 g/dL (ref 30.0–36.0)
MCV: 89.1 fL (ref 80.0–100.0)
MONO ABS: 0.6 10*3/uL (ref 0.1–1.0)
MONOS PCT: 9 %
NEUTROS ABS: 3.5 10*3/uL (ref 1.7–7.7)
NEUTROS PCT: 53 %
Platelets: 224 10*3/uL (ref 150–400)
RBC: 4.22 MIL/uL (ref 3.87–5.11)
RDW: 13 % (ref 11.5–15.5)
WBC: 6.5 10*3/uL (ref 4.0–10.5)
nRBC: 0 % (ref 0.0–0.2)

## 2018-01-15 LAB — I-STAT CHEM 8, ED
BUN: 16 mg/dL (ref 6–20)
CHLORIDE: 103 mmol/L (ref 98–111)
CREATININE: 1 mg/dL (ref 0.44–1.00)
Calcium, Ion: 1.16 mmol/L (ref 1.15–1.40)
GLUCOSE: 85 mg/dL (ref 70–99)
HEMATOCRIT: 38 % (ref 36.0–46.0)
Hemoglobin: 12.9 g/dL (ref 12.0–15.0)
Potassium: 3.9 mmol/L (ref 3.5–5.1)
Sodium: 139 mmol/L (ref 135–145)
TCO2: 26 mmol/L (ref 22–32)

## 2018-01-15 MED ORDER — MORPHINE SULFATE (PF) 4 MG/ML IV SOLN
4.0000 mg | Freq: Once | INTRAVENOUS | Status: AC
  Administered 2018-01-15: 4 mg via INTRAVENOUS
  Filled 2018-01-15: qty 1

## 2018-01-15 MED ORDER — KETOROLAC TROMETHAMINE 30 MG/ML IJ SOLN
30.0000 mg | Freq: Once | INTRAMUSCULAR | Status: AC
  Administered 2018-01-15: 30 mg via INTRAVENOUS
  Filled 2018-01-15: qty 1

## 2018-01-15 NOTE — ED Provider Notes (Signed)
[facility]-EMERGENCY DEPT Provider Note   CSN: [id number] Arrival date & time: 01/14/18  2220     History   Chief Complaint Chief Complaint  Patient presents with  . Flank Pain    HPI Allison Richard is a 32 y.o. female.   32 year old female presents to the emergency department for evaluation of right-sided flank pain and abdominal pain.  She has been having ongoing pain over the past month and a similar location.  Was presumed to have a 3 mm kidney stone.  Underwent ureteral stent placement on 01/09/2018 by Dr. Liliane Shi of urology.  States that her pain was fairly well controlled with outpatient Toradol and Percocet.  Her mother removed her stent earlier today (states mother works as a Engineer, civil (consulting)) and patient began to feel worsening pain in her right flank as well as to her right lower abdomen and across to her suprapubic region.  She reports urinary urgency as well as a feeling of incomplete bladder emptying.  She states that her pain feels similar to previous flank pain, but was not relieved with her home Percocet and Toradol.  She denies any fevers.  No vomiting today, dysuria, voiding clots.     Past Medical History:  Diagnosis Date  . Adopted   . Alcohol abuse   . Anxiety   . Bipolar 1 disorder (HCC)   . Depression   . Hepatitis C    01-07-2018 per pt was told mild cased, dx 2016, pcp keeps check on it because felt that it will resolve)  . Herpes   . History of abnormal cervical Pap smear   . History of alcohol abuse    01-07-2018 per pt does not drink much anymore since 2016,  now it's very rare with special occasion  . History of anemia    during pregnancy  . History of panic attacks   . History of seizure 2015   01-07-2018  per x1 seizure , caused by taking tramadol with an antidepressant,  no seizure since  . History of suicidal ideation   . History of suicide attempt 2016   cutting wrists  . Personal history of drug abuse (HCC)    01-07-2018  per pt last herion,cocaine, and cannibus 2016 (last drug screen 11/ 2017 in epic only positive for benzo's)  . Right ureteral stone     Patient Active Problem List   Diagnosis Date Noted  . S/P cesarean section 05/18/2016  . 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 Chest pain 11/04/2014  . Seizures (HCC) 12/22/2013  . Anxiety state 12/22/2013  . Depression 12/22/2013  . Depression with anxiety 12/08/2011  . Acne 12/08/2011  . Insomnia 12/08/2011    Past Surgical History:  Procedure Laterality Date  . CESAREAN SECTION  04/02/2007   @[facility]   . CESAREAN SECTION N/A 05/18/2016   Procedure: CESAREAN SECTION;  Surgeon: Mitchel Honour, DO;  Location: [facility];  Service: Obstetrics;  Laterality: N/A;  . CLOSED REDUCTION METACARPAL WITH PERCUTANEOUS PINNING  2010   right hand  . CYSTOSCOPY/URETEROSCOPY/HOLMIUM LASER/STENT PLACEMENT Right 01/09/2018   Procedure: CYSTOSCOPY/RETROGRADE/URETEROSCOPY/STENT PLACEMENT;  Surgeon: Rene Paci, MD;  Location: [facility];  Service: Urology;  Laterality: Right;  ONLY NEEDS 45 MIN  . FRACTURE SURGERY    . TONSILLECTOMY AND ADENOIDECTOMY  1997     OB History    Gravida  3   Para  2   Term  2   Preterm      AB  1  Living  2     SAB  1   TAB      Ectopic      Multiple  0   Live Births  2            Home Medications    Prior to Admission medications   Medication Sig Start Date End Date Taking? Authorizing Provider  acetaminophen (TYLENOL) 500 MG tablet Take 1,000 mg by mouth every 6 (six) hours as needed for moderate pain or headache.    Yes [provider]  albuterol (PROVENTIL HFA;VENTOLIN HFA) 108 (90 Base) MCG/ACT inhaler Inhale 2 puffs into the lungs every 6 (six) hours as needed for wheezing or shortness of breath. 07/26/15  Yes Tonye Pearson, MD  ALPRAZolam Prudy Feeler) 1 MG tablet Take 1-2 tablets (1-2 mg total) by mouth 2 (two) times daily as needed for anxiety. 12/06/17  Yes  Weber, Sarah L, PA-C  cyclobenzaprine (FLEXERIL) 10 MG tablet Take 1 tablet (10 mg total) by mouth 3 (three) times daily as needed for muscle spasms. 12/06/17  Yes Weber, Sarah L, PA-C  escitalopram (LEXAPRO) 20 MG tablet Take 1.5 tablets (30 mg total) by mouth daily. Patient taking differently: Take 20 mg by mouth every morning.  12/06/17  Yes Weber, Sarah L, PA-C  ketorolac (TORADOL) 10 MG tablet Take 1 tablet (10 mg total) by mouth every 6 (six) hours as needed. Patient taking differently: Take 10 mg by mouth every 6 (six) hours as needed for moderate pain or severe pain.  01/09/18  Yes Rene Paci, MD  naproxen (NAPROSYN) 500 MG tablet Take 1 tablet (500 mg total) by mouth 2 (two) times daily. Patient taking differently: Take 500 mg by mouth 2 (two) times daily as needed for mild pain or moderate pain.  12/06/17  Yes Maczis, Elmer Sow, PA-C  ondansetron (ZOFRAN) 4 MG tablet Take 1 tablet (4 mg total) by mouth daily as needed for nausea or vomiting. 01/09/18 01/09/19 Yes Rene Paci, MD  oxybutynin (DITROPAN) 5 MG tablet Take 1 tablet (5 mg total) by mouth every 8 (eight) hours as needed for bladder spasms. 01/09/18  Yes Rene Paci, MD  oxyCODONE-acetaminophen (PERCOCET/ROXICET) 5-325 MG tablet Take 1-2 tablets by mouth every 4 (four) hours as needed for severe pain. 01/09/18  Yes Rene Paci, MD  phenazopyridine (PYRIDIUM) 200 MG tablet Take 1 tablet (200 mg total) by mouth 3 (three) times daily as needed (for pain with urination). 01/09/18 01/09/19 Yes Rene Paci, MD  QUEtiapine (SEROQUEL) 300 MG tablet Take 1 tablet (300 mg total) by mouth at bedtime. Patient taking differently: Take 300 mg by mouth at bedtime.  12/06/17  Yes Weber, Dema Severin, PA-C    Family History Family History  Adopted: Yes  Problem Relation Age of Onset  . Bipolar disorder Brother     Social History Social History   Tobacco Use  . Smoking status:  Former Smoker    Years: 10.00    Types: Cigarettes    Last attempt to quit: 07/28/2017    Years since quitting: 0.4  . Smokeless tobacco: Never Used  . Tobacco comment: 01-07-2018 pt starts and stops smoking at times currently states not smoked since 05/ 2019  Substance Use Topics  . Alcohol use: Not Currently    Alcohol/week: 0.0 standard drinks    Comment: hx alcohol abuse--  01-07-2018 stated very rare for special occasion  . Drug use: Not Currently    Types: Heroin, Cocaine, Marijuana,  Benzodiazepines    Comment: 01-07-2018  hx heroin, cocaine abuse, and marijuana per pt last used 2016     Allergies   Tramadol; Vicodin [hydrocodone-acetaminophen]; and Hydrocodone   Review of Systems Review of Systems Ten systems reviewed and are negative for acute change, except as noted in the HPI.    Physical Exam Updated Vital Signs BP 122/71 (BP Location: Left Arm)   Pulse 87   Temp 98.6 F (37 C) (Oral)   Resp 16   Ht 5\' 4"  (1.626 m)   Wt 80 kg   LMP 01/03/2018   SpO2 98%   BMI 30.27 kg/m   Physical Exam  Constitutional: She is oriented to person, place, and time. She appears well-developed and well-nourished. No distress.  Nontoxic appearing and in NAD  HENT:  Head: Normocephalic and atraumatic.  Eyes: Conjunctivae and EOM are normal. No scleral icterus.  Neck: Normal range of motion.  Cardiovascular: Normal rate, regular rhythm and intact distal pulses.  Pulmonary/Chest: Effort normal. No stridor. No respiratory distress.  Respirations even and unlabored  Abdominal: She exhibits no mass. There is tenderness. There is no guarding.  Obese abdomen.  Normoactive bowel sounds.  Mild tenderness in the right mid abdomen.  No peritoneal signs.  Genitourinary:  Genitourinary Comments: PVR bladder scan with 30cc  Musculoskeletal: Normal range of motion.  Neurological: She is alert and oriented to person, place, and time. She exhibits normal muscle tone. Coordination normal.    Skin: Skin is warm and dry. No rash noted. She is not diaphoretic. No erythema. No pallor.  Psychiatric: She has a normal mood and affect. Her behavior is normal.  Nursing note and vitals reviewed.    ED Treatments / Results  Labs (all labs ordered are listed, but only abnormal results are displayed) Labs Reviewed  URINALYSIS, ROUTINE W REFLEX MICROSCOPIC - Abnormal; Notable for the following components:      Result Value   Color, Urine AMBER (*)    APPearance CLOUDY (*)    Hgb urine dipstick LARGE (*)    Protein, ur 100 (*)    RBC / HPF >50 (*)    Bacteria, UA RARE (*)    All other components within normal limits  CBC WITH DIFFERENTIAL/PLATELET  I-STAT BETA HCG BLOOD, ED ([facility], [facility], [facility] ONLY)  I-STAT CHEM 8, ED    EKG None  Radiology US Renal  Result Date: 01/15/2018 CLINICAL DATA:  Right flank pain. Cystoscopy with stent placement 01/09/2018, removal of stent today EXAM: RENAL / URINARY TRACT ULTRASOUND COMPLETE COMPARISON:  CT 01/02/2018 FINDINGS: Right Kidney: Length: 11.7 cm. Mild hydronephrosis. No shadowing calculi. Renal echogenicity is normal. No focal mass. Left Kidney: Length: 10.5 cm. Echogenicity within normal limits. No shadowing calculi. No mass or hydronephrosis visualized. Bladder: Partially distended. Slight bladder wall thickening the region of the right ureteral orifice. Right ureteral jet not visualized. The left ureteral jet is seen. IMPRESSION: 1. Mild right hydronephrosis, absent right ureteral jet. 2. Slight bladder wall thickening near the right ureteral orifice. This may be irritation secondary to recent stent placement. Electronically Signed   By: Narda Rutherford M.D.   On: 01/15/2018 02:43    Procedures Procedures (including critical care time)  Medications Ordered in ED Medications  ketorolac (TORADOL) 30 MG/ML injection 30 mg (30 mg Intravenous Given 01/15/18 0207)  morphine 4 MG/ML injection 4 mg (4 mg Intravenous Given 01/15/18 0207)    3:55  AM Patient reassessed.  She states that her pain is significantly improved  following Toradol and morphine.  She is resting comfortably, in no distress.  Work-up in the emergency department was reviewed with the patient.  She verbalizes understanding of results.   Initial Impression / Assessment and Plan / ED Course  I have reviewed the triage vital signs and the nursing notes.  Pertinent labs & imaging results that were available during my care of the patient were reviewed by me and considered in my medical decision making (see chart for details).     32 year old female presents to the emergency department for evaluation of persistent right flank pain.  She was initially seen for these complaints in September.  Was thought to have a 3 mm stone on CT at that time.  Followed up with urology as an outpatient with stent placement on 01/09/2018.  She was doing well until stent removal by her mother today.  Patient expressing urinary urgency as well as sensation of incomplete bladder emptying.  She has a postvoid residual of 30 cc urine in the bladder.  Her urinalysis today does not suggest infection.  Hematuria to be expected given stent removal.  Her laboratory work-up is reassuring without leukocytosis or electrolyte derangements.  Her kidney function is preserved.  Pregnancy is negative.  Discussed with patient that her renal ultrasound today shows mild right hydronephrosis.  Of note, ureteral jet was absent on the right.  This may be from temporary obstruction due to ureteral spasm.  Recent ureteroscopy was WITHOUT evidence of any retained stone.  Slight bladder wall thickening is favored to be secondary to irritation from recent stent placement, possibly also from removal.   She has had symptomatic improvement in the emergency department following 1 dose of morphine as well as Toradol.  Given chronicity of waxing and waning symptoms, I believe she is stable for continued outpatient follow-up.  The  patient has been instructed to discuss her symptoms with her urologist.  Case was discussed between Dr. Liliane Shi and Dr. Rhunette Croft.  Dr. Liliane Shi encourages patient to also look into other causes of her ongoing flank pain.  I discussed with the patient her need for follow-up with her primary doctor for this reason.  Return precautions discussed and provided. Patient discharged in stable condition with no unaddressed concerns.   Final Clinical Impressions(s) / ED Diagnoses   Final diagnoses:  Right flank pain    ED Discharge Orders    None       Antony Madura, PA-C 01/15/18 0426    Derwood Kaplan, MD 01/15/18 [id number]

## 2018-01-15 NOTE — Discharge Instructions (Addendum)
Your work-up in the emergency department today was reassuring.  Your urine showed some blood, but this is to be expected given your recent stent removal.  We encourage you to continue home use of Toradol and your prescribed pain medication.  He would also benefit to continue oxybutynin as previously prescribed.  Follow-up with Dr. Liliane Shi as scheduled for recheck.  You would also benefit from follow-up with your primary care doctor to explore other possible causes of your pain.  Your prior CAT scan in September was reviewed and did not reveal any evidence of other emergent process.  You may return to the ED for new or concerning symptoms.

## 2018-02-11 ENCOUNTER — Telehealth: Payer: Self-pay | Admitting: Family Medicine

## 2018-02-11 NOTE — Telephone Encounter (Signed)
CALLED PATIENT TO SEE IF SHE STILL WANTED THE REFERRAL FOR [facility] THAT [name] PUT IN BACK IN September DR Evelene CroonKAUR WAS SCHEDULING 4-5 MONTHS OUT

## 2018-02-11 NOTE — Telephone Encounter (Signed)
She is actually seeing Benny LennertSarah Weber at her new employment, please close this referral thanks

## 2019-01-03 IMAGING — US US RENAL
1 series · 14 of 25 positions shown · non-contrast
Comparison: CT 01/02/2018

CLINICAL DATA: Right flank pain. Cystoscopy with stent placement
01/09/2018, removal of stent today

EXAM:
RENAL / URINARY TRACT ULTRASOUND COMPLETE

[Series 1: us renal · 77 acquisitions, 14 frames shown]
[im 1/77]
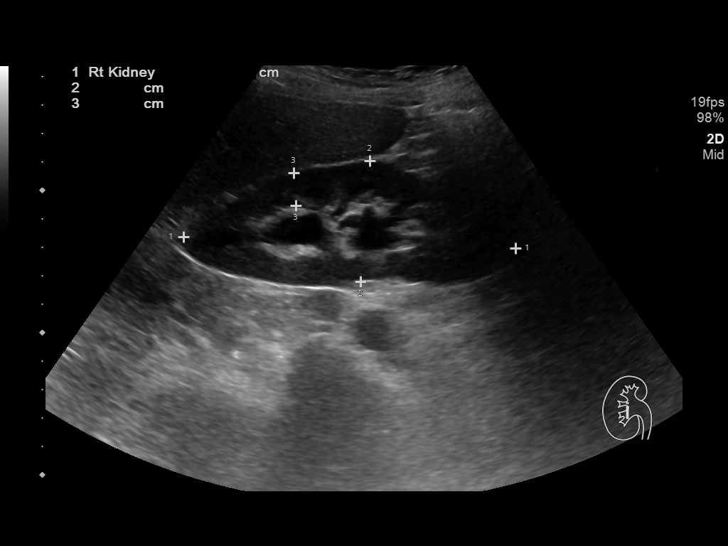
[im 7/77]
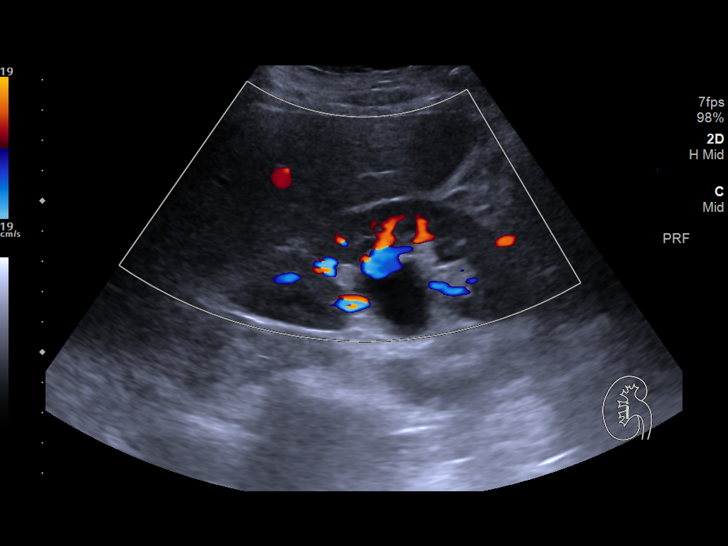
[im 13/77]
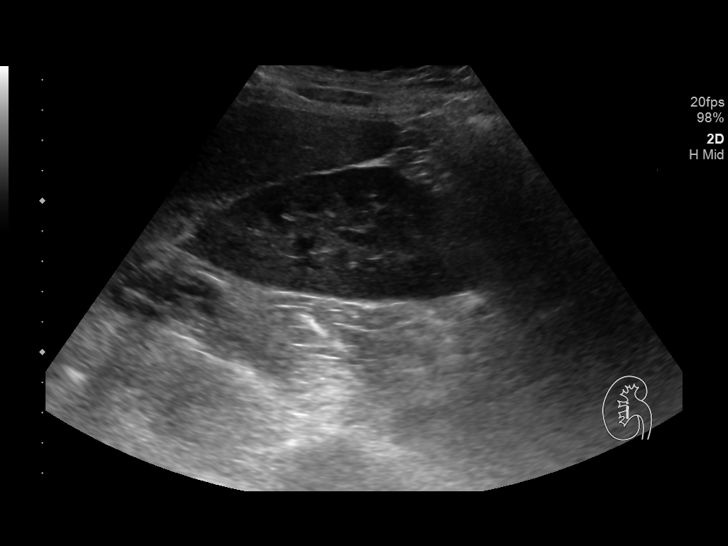
[im 20/77]
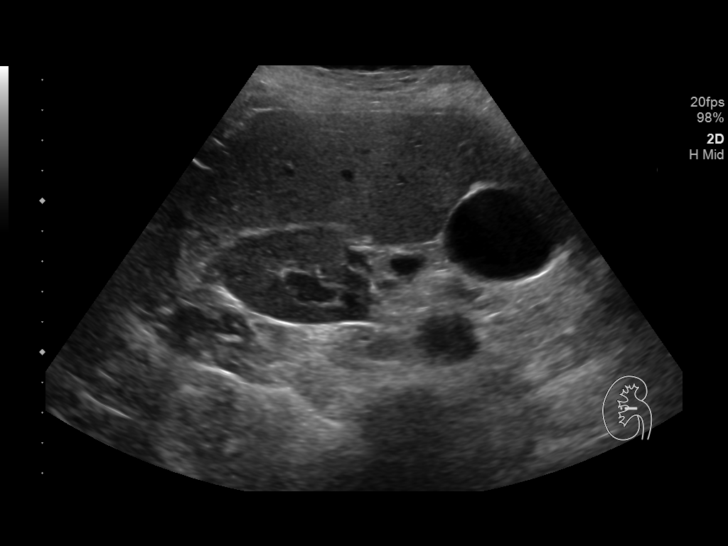
[im 26/77]
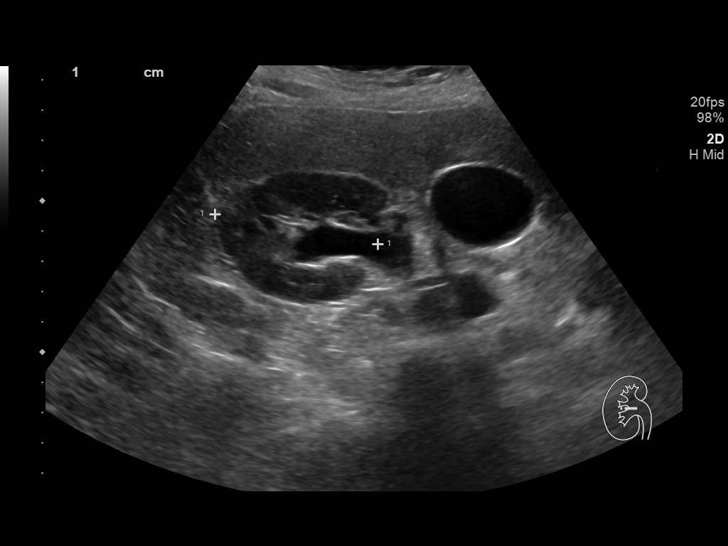
[im 29/77]
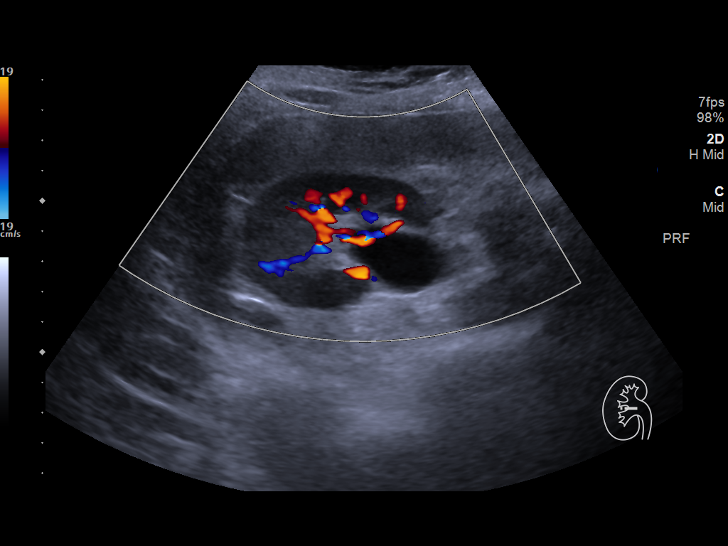
[im 35/77]
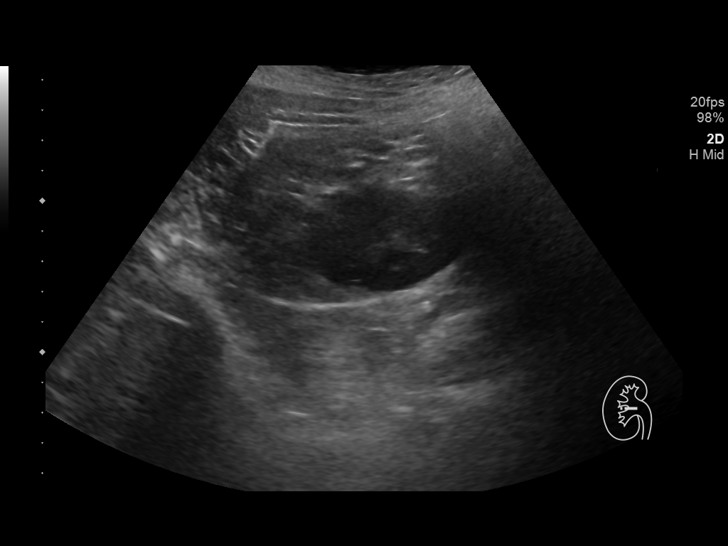
[im 42/77]
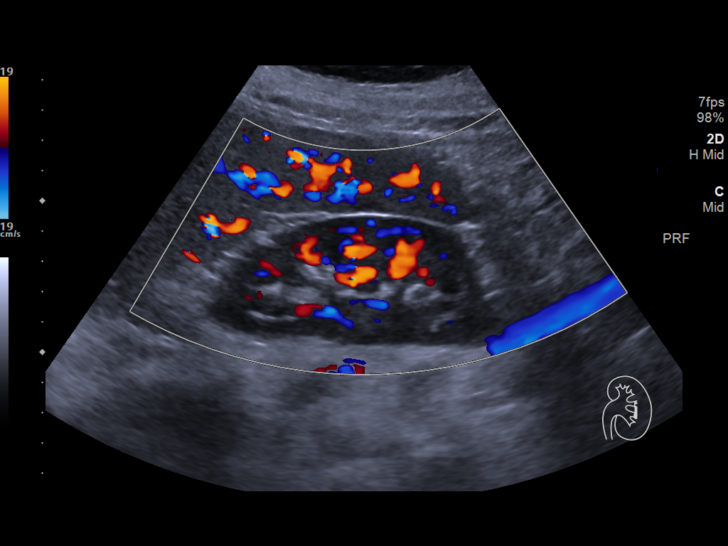
[im 48/77]
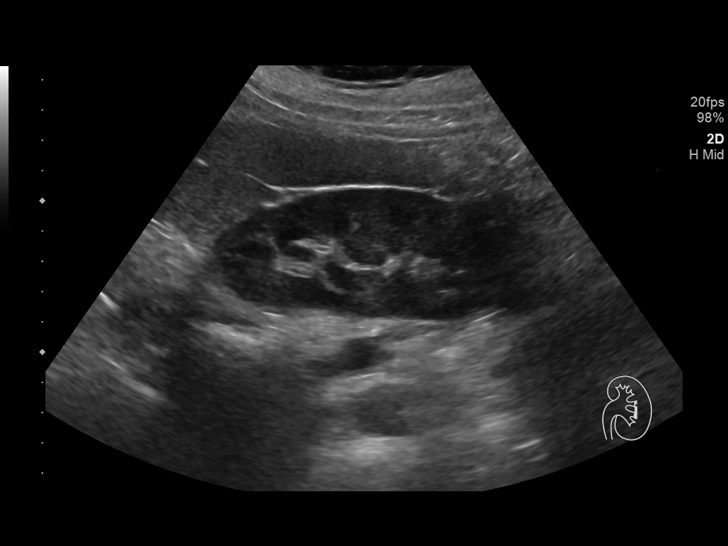
[im 51/77]
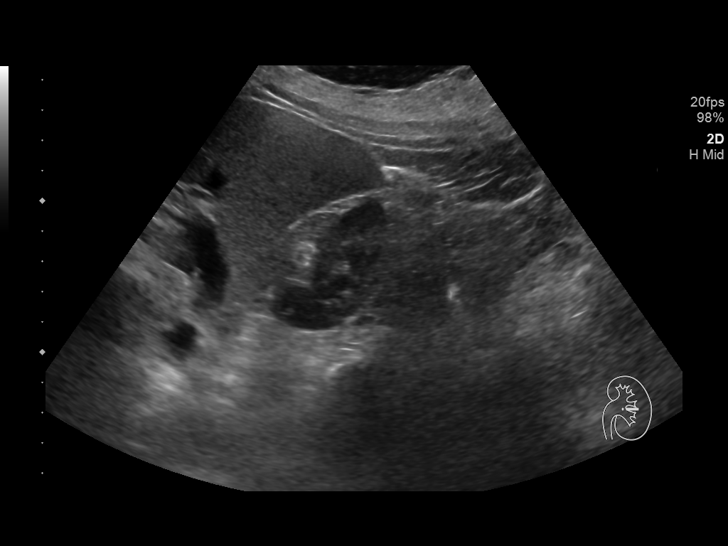
[im 58/77]
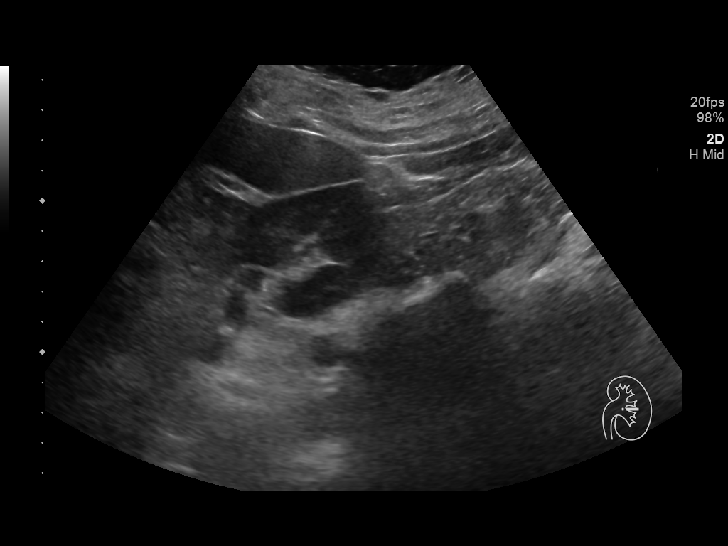
[im 64/77]
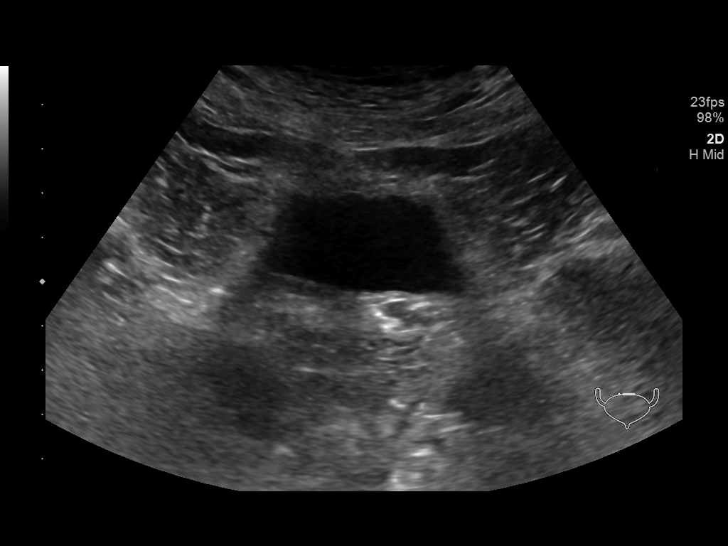
[im 70/77]
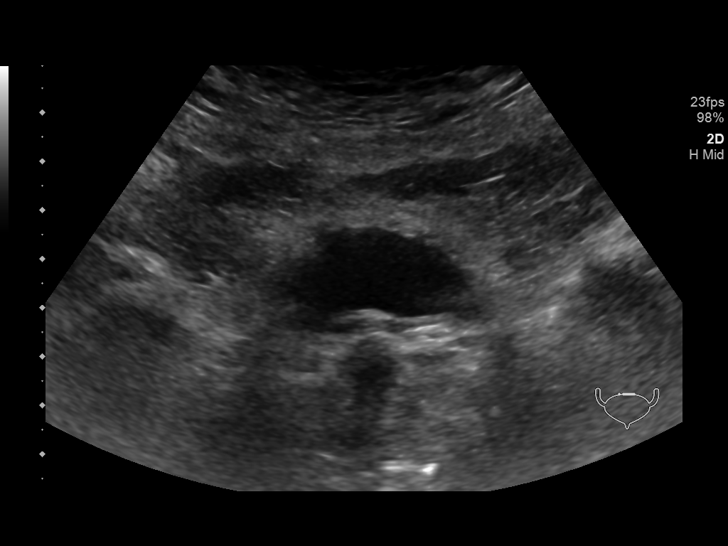
[im 77/77]
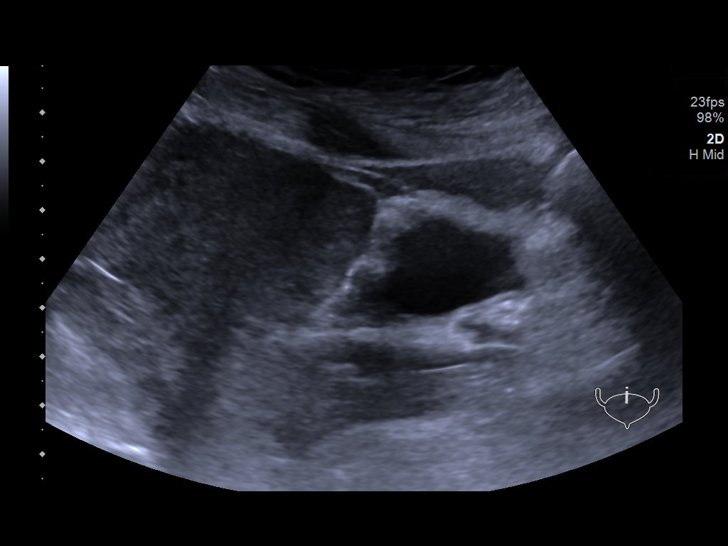

[14 of 25 positions shown; findings below may reference images not displayed]

FINDINGS: Right Kidney:

Length: 11.7 cm. Mild hydronephrosis. No shadowing calculi. Renal
echogenicity is normal. No focal mass.

Left Kidney:

Length: 10.5 cm. Echogenicity within normal limits. No shadowing
calculi. No mass or hydronephrosis visualized.

Bladder:

Partially distended. Slight bladder wall thickening the region of
the right ureteral orifice. Right ureteral jet not visualized. The
left ureteral jet is seen.
IMPRESSION: 1. Mild right hydronephrosis, absent right ureteral jet.
2. Slight bladder wall thickening near the right ureteral orifice.
This may be irritation secondary to recent stent placement.

## 2022-12-28 ENCOUNTER — Encounter (HOSPITAL_COMMUNITY): Payer: Self-pay

## 2022-12-28 ENCOUNTER — Emergency Department (HOSPITAL_COMMUNITY)
Admission: EM | Admit: 2022-12-28 | Discharge: 2022-12-29 | Disposition: A | Discharge status: Home / Self Care | Attending: Emergency Medicine | Admitting: Emergency Medicine

## 2022-12-28 DIAGNOSIS — M545 Low back pain, unspecified: Secondary | ICD-10-CM | POA: Diagnosis present

## 2022-12-28 LAB — CBC WITH DIFFERENTIAL/PLATELET
Abs Immature Granulocytes: 0.01 10*3/uL (ref 0.00–0.07)
Basophils Absolute: 0 10*3/uL (ref 0.0–0.1)
Basophils Relative: 1 %
Eosinophils Absolute: 0.1 10*3/uL (ref 0.0–0.5)
Eosinophils Relative: 1 %
HCT: 36 % (ref 36.0–46.0)
Hemoglobin: 11.6 g/dL — ABNORMAL LOW (ref 12.0–15.0)
Immature Granulocytes: 0 %
Lymphocytes Relative: 44 %
Lymphs Abs: 2.5 10*3/uL (ref 0.7–4.0)
MCH: 27.3 pg (ref 26.0–34.0)
MCHC: 32.2 g/dL (ref 30.0–36.0)
MCV: 84.7 fL (ref 80.0–100.0)
Monocytes Absolute: 0.6 10*3/uL (ref 0.1–1.0)
Monocytes Relative: 10 %
Neutro Abs: 2.6 10*3/uL (ref 1.7–7.7)
Neutrophils Relative %: 44 %
Platelets: 189 10*3/uL (ref 150–400)
RBC: 4.25 MIL/uL (ref 3.87–5.11)
RDW: 13.9 % (ref 11.5–15.5)
WBC: 5.8 10*3/uL (ref 4.0–10.5)
nRBC: 0 % (ref 0.0–0.2)

## 2022-12-28 LAB — URINALYSIS, ROUTINE W REFLEX MICROSCOPIC
Bilirubin Urine: NEGATIVE
Glucose, UA: NEGATIVE mg/dL
Hgb urine dipstick: NEGATIVE
Ketones, ur: NEGATIVE mg/dL
Nitrite: NEGATIVE
Protein, ur: NEGATIVE mg/dL
Specific Gravity, Urine: 1.024 (ref 1.005–1.030)
pH: 6 (ref 5.0–8.0)

## 2022-12-28 LAB — COMPREHENSIVE METABOLIC PANEL
ALT: 14 U/L (ref 0–44)
AST: 15 U/L (ref 15–41)
Albumin: 4.2 g/dL (ref 3.5–5.0)
Alkaline Phosphatase: 53 U/L (ref 38–126)
Anion gap: 9 (ref 5–15)
BUN: 13 mg/dL (ref 6–20)
CO2: 25 mmol/L (ref 22–32)
Calcium: 8.9 mg/dL (ref 8.9–10.3)
Chloride: 103 mmol/L (ref 98–111)
Creatinine, Ser: 1.04 mg/dL — ABNORMAL HIGH (ref 0.44–1.00)
GFR, Estimated: >60 mL/min (ref >60)
Glucose, Bld: 86 mg/dL (ref 70–99)
Potassium: 3.7 mmol/L (ref 3.5–5.1)
Sodium: 137 mmol/L (ref 135–145)
Total Bilirubin: 0.3 mg/dL (ref 0.3–1.2)
Total Protein: 7.8 g/dL (ref 6.5–8.1)

## 2022-12-28 LAB — LIPASE, BLOOD: Lipase: 33 U/L (ref 11–51)

## 2022-12-28 MED ORDER — FENTANYL CITRATE PF 50 MCG/ML IJ SOSY
50.0000 ug | PREFILLED_SYRINGE | Freq: Once | INTRAMUSCULAR | Status: AC
  Administered 2022-12-28: 50 ug via INTRAVENOUS
  Filled 2022-12-28: qty 1

## 2022-12-28 MED ORDER — KETOROLAC TROMETHAMINE 15 MG/ML IJ SOLN
15.0000 mg | Freq: Once | INTRAMUSCULAR | Status: AC
  Administered 2022-12-28: 15 mg via INTRAVENOUS
  Filled 2022-12-28: qty 1

## 2022-12-28 MED ORDER — ONDANSETRON HCL 4 MG/2ML IJ SOLN
4.0000 mg | Freq: Once | INTRAMUSCULAR | Status: AC
  Administered 2022-12-28: 4 mg via INTRAVENOUS
  Filled 2022-12-28: qty 2
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
## 2022-12-29 ENCOUNTER — Emergency Department (HOSPITAL_COMMUNITY)

## 2022-12-29 LAB — PREGNANCY, URINE: Preg Test, Ur: NEGATIVE

## 2022-12-29 MED ORDER — MORPHINE SULFATE (PF) 4 MG/ML IV SOLN
4.0000 mg | Freq: Once | INTRAVENOUS | Status: AC
  Administered 2022-12-29: 4 mg via INTRAVENOUS
  Filled 2022-12-29: qty 1

## 2022-12-29 NOTE — Discharge Instructions (Addendum)
Your workup tonight was reassuring.  Please follow-up with gynecology and primary care as needed for further evaluation of your pain.  If you develop new life any symptoms please return to the emergency department.

## 2022-12-29 NOTE — ED Notes (Addendum)
10:30 PM  Patient is alert and oriented x 4. She has equal rise and fall of the chest wall with clear lung sounds. Patient reports right flank pain radiating across abdomen to left flank. Patient rates pain 9/10 on pain scale. She endorses nausea and vomiting, but denies urinary sx. She states that symptoms started earlier this morning and progressively worsened. Patient is assisted to restroom. Urine sample collected and sent to lab. IV started and labs drawn and sent to lab. Patient medicated per order. Bed in lowest position. Pending CT scans and lab results.   2:14 AM  Patient transported to CT.

## 2023-04-28 ENCOUNTER — Other Ambulatory Visit: Payer: Self-pay

## 2023-04-28 ENCOUNTER — Encounter (HOSPITAL_COMMUNITY): Payer: Self-pay | Admitting: *Deleted

## 2023-04-28 ENCOUNTER — Emergency Department (HOSPITAL_COMMUNITY)
Admission: EM | Admit: 2023-04-28 | Discharge: 2023-04-29 | Disposition: A | Discharge status: Home / Self Care | Attending: Emergency Medicine | Admitting: Emergency Medicine

## 2023-04-28 DIAGNOSIS — R110 Nausea: Secondary | ICD-10-CM | POA: Insufficient documentation

## 2023-04-28 DIAGNOSIS — N3001 Acute cystitis with hematuria: Secondary | ICD-10-CM | POA: Insufficient documentation

## 2023-04-28 MED ORDER — KETOROLAC TROMETHAMINE 30 MG/ML IJ SOLN
15.0000 mg | Freq: Once | INTRAMUSCULAR | Status: AC
  Administered 2023-04-28: 15 mg via INTRAVENOUS
  Filled 2023-04-28: qty 1

## 2023-04-28 NOTE — ED Provider Triage Note (Signed)
Emergency Medicine Provider Triage Evaluation Note  Allison Richard , a 38 y.o. female  was evaluated in triage.  Pt complains of dysuria.  Review of Systems  Positive: Flank pain (bil), dysuria x 3 days, hematuria, nausea Negative: fever  Physical Exam  BP 121/80   Pulse 88   Temp 98.5 F (36.9 C)   Resp 16   LMP 04/21/2023   SpO2 98%  Gen:   Awake, no distress   Resp:  Normal effort  MSK:   Moves extremities without difficulty  Other:    Medical Decision Making  Medically screening exam initiated at 10:31 PM.  Appropriate orders placed.  Allison Richard was informed that the remainder of the evaluation will be completed by another provider, this initial triage assessment does not replace that evaluation, and the importance of remaining in the ED until their evaluation is complete.  3 days of symptoms, now with hematuria and bil flank pain.   Allison Anis, PA-C 04/28/23 2232

## 2023-04-28 NOTE — ED Triage Notes (Signed)
   Arrived with EMS: UTI symptoms for the past 3 days. In the last 24 hours started having hematuria and bilateral flank pain    18g R AC; NS hanging, given fentanyl, and 4mg  zofran   102/98, pulse 72, pulse ox 99%, temp 99.9   Not currently on antibiotics, has been taking AZO

## 2023-04-29 LAB — CBC WITH DIFFERENTIAL/PLATELET
Abs Immature Granulocytes: 0.01 10*3/uL (ref 0.00–0.07)
Basophils Absolute: 0 10*3/uL (ref 0.0–0.1)
Basophils Relative: 0 %
Eosinophils Absolute: 0.1 10*3/uL (ref 0.0–0.5)
Eosinophils Relative: 1 %
HCT: 34.5 % — ABNORMAL LOW (ref 36.0–46.0)
Hemoglobin: 11.3 g/dL — ABNORMAL LOW (ref 12.0–15.0)
Immature Granulocytes: 0 %
Lymphocytes Relative: 40 %
Lymphs Abs: 1.8 10*3/uL (ref 0.7–4.0)
MCH: 27.4 pg (ref 26.0–34.0)
MCHC: 32.8 g/dL (ref 30.0–36.0)
MCV: 83.7 fL (ref 80.0–100.0)
Monocytes Absolute: 0.5 10*3/uL (ref 0.1–1.0)
Monocytes Relative: 11 %
Neutro Abs: 2.1 10*3/uL (ref 1.7–7.7)
Neutrophils Relative %: 48 %
Platelets: 217 10*3/uL (ref 150–400)
RBC: 4.12 MIL/uL (ref 3.87–5.11)
RDW: 13.8 % (ref 11.5–15.5)
WBC: 4.6 10*3/uL (ref 4.0–10.5)
nRBC: 0 % (ref 0.0–0.2)

## 2023-04-29 LAB — URINALYSIS, ROUTINE W REFLEX MICROSCOPIC
Bacteria, UA: NONE SEEN
Bilirubin Urine: NEGATIVE
Glucose, UA: NEGATIVE mg/dL
Ketones, ur: NEGATIVE mg/dL
Leukocytes,Ua: NEGATIVE
Nitrite: POSITIVE — AB
Protein, ur: NEGATIVE mg/dL
RBC / HPF: >50 RBC/hpf (ref 0–5)
Specific Gravity, Urine: 1.008 (ref 1.005–1.030)
pH: 9 — ABNORMAL HIGH (ref 5.0–8.0)

## 2023-04-29 LAB — BASIC METABOLIC PANEL
Anion gap: 9 (ref 5–15)
BUN: 9 mg/dL (ref 6–20)
CO2: 22 mmol/L (ref 22–32)
Calcium: 8.6 mg/dL — ABNORMAL LOW (ref 8.9–10.3)
Chloride: 106 mmol/L (ref 98–111)
Creatinine, Ser: 0.73 mg/dL (ref 0.44–1.00)
GFR, Estimated: >60 mL/min (ref >60)
Glucose, Bld: 100 mg/dL — ABNORMAL HIGH (ref 70–99)
Potassium: 3.3 mmol/L — ABNORMAL LOW (ref 3.5–5.1)
Sodium: 137 mmol/L (ref 135–145)

## 2023-04-29 LAB — PREGNANCY, URINE: Preg Test, Ur: NEGATIVE

## 2023-04-29 MED ORDER — CEPHALEXIN 500 MG PO CAPS
1000.0000 mg | ORAL_CAPSULE | Freq: Two times a day (BID) | ORAL | 0 refills | Status: DC
Start: 2023-04-29 — End: 2023-10-10

## 2023-04-29 MED ORDER — ONDANSETRON HCL 4 MG/2ML IJ SOLN
4.0000 mg | Freq: Once | INTRAMUSCULAR | Status: AC
  Administered 2023-04-29: 4 mg via INTRAVENOUS
  Filled 2023-04-29: qty 2

## 2023-04-29 MED ORDER — SODIUM CHLORIDE 0.9 % IV SOLN
1.0000 g | Freq: Once | INTRAVENOUS | Status: AC
  Administered 2023-04-29: 1 g via INTRAVENOUS
  Filled 2023-04-29: qty 10

## 2023-04-29 MED ORDER — HYDROMORPHONE HCL 1 MG/ML IJ SOLN
0.5000 mg | Freq: Once | INTRAMUSCULAR | Status: AC
  Administered 2023-04-29: 0.5 mg via INTRAVENOUS
  Filled 2023-04-29: qty 1

## 2023-04-29 MED ORDER — OXYCODONE-ACETAMINOPHEN 5-325 MG PO TABS
1.0000 | ORAL_TABLET | Freq: Four times a day (QID) | ORAL | 0 refills | Status: DC | PRN
Start: 2023-04-29 — End: 2023-05-14

## 2023-04-29 NOTE — ED Notes (Signed)
Pt co worsening back pain

## 2023-04-29 NOTE — ED Provider Notes (Signed)
[place] EMERGENCY DEPARTMENT AT [facility] Provider Note   CSN: [id number] Arrival date & time: 04/28/23  2200     History  No chief complaint on file.   Allison Richard is a 38 y.o. female.  Patient to ED with symptoms of flank pain bilaterally and dysuria. She has seen a small amount of blood in her urine. No fever. +Nausea without vomiting. No diarrhea.   The history is provided by the patient. No language interpreter was used.       Home Medications Prior to Admission medications   Medication Sig Start Date End Date Taking? Authorizing Provider  cephALEXin (KEFLEX) 500 MG capsule Take 2 capsules (1,000 mg total) by mouth 2 (two) times daily. 04/29/23  Yes Elpidio Anis, PA-C  oxyCODONE-acetaminophen (PERCOCET/ROXICET) 5-325 MG tablet Take 1 tablet by mouth every 6 (six) hours as needed for severe pain (pain score 7-10). 04/29/23  Yes Babak Lucus, PA-C  acetaminophen (TYLENOL) 500 MG tablet Take 1,000 mg by mouth every 6 (six) hours as needed for moderate pain or headache.     [provider]  albuterol (PROVENTIL HFA;VENTOLIN HFA) 108 (90 Base) MCG/ACT inhaler Inhale 2 puffs into the lungs every 6 (six) hours as needed for wheezing or shortness of breath. 07/26/15   Tonye Pearson, MD  ALPRAZolam Prudy Feeler) 1 MG tablet Take 1-2 tablets (1-2 mg total) by mouth 2 (two) times daily as needed for anxiety. 12/06/17   Valarie Cones, Dema Severin, PA-C  cyclobenzaprine (FLEXERIL) 10 MG tablet Take 1 tablet (10 mg total) by mouth 3 (three) times daily as needed for muscle spasms. 12/06/17   Weber, Dema Severin, PA-C  escitalopram (LEXAPRO) 20 MG tablet Take 1.5 tablets (30 mg total) by mouth daily. Patient taking differently: Take 20 mg by mouth every morning.  12/06/17   Valarie Cones, Dema Severin, PA-C  ketorolac (TORADOL) 10 MG tablet Take 1 tablet (10 mg total) by mouth every 6 (six) hours as needed. Patient taking differently: Take 10 mg by mouth every 6 (six) hours as needed for  moderate pain or severe pain.  01/09/18   Rene Paci, MD  naproxen (NAPROSYN) 500 MG tablet Take 1 tablet (500 mg total) by mouth 2 (two) times daily. Patient taking differently: Take 500 mg by mouth 2 (two) times daily as needed for mild pain or moderate pain.  12/06/17   Maczis, Elmer Sow, PA-C  oxybutynin (DITROPAN) 5 MG tablet Take 1 tablet (5 mg total) by mouth every 8 (eight) hours as needed for bladder spasms. 01/09/18   Rene Paci, MD  QUEtiapine (SEROQUEL) 300 MG tablet Take 1 tablet (300 mg total) by mouth at bedtime. Patient taking differently: Take 300 mg by mouth at bedtime.  12/06/17   Weber, Dema Severin, PA-C      Allergies    Tramadol, Vicodin [hydrocodone-acetaminophen], and Hydrocodone    Review of Systems   Review of Systems  Physical Exam Updated Vital Signs BP 121/80   Pulse 88   Temp 98.5 F (36.9 C)   Resp 16   LMP 04/21/2023   SpO2 98%  Physical Exam Constitutional:      General: She is not in acute distress.    Appearance: She is well-developed. She is not ill-appearing.  Cardiovascular:     Rate and Rhythm: Normal rate.  Pulmonary:     Effort: Pulmonary effort is normal.  Abdominal:     General: There is no distension.  Musculoskeletal:  General: Normal range of motion.     Cervical back: Normal range of motion.  Skin:    General: Skin is warm and dry.  Neurological:     Mental Status: She is alert and oriented to person, place, and time.     ED Results / Procedures / Treatments   Labs (all labs ordered are listed, but only abnormal results are displayed) Labs Reviewed  CBC WITH DIFFERENTIAL/PLATELET - Abnormal; Notable for the following components:      Result Value   Hemoglobin 11.3 (*)    HCT 34.5 (*)    All other components within normal limits  BASIC METABOLIC PANEL - Abnormal; Notable for the following components:   Potassium 3.3 (*)    Glucose, Bld 100 (*)    Calcium 8.6 (*)    All other components  within normal limits  URINALYSIS, ROUTINE W REFLEX MICROSCOPIC - Abnormal; Notable for the following components:   Color, Urine AMBER (*)    pH 9.0 (*)    Hgb urine dipstick LARGE (*)    Nitrite POSITIVE (*)    All other components within normal limits  PREGNANCY, URINE    EKG None  Radiology No results found.  Procedures Procedures    Medications Ordered in ED Medications  cefTRIAXone (ROCEPHIN) 1 g in sodium chloride 0.9 % 100 mL IVPB (has no administration in time range)  ketorolac (TORADOL) 30 MG/ML injection 15 mg (15 mg Intravenous Given 04/28/23 2321)  ondansetron (ZOFRAN) injection 4 mg (4 mg Intravenous Given 04/29/23 0033)  HYDROmorphone (DILAUDID) injection 0.5 mg (0.5 mg Intravenous Given 04/29/23 0034)    ED Course/ Medical Decision Making/ A&P Clinical Course as of 04/29/23 0118  Sun Apr 29, 2023  0018 Patient with bilateral flank pain and dysuria for the past 2 days. No fever. Has been taking AZO without relief. Eating and drinking per her usual.  [SU]  0109 Patient's labs c/w UTI with nitrite positivity. No leukocytosis. K+ 3.3 otherwise normal electrolytes.   Patient has been observed while in waiting triage and has been stable, pain managed. No vomiting. Discussed evaluation findings with the patient. She is comfortable with receiving antibiotics and being discharged home from triage. Discussed return precautions.  [SU]    Clinical Course User Index [SU] Elpidio Anis, PA-C                                 Medical Decision Making Amount and/or Complexity of Data Reviewed Labs: ordered.  Risk Prescription drug management.           Final Clinical Impression(s) / ED Diagnoses Final diagnoses:  Acute cystitis with hematuria    Rx / DC Orders ED Discharge Orders          Ordered    cephALEXin (KEFLEX) 500 MG capsule  2 times daily        04/29/23 0118    oxyCODONE-acetaminophen (PERCOCET/ROXICET) 5-325 MG tablet  Every 6 hours PRN         04/29/23 0118              Elpidio Anis, PA-C 04/29/23 0118    Gilda Crease, MD 04/29/23 [phone]

## 2023-05-13 ENCOUNTER — Emergency Department (HOSPITAL_COMMUNITY)
Admission: EM | Admit: 2023-05-13 | Discharge: 2023-05-14 | Disposition: A | Discharge status: Home / Self Care | Attending: Emergency Medicine | Admitting: Emergency Medicine

## 2023-05-13 ENCOUNTER — Encounter (HOSPITAL_COMMUNITY): Payer: Self-pay | Admitting: Emergency Medicine

## 2023-05-13 ENCOUNTER — Other Ambulatory Visit: Payer: Self-pay

## 2023-05-13 DIAGNOSIS — N946 Dysmenorrhea, unspecified: Secondary | ICD-10-CM | POA: Insufficient documentation

## 2023-05-13 DIAGNOSIS — N939 Abnormal uterine and vaginal bleeding, unspecified: Secondary | ICD-10-CM | POA: Diagnosis present

## 2023-05-13 LAB — CBC
HCT: 38.4 % (ref 36.0–46.0)
Hemoglobin: 12.6 g/dL (ref 12.0–15.0)
MCH: 27.4 pg (ref 26.0–34.0)
MCHC: 32.8 g/dL (ref 30.0–36.0)
MCV: 83.5 fL (ref 80.0–100.0)
Platelets: 288 10*3/uL (ref 150–400)
RBC: 4.6 MIL/uL (ref 3.87–5.11)
RDW: 14 % (ref 11.5–15.5)
WBC: 5.8 10*3/uL (ref 4.0–10.5)
nRBC: 0 % (ref 0.0–0.2)

## 2023-05-13 LAB — BASIC METABOLIC PANEL
Anion gap: 10 (ref 5–15)
BUN: 10 mg/dL (ref 6–20)
CO2: 21 mmol/L — ABNORMAL LOW (ref 22–32)
Calcium: 8.9 mg/dL (ref 8.9–10.3)
Chloride: 105 mmol/L (ref 98–111)
Creatinine, Ser: 0.9 mg/dL (ref 0.44–1.00)
GFR, Estimated: >60 mL/min (ref >60)
Glucose, Bld: 125 mg/dL — ABNORMAL HIGH (ref 70–99)
Potassium: 3.4 mmol/L — ABNORMAL LOW (ref 3.5–5.1)
Sodium: 136 mmol/L (ref 135–145)

## 2023-05-13 LAB — HCG, SERUM, QUALITATIVE: Preg, Serum: NEGATIVE

## 2023-05-13 NOTE — ED Triage Notes (Signed)
Patient BIB EMS c/o vaginal bleeding x 3 days. Patient report she on her period but her bleeding is heavy done normal. Patient c/o dizziness , pelvic and back pain. Patient denies N/V.

## 2023-05-14 LAB — CBC
HCT: 39.3 % (ref 36.0–46.0)
Hemoglobin: 12.7 g/dL (ref 12.0–15.0)
MCH: 27.5 pg (ref 26.0–34.0)
MCHC: 32.3 g/dL (ref 30.0–36.0)
MCV: 85.2 fL (ref 80.0–100.0)
Platelets: 283 10*3/uL (ref 150–400)
RBC: 4.61 MIL/uL (ref 3.87–5.11)
RDW: 14 % (ref 11.5–15.5)
WBC: 7 10*3/uL (ref 4.0–10.5)
nRBC: 0 % (ref 0.0–0.2)

## 2023-05-14 MED ORDER — KETOROLAC TROMETHAMINE 15 MG/ML IJ SOLN
15.0000 mg | Freq: Once | INTRAMUSCULAR | Status: AC
  Administered 2023-05-14: 15 mg via INTRAMUSCULAR
  Filled 2023-05-14: qty 1

## 2023-05-14 MED ORDER — OXYCODONE-ACETAMINOPHEN 5-325 MG PO TABS: 1.0000 | ORAL_TABLET | Freq: Four times a day (QID) | ORAL | 0 refills | Status: DC | PRN

## 2023-05-14 MED ORDER — OXYCODONE-ACETAMINOPHEN 5-325 MG PO TABS
1.0000 | ORAL_TABLET | Freq: Once | ORAL | Status: AC
  Administered 2023-05-14: 1 via ORAL
  Filled 2023-05-14: qty 1
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
## 2023-05-14 NOTE — Discharge Instructions (Addendum)
We recommend Aleve or Midol for management of ongoing pain. You may take Percocet as prescribed for management of severe pain; do not drive or drink alcohol after taking this medication. Follow up with an OBGYN as soon as you are able.  Return to the emergency department for new or concerning symptoms such as lightheadedness, shortness of breath, fever in the setting of ongoing heavy vaginal bleeding.

## 2023-06-21 ENCOUNTER — Encounter (HOSPITAL_COMMUNITY): Payer: Self-pay

## 2023-06-21 ENCOUNTER — Emergency Department (HOSPITAL_COMMUNITY)

## 2023-06-21 ENCOUNTER — Emergency Department (HOSPITAL_COMMUNITY)
Admission: EM | Admit: 2023-06-21 | Discharge: 2023-06-21 | Disposition: A | Discharge status: Home / Self Care | Attending: Emergency Medicine | Admitting: Emergency Medicine

## 2023-06-21 ENCOUNTER — Other Ambulatory Visit: Payer: Self-pay

## 2023-06-21 DIAGNOSIS — Y9269 Other specified industrial and construction area as the place of occurrence of the external cause: Secondary | ICD-10-CM | POA: Insufficient documentation

## 2023-06-21 DIAGNOSIS — W19XXXA Unspecified fall, initial encounter: Secondary | ICD-10-CM

## 2023-06-21 DIAGNOSIS — M545 Low back pain, unspecified: Secondary | ICD-10-CM | POA: Insufficient documentation

## 2023-06-21 DIAGNOSIS — Y990 Civilian activity done for income or pay: Secondary | ICD-10-CM | POA: Diagnosis not present

## 2023-06-21 DIAGNOSIS — W11XXXA Fall on and from ladder, initial encounter: Secondary | ICD-10-CM | POA: Insufficient documentation

## 2023-06-21 LAB — HCG, SERUM, QUALITATIVE: Preg, Serum: NEGATIVE

## 2023-06-21 LAB — CBC WITH DIFFERENTIAL/PLATELET
Abs Immature Granulocytes: 0.01 10*3/uL (ref 0.00–0.07)
Basophils Absolute: 0 10*3/uL (ref 0.0–0.1)
Basophils Relative: 0 %
Eosinophils Absolute: 0 10*3/uL (ref 0.0–0.5)
Eosinophils Relative: 1 %
HCT: 37.3 % (ref 36.0–46.0)
Hemoglobin: 12.3 g/dL (ref 12.0–15.0)
Immature Granulocytes: 0 %
Lymphocytes Relative: 35 %
Lymphs Abs: 1.7 10*3/uL (ref 0.7–4.0)
MCH: 27.5 pg (ref 26.0–34.0)
MCHC: 33 g/dL (ref 30.0–36.0)
MCV: 83.3 fL (ref 80.0–100.0)
Monocytes Absolute: 0.4 10*3/uL (ref 0.1–1.0)
Monocytes Relative: 9 %
Neutro Abs: 2.8 10*3/uL (ref 1.7–7.7)
Neutrophils Relative %: 55 %
Platelets: 239 10*3/uL (ref 150–400)
RBC: 4.48 MIL/uL (ref 3.87–5.11)
RDW: 13.7 % (ref 11.5–15.5)
WBC: 5.1 10*3/uL (ref 4.0–10.5)
nRBC: 0 % (ref 0.0–0.2)

## 2023-06-21 LAB — COMPREHENSIVE METABOLIC PANEL WITH GFR
ALT: 18 U/L (ref 0–44)
AST: 22 U/L (ref 15–41)
Albumin: 4.2 g/dL (ref 3.5–5.0)
Alkaline Phosphatase: 50 U/L (ref 38–126)
Anion gap: 10 (ref 5–15)
BUN: 7 mg/dL (ref 6–20)
CO2: 24 mmol/L (ref 22–32)
Calcium: 9.3 mg/dL (ref 8.9–10.3)
Chloride: 104 mmol/L (ref 98–111)
Creatinine, Ser: 1.02 mg/dL — ABNORMAL HIGH (ref 0.44–1.00)
GFR, Estimated: >60 mL/min (ref >60)
Glucose, Bld: 98 mg/dL (ref 70–99)
Potassium: 3.8 mmol/L (ref 3.5–5.1)
Sodium: 138 mmol/L (ref 135–145)
Total Bilirubin: 0.8 mg/dL (ref 0.0–1.2)
Total Protein: 7 g/dL (ref 6.5–8.1)

## 2023-06-21 MED ORDER — ONDANSETRON HCL 4 MG/2ML IJ SOLN
4.0000 mg | Freq: Once | INTRAMUSCULAR | Status: AC
Start: 2023-06-21 — End: 2023-06-21
  Administered 2023-06-21: 4 mg via INTRAVENOUS
  Filled 2023-06-21: qty 2

## 2023-06-21 MED ORDER — KETOROLAC TROMETHAMINE 15 MG/ML IJ SOLN
15.0000 mg | Freq: Once | INTRAMUSCULAR | Status: AC
  Administered 2023-06-21: 15 mg via INTRAVENOUS
  Filled 2023-06-21: qty 1

## 2023-06-21 MED ORDER — MORPHINE SULFATE (PF) 4 MG/ML IV SOLN: 4.0000 mg | Freq: Once | INTRAVENOUS | Status: DC

## 2023-06-21 MED ORDER — HYDROCODONE-ACETAMINOPHEN 5-325 MG PO TABS: 1.0000 | ORAL_TABLET | Freq: Four times a day (QID) | ORAL | 0 refills | Status: AC | PRN

## 2023-06-21 MED ORDER — FENTANYL CITRATE PF 50 MCG/ML IJ SOSY
100.0000 ug | PREFILLED_SYRINGE | Freq: Once | INTRAMUSCULAR | Status: AC
  Administered 2023-06-21: 100 ug via INTRAVENOUS
  Filled 2023-06-21: qty 2

## 2023-06-21 MED ORDER — PROCHLORPERAZINE EDISYLATE 10 MG/2ML IJ SOLN
5.0000 mg | Freq: Once | INTRAMUSCULAR | Status: AC
  Administered 2023-06-21: 5 mg via INTRAVENOUS
  Filled 2023-06-21: qty 2

## 2023-06-21 MED ORDER — LIDOCAINE 5 % EX PTCH
1.0000 | MEDICATED_PATCH | Freq: Once | CUTANEOUS | Status: DC
  Administered 2023-06-21: 1 via TRANSDERMAL
  Filled 2023-06-21: qty 1

## 2023-06-21 MED ORDER — METHOCARBAMOL 500 MG PO TABS: 500.0000 mg | ORAL_TABLET | Freq: Every evening | ORAL | 0 refills | Status: DC | PRN

## 2023-06-21 NOTE — ED Provider Triage Note (Signed)
 Emergency Medicine Provider Triage Evaluation Note  Allison Richard , a 38 y.o. female  was evaluated in triage.  Pt complains of fall from ladder.  She fell approximately 4 feet onto her lower back area and then rolled.  She hit her head on a bar.  She has had some tingling in her legs.  No abdominal or chest pain.  No shortness of breath.  She was placed in a cervical collar prior to arrival.  She has had some vomiting.  Review of Systems  Positive: Lower back pain, vomiting Negative:   Physical Exam  BP 127/83 (BP Location: Left Arm)   Pulse 84   Temp 97.8 F (36.6 C) (Oral)   Resp 17   Ht 5\' 4"  (1.626 m)   Wt 84.8 kg   LMP 06/14/2023   SpO2 100%   BMI 32.10 kg/m  Gen:   Awake, no distress   Resp:  Normal effort  MSK:   Moves extremities without difficulty  Other:  Tender mid L-spine midline, no abdominal tenderness  Medical Decision Making  Medically screening exam initiated at 2:54 PM.  Appropriate orders placed.  Allison Richard was informed that the remainder of the evaluation will be completed by another provider, this initial triage assessment does not replace that evaluation, and the importance of remaining in the ED until their evaluation is complete.     Renne Crigler, PA-C 06/21/23 1456

## 2023-06-21 NOTE — ED Triage Notes (Signed)
 Pt BIB EMS for a fall off a 65ft ladder. Pt fell on back. 1 episode of vomiting. C/O dizziness and neck/back pain. Denies LOC and no blood thinners. C/O bilateral leg tingling in back of legs. Axox4. Pt arrives w c-collar.

## 2023-06-21 NOTE — ED Provider Notes (Signed)
  EMERGENCY DEPARTMENT AT [facility] Provider Note   CSN: [id number] Arrival date & time: 06/21/23  1430     History  Chief Complaint  Patient presents with   Marletta Lor    Allison Richard is a 38 y.o. female with a PMHx of bipolar 1 disorder, polysubstance use, anxiety who presents to the ED after falling off a ladder.  She was at work at area when she missed the bottom 2 steps of the ladder and fell backwards, hitting her lower back on the ground.  She also endorses hitting her head.  No LOC.  3 episodes of emesis since the event, and patient reports continued nausea.   Fall     Home Medications Prior to Admission medications   Medication Sig Start Date End Date Taking? Authorizing Provider  HYDROcodone-acetaminophen (NORCO/VICODIN) 5-325 MG tablet Take 1 tablet by mouth every 6 (six) hours as needed for up to 2 days. 06/21/23 06/23/23 Yes Renella Cunas, MD  methocarbamol (ROBAXIN) 500 MG tablet Take 1 tablet (500 mg total) by mouth at bedtime as needed for muscle spasms. 06/21/23  Yes Renella Cunas, MD  acetaminophen (TYLENOL) 500 MG tablet Take 1,000 mg by mouth every 6 (six) hours as needed for moderate pain or headache.     [provider]  albuterol (PROVENTIL HFA;VENTOLIN HFA) 108 (90 Base) MCG/ACT inhaler Inhale 2 puffs into the lungs every 6 (six) hours as needed for wheezing or shortness of breath. 07/26/15   Tonye Pearson, MD  ALPRAZolam Prudy Feeler) 1 MG tablet Take 1-2 tablets (1-2 mg total) by mouth 2 (two) times daily as needed for anxiety. 12/06/17   Weber, Dema Severin, PA-C  cephALEXin (KEFLEX) 500 MG capsule Take 2 capsules (1,000 mg total) by mouth 2 (two) times daily. 04/29/23   Elpidio Anis, PA-C  cyclobenzaprine (FLEXERIL) 10 MG tablet Take 1 tablet (10 mg total) by mouth 3 (three) times daily as needed for muscle spasms. 12/06/17   Weber, Dema Severin, PA-C  escitalopram (LEXAPRO) 20 MG tablet Take 1.5 tablets (30 mg total) by mouth daily. Patient  taking differently: Take 20 mg by mouth every morning.  12/06/17   Valarie Cones, Dema Severin, PA-C  ketorolac (TORADOL) 10 MG tablet Take 1 tablet (10 mg total) by mouth every 6 (six) hours as needed. Patient taking differently: Take 10 mg by mouth every 6 (six) hours as needed for moderate pain or severe pain.  01/09/18   Rene Paci, MD  naproxen (NAPROSYN) 500 MG tablet Take 1 tablet (500 mg total) by mouth 2 (two) times daily. Patient taking differently: Take 500 mg by mouth 2 (two) times daily as needed for mild pain or moderate pain.  12/06/17   Maczis, Elmer Sow, PA-C  oxybutynin (DITROPAN) 5 MG tablet Take 1 tablet (5 mg total) by mouth every 8 (eight) hours as needed for bladder spasms. 01/09/18   Rene Paci, MD  oxyCODONE-acetaminophen (PERCOCET/ROXICET) 5-325 MG tablet Take 1 tablet by mouth every 6 (six) hours as needed for severe pain (pain score 7-10). 05/14/23   Antony Madura, PA-C  QUEtiapine (SEROQUEL) 300 MG tablet Take 1 tablet (300 mg total) by mouth at bedtime. Patient taking differently: Take 300 mg by mouth at bedtime.  12/06/17   Valarie Cones, Dema Severin, PA-C      Allergies    Tramadol, Vicodin [hydrocodone-acetaminophen], and Hydrocodone    Review of Systems   Review of Systems  Physical Exam Updated Vital Signs BP 122/78   Pulse  60   Temp 97.8 F (36.6 C) (Oral)   Resp 18   Ht 5\' 4"  (1.626 m)   Wt 84.8 kg   LMP 06/14/2023   SpO2 100%   BMI 32.10 kg/m  Physical Exam Vitals and nursing note reviewed.  Constitutional:      General: She is not in acute distress.    Appearance: Normal appearance. She is not ill-appearing.  HENT:     Head: Normocephalic and atraumatic.     Mouth/Throat:     Lips: Pink. No lesions.     Mouth: Mucous membranes are moist.     Pharynx: Oropharynx is clear.  Eyes:     Extraocular Movements: Extraocular movements intact.     Conjunctiva/sclera: Conjunctivae normal.     Pupils: Pupils are equal, round, and reactive to  light.  Neck:     Comments: C-collar in place Cardiovascular:     Rate and Rhythm: Normal rate and regular rhythm.     Heart sounds: Normal heart sounds.  Pulmonary:     Effort: Pulmonary effort is normal.     Breath sounds: Normal breath sounds. No decreased air movement. No decreased breath sounds.  Abdominal:     Palpations: Abdomen is soft.     Tenderness: There is no abdominal tenderness. There is no right CVA tenderness or left CVA tenderness.  Musculoskeletal:     Right lower leg: No edema.     Left lower leg: No edema.     Comments: No noted deformities.  No abrasions or lacerations.  Midline spinal tenderness to lower thoracic, lumbar, sacral palpation.   Skin:    General: Skin is warm and dry.     Capillary Refill: Capillary refill takes less than 2 seconds.  Neurological:     Mental Status: She is alert and oriented to person, place, and time.     Cranial Nerves: Cranial nerves 2-12 are intact. No facial asymmetry.     Comments: Sensory deficit reported in bilateral lower extremities.  5 out of 5 strength of all extremities.  Coordination intact.  Gait deferred given patient in c-collar and spine has not been cleared.  Psychiatric:        Behavior: Behavior is cooperative.     ED Results / Procedures / Treatments   Labs (all labs ordered are listed, but only abnormal results are displayed) Labs Reviewed  COMPREHENSIVE METABOLIC PANEL WITH GFR - Abnormal; Notable for the following components:      Result Value   Creatinine, Ser 1.02 (*)    All other components within normal limits  CBC WITH DIFFERENTIAL/PLATELET  HCG, SERUM, QUALITATIVE    EKG None  Radiology CT Lumbar Spine Wo Contrast Result Date: 06/21/2023 CLINICAL DATA:  Back trauma, no prior imaging (Age >= 16y); Neck trauma, dangerous injury mechanism (Age 38-64y). Fall EXAM: CT CERVICAL, THORACIC, AND LUMBAR SPINE WITHOUT CONTRAST TECHNIQUE: Multidetector CT imaging of the cervical, thoracic and lumbar  spine was performed without intravenous contrast. Multiplanar CT image reconstructions were also generated. RADIATION DOSE REDUCTION: This exam was performed according to the departmental dose-optimization program which includes automated exposure control, adjustment of the mA and/or kV according to patient size and/or use of iterative reconstruction technique. COMPARISON:  CT lumbar spine 12/29/2022, CT C-spine 11/02/2011, CT chest 11/02/2011 FINDINGS: CT CERVICAL SPINE FINDINGS Alignment: Normal. Skull base and vertebrae: No acute fracture. No aggressive appearing focal osseous lesion or focal pathologic process. Soft tissues and spinal canal: No prevertebral fluid or swelling. No visible  canal hematoma. Upper chest: Unremarkable. Other: None. CT THORACIC SPINE FINDINGS Alignment: Normal. Vertebrae: No acute fracture or focal pathologic process. Paraspinal and other soft tissues: Negative. Disc levels: Maintained. CT LUMBAR SPINE FINDINGS Segmentation: 5 lumbar type vertebrae. Alignment: Normal. Vertebrae: No acute fracture or focal pathologic process. Paraspinal and other soft tissues: Negative. Disc levels: Maintained. Other: None. IMPRESSION: No acute displaced fracture or traumatic listhesis of the cervical thoracic, lumbar spine. Electronically Signed   By: Tish Frederickson M.D.   On: 06/21/2023 17:13   CT Cervical Spine Wo Contrast Result Date: 06/21/2023 CLINICAL DATA:  Back trauma, no prior imaging (Age >= 16y); Neck trauma, dangerous injury mechanism (Age 45-64y). Fall EXAM: CT CERVICAL, THORACIC, AND LUMBAR SPINE WITHOUT CONTRAST TECHNIQUE: Multidetector CT imaging of the cervical, thoracic and lumbar spine was performed without intravenous contrast. Multiplanar CT image reconstructions were also generated. RADIATION DOSE REDUCTION: This exam was performed according to the departmental dose-optimization program which includes automated exposure control, adjustment of the mA and/or kV according to  patient size and/or use of iterative reconstruction technique. COMPARISON:  CT lumbar spine 12/29/2022, CT C-spine 11/02/2011, CT chest 11/02/2011 FINDINGS: CT CERVICAL SPINE FINDINGS Alignment: Normal. Skull base and vertebrae: No acute fracture. No aggressive appearing focal osseous lesion or focal pathologic process. Soft tissues and spinal canal: No prevertebral fluid or swelling. No visible canal hematoma. Upper chest: Unremarkable. Other: None. CT THORACIC SPINE FINDINGS Alignment: Normal. Vertebrae: No acute fracture or focal pathologic process. Paraspinal and other soft tissues: Negative. Disc levels: Maintained. CT LUMBAR SPINE FINDINGS Segmentation: 5 lumbar type vertebrae. Alignment: Normal. Vertebrae: No acute fracture or focal pathologic process. Paraspinal and other soft tissues: Negative. Disc levels: Maintained. Other: None. IMPRESSION: No acute displaced fracture or traumatic listhesis of the cervical thoracic, lumbar spine. Electronically Signed   By: Tish Frederickson M.D.   On: 06/21/2023 17:13   CT Thoracic Spine Wo Contrast Result Date: 06/21/2023 CLINICAL DATA:  Back trauma, no prior imaging (Age >= 16y); Neck trauma, dangerous injury mechanism (Age 51-64y). Fall EXAM: CT CERVICAL, THORACIC, AND LUMBAR SPINE WITHOUT CONTRAST TECHNIQUE: Multidetector CT imaging of the cervical, thoracic and lumbar spine was performed without intravenous contrast. Multiplanar CT image reconstructions were also generated. RADIATION DOSE REDUCTION: This exam was performed according to the departmental dose-optimization program which includes automated exposure control, adjustment of the mA and/or kV according to patient size and/or use of iterative reconstruction technique. COMPARISON:  CT lumbar spine 12/29/2022, CT C-spine 11/02/2011, CT chest 11/02/2011 FINDINGS: CT CERVICAL SPINE FINDINGS Alignment: Normal. Skull base and vertebrae: No acute fracture. No aggressive appearing focal osseous lesion or focal  pathologic process. Soft tissues and spinal canal: No prevertebral fluid or swelling. No visible canal hematoma. Upper chest: Unremarkable. Other: None. CT THORACIC SPINE FINDINGS Alignment: Normal. Vertebrae: No acute fracture or focal pathologic process. Paraspinal and other soft tissues: Negative. Disc levels: Maintained. CT LUMBAR SPINE FINDINGS Segmentation: 5 lumbar type vertebrae. Alignment: Normal. Vertebrae: No acute fracture or focal pathologic process. Paraspinal and other soft tissues: Negative. Disc levels: Maintained. Other: None. IMPRESSION: No acute displaced fracture or traumatic listhesis of the cervical thoracic, lumbar spine. Electronically Signed   By: Tish Frederickson M.D.   On: 06/21/2023 17:13   CT Head Wo Contrast Result Date: 06/21/2023 CLINICAL DATA:  Head trauma, moderate-severe EXAM: CT HEAD WITHOUT CONTRAST TECHNIQUE: Contiguous axial images were obtained from the base of the skull through the vertex without intravenous contrast. RADIATION DOSE REDUCTION: This exam was performed according to the  departmental dose-optimization program which includes automated exposure control, adjustment of the mA and/or kV according to patient size and/or use of iterative reconstruction technique. COMPARISON:  CT head 12/17/2013 FINDINGS: Brain: No evidence of large-territorial acute infarction. No parenchymal hemorrhage. No mass lesion. No extra-axial collection. No mass effect or midline shift. No hydrocephalus. Basilar cisterns are patent. Vascular: No hyperdense vessel. Skull: No acute fracture or focal lesion. Sinuses/Orbits: Left ethmoid sinus osteoma. Paranasal sinuses and mastoid air cells are clear. The orbits are unremarkable. Other: None. IMPRESSION: No acute intracranial abnormality. Electronically Signed   By: Tish Frederickson M.D.   On: 06/21/2023 17:03    Procedures Procedures   Medications Ordered in ED Medications  lidocaine (LIDODERM) 5 % 1 patch (1 patch Transdermal Patch  Applied 06/21/23 1745)  ondansetron (ZOFRAN) injection 4 mg (4 mg Intravenous Given 06/21/23 1524)  fentaNYL (SUBLIMAZE) injection 100 mcg (100 mcg Intravenous Given 06/21/23 1530)  prochlorperazine (COMPAZINE) injection 5 mg (5 mg Intravenous Given 06/21/23 1746)  ketorolac (TORADOL) 15 MG/ML injection 15 mg (15 mg Intravenous Given 06/21/23 1745)    ED Course/ Medical Decision Making/ A&P                                 Medical Decision Making Amount and/or Complexity of Data Reviewed Radiology: ordered.  Risk Prescription drug management.   38 y.o. female with a PMHx of bipolar 1 disorder, polysubstance use, anxiety who presents to the ED after falling off a ladder.  Differential diagnosis includes extremity fracture dislocation, spinal fracture or malalignment, intracranial hemorrhage, skull fracture, concussion.  Physical exam notable for tenderness to lower thoracic and lumbar/sacral midline spinal palpation.  Patient also endorsing tingling of bilateral lower extremities.  Will order CT head and cervical spine given head trauma with continued emesis and nausea.  Will order CT lumbar and thoracic spine given midline tenderness to palpation and patient reported tingling of bilateral lower extremities.  Patient complaining of spinal pain as well as nausea.  Ordered 100 mcg of fentanyl as well as 4 mg of IV Zofran.  Labs are unremarkable.  Creatinine of 1.02 from baseline of 0.9.  No leukocytosis, stable hemoglobin, hCG negative.  CT head notable for no intracranial abnormality, including no acute hemorrhage or large territory infarct.  There is no evidence of skull fracture.  CT cervical/thoracic/lumbar spine notable for no acute fracture or malalignment.  Patient received Toradol and lidocaine patch as well as Compazine for her nausea and was able to ambulate independently to and from the restroom.  Patient is felt to be hemodynamically stable for discharge.  She will be discharged with a  2-day supply of opioid as well as a muscle relaxer for bedtime.  Patient was hemodynamically stable at the time of discharge.  Final Clinical Impression(s) / ED Diagnoses Final diagnoses:  Fall, initial encounter  Fall from ladder, initial encounter    Rx / DC Orders ED Discharge Orders          Ordered    methocarbamol (ROBAXIN) 500 MG tablet  At bedtime PRN        06/21/23 1826    HYDROcodone-acetaminophen (NORCO/VICODIN) 5-325 MG tablet  Every 6 hours PRN        06/21/23 1826           Renella Cunas, PGY-2 Emergency Medicine   Renella Cunas, MD 06/21/23 Marianna Payment, MD 06/28/23 1540

## 2023-06-21 NOTE — ED Notes (Signed)
 Pt ambulated without assistance to bathroom. Pt tolerated well.

## 2023-06-21 NOTE — Discharge Instructions (Addendum)
 For pain: Ibuprofen 800mg  every 6-8 hours For severe pain, you can take Norco   At night, you may take baclofen 500 mg.  After fall, you may be sore for some time.  Take things easier, however do not stop moving or the soreness will get worse.  You may take warm baths or warm compresses for increased pain control.

## 2023-08-11 ENCOUNTER — Emergency Department (HOSPITAL_COMMUNITY)

## 2023-08-11 ENCOUNTER — Emergency Department (HOSPITAL_COMMUNITY)
Admission: EM | Admit: 2023-08-11 | Discharge: 2023-08-11 | Disposition: A | Discharge status: Home / Self Care | Attending: Emergency Medicine | Admitting: Emergency Medicine

## 2023-08-11 ENCOUNTER — Encounter (HOSPITAL_COMMUNITY): Payer: Self-pay

## 2023-08-11 ENCOUNTER — Other Ambulatory Visit: Payer: Self-pay

## 2023-08-11 DIAGNOSIS — K529 Noninfective gastroenteritis and colitis, unspecified: Secondary | ICD-10-CM

## 2023-08-11 DIAGNOSIS — R1032 Left lower quadrant pain: Secondary | ICD-10-CM | POA: Diagnosis present

## 2023-08-11 LAB — CBC
HCT: 37 % (ref 36.0–46.0)
Hemoglobin: 11.9 g/dL — ABNORMAL LOW (ref 12.0–15.0)
MCH: 26.9 pg (ref 26.0–34.0)
MCHC: 32.2 g/dL (ref 30.0–36.0)
MCV: 83.7 fL (ref 80.0–100.0)
Platelets: 238 10*3/uL (ref 150–400)
RBC: 4.42 MIL/uL (ref 3.87–5.11)
RDW: 13.5 % (ref 11.5–15.5)
WBC: 6 10*3/uL (ref 4.0–10.5)
nRBC: 0 % (ref 0.0–0.2)

## 2023-08-11 LAB — LIPASE, BLOOD: Lipase: 25 U/L (ref 11–51)

## 2023-08-11 LAB — URINALYSIS, ROUTINE W REFLEX MICROSCOPIC
Bilirubin Urine: NEGATIVE
Glucose, UA: NEGATIVE mg/dL
Ketones, ur: NEGATIVE mg/dL
Leukocytes,Ua: NEGATIVE
Nitrite: NEGATIVE
Protein, ur: 100 mg/dL — AB
RBC / HPF: >50 RBC/hpf (ref 0–5)
Specific Gravity, Urine: 1.023 (ref 1.005–1.030)
pH: 9 — ABNORMAL HIGH (ref 5.0–8.0)

## 2023-08-11 LAB — COMPREHENSIVE METABOLIC PANEL WITH GFR
ALT: 21 U/L (ref 0–44)
AST: 22 U/L (ref 15–41)
Albumin: 4.3 g/dL (ref 3.5–5.0)
Alkaline Phosphatase: 60 U/L (ref 38–126)
Anion gap: 10 (ref 5–15)
BUN: 8 mg/dL (ref 6–20)
CO2: 23 mmol/L (ref 22–32)
Calcium: 9.3 mg/dL (ref 8.9–10.3)
Chloride: 103 mmol/L (ref 98–111)
Creatinine, Ser: 0.89 mg/dL (ref 0.44–1.00)
GFR, Estimated: >60 mL/min (ref >60)
Glucose, Bld: 101 mg/dL — ABNORMAL HIGH (ref 70–99)
Potassium: 3.6 mmol/L (ref 3.5–5.1)
Sodium: 136 mmol/L (ref 135–145)
Total Bilirubin: 1.2 mg/dL (ref 0.0–1.2)
Total Protein: 7.5 g/dL (ref 6.5–8.1)

## 2023-08-11 LAB — HCG, SERUM, QUALITATIVE: Preg, Serum: NEGATIVE

## 2023-08-11 MED ORDER — HYDROMORPHONE HCL 1 MG/ML IJ SOLN
0.5000 mg | Freq: Once | INTRAMUSCULAR | Status: AC
  Administered 2023-08-11: 0.5 mg via INTRAVENOUS
  Filled 2023-08-11: qty 1

## 2023-08-11 MED ORDER — MORPHINE SULFATE (PF) 4 MG/ML IV SOLN
4.0000 mg | Freq: Once | INTRAVENOUS | Status: AC
  Administered 2023-08-11: 4 mg via INTRAVENOUS
  Filled 2023-08-11: qty 1

## 2023-08-11 MED ORDER — METOCLOPRAMIDE HCL 5 MG/ML IJ SOLN
10.0000 mg | Freq: Once | INTRAMUSCULAR | Status: AC
  Administered 2023-08-11: 10 mg via INTRAVENOUS
  Filled 2023-08-11: qty 2

## 2023-08-11 MED ORDER — SODIUM CHLORIDE 0.9 % IV BOLUS
1000.0000 mL | Freq: Once | INTRAVENOUS | Status: AC
  Administered 2023-08-11: 1000 mL via INTRAVENOUS

## 2023-08-11 MED ORDER — ONDANSETRON HCL 4 MG/2ML IJ SOLN
4.0000 mg | Freq: Once | INTRAMUSCULAR | Status: AC
  Administered 2023-08-11: 4 mg via INTRAVENOUS
  Filled 2023-08-11: qty 2

## 2023-08-11 MED ORDER — KETOROLAC TROMETHAMINE 15 MG/ML IJ SOLN
15.0000 mg | Freq: Once | INTRAMUSCULAR | Status: AC
  Administered 2023-08-11: 15 mg via INTRAVENOUS
  Filled 2023-08-11: qty 1

## 2023-08-11 MED ORDER — IOHEXOL 300 MG/ML  SOLN
100.0000 mL | Freq: Once | INTRAMUSCULAR | Status: AC | PRN
  Administered 2023-08-11: 100 mL via INTRAVENOUS

## 2023-08-11 MED ORDER — ONDANSETRON 4 MG PO TBDP: 4.0000 mg | ORAL_TABLET | Freq: Three times a day (TID) | ORAL | 0 refills | Status: DC | PRN

## 2023-08-11 MED ORDER — OXYCODONE HCL 5 MG PO TABS: 5.0000 mg | ORAL_TABLET | ORAL | 0 refills | Status: DC | PRN

## 2023-08-11 NOTE — Discharge Instructions (Addendum)
 Your CT scan shows that you have a colon infection/inflammation called colitis.  This should be self-limiting and you will need to follow-up with your primary care physician.  We are giving you narcotic pain medicine, oxycodone , to help with severe pain.  Do not drive or operate heavy machinery while taking this medicine and do not combine with alcohol or other medicines.  If you develop new or worsening abdominal pain, bloody diarrhea, fever, or any other new/concerning symptoms then return to the ER.

## 2023-08-11 NOTE — ED Triage Notes (Signed)
 Pt ambulatory to triage with c/o blood in urine and pain in groin that radiates to abdomen and back. Hx of endometriosis and cysts. Pt reports this pain is different from her normal pain. Pt endorses nausea, vomiting, and diarrhea. Pt reports symptoms have been going on for 2 days. Pt c/o SOB secondary to pain.

## 2023-08-11 NOTE — ED Provider Notes (Signed)
 [place] EMERGENCY DEPARTMENT AT [facility] Provider Note   CSN: [id number] Arrival date & time: 08/11/23  1904     History  Chief Complaint  Patient presents with   Abdominal Pain   Back Pain   Groin Pain    Allison Richard is a 38 y.o. female.  HPI 38 year old female presents with abdominal pain, vomiting, diarrhea and hematuria.  Overall her symptoms have been for a couple days.  She has been having vomiting and diarrhea without blood.  Has been having abdominal pain and some back pain, primarily left-sided.  This feels different than the typical issues she has been dealing with with ovarian cysts.  She started having some hematuria without dysuria today.  No vaginal symptoms such as discharge or bleeding.  She denies any concern for STI.  The pain is severe.  Home Medications Prior to Admission medications   Medication Sig Start Date End Date Taking? Authorizing Provider  ondansetron  (ZOFRAN -ODT) 4 MG disintegrating tablet Take 1 tablet (4 mg total) by mouth every 8 (eight) hours as needed for nausea or vomiting. 08/11/23  Yes Jerilynn Montenegro, MD  oxyCODONE  (ROXICODONE ) 5 MG immediate release tablet Take 1 tablet (5 mg total) by mouth every 4 (four) hours as needed for severe pain (pain score 7-10). 08/11/23  Yes Jerilynn Montenegro, MD  acetaminophen  (TYLENOL ) 500 MG tablet Take 1,000 mg by mouth every 6 (six) hours as needed for moderate pain or headache.     [provider]  albuterol  (PROVENTIL  HFA;VENTOLIN  HFA) 108 (90 Base) MCG/ACT inhaler Inhale 2 puffs into the lungs every 6 (six) hours as needed for wheezing or shortness of breath. 07/26/15   Johnnie Nan, MD  ALPRAZolam  (XANAX ) 1 MG tablet Take 1-2 tablets (1-2 mg total) by mouth 2 (two) times daily as needed for anxiety. 12/06/17   Weber, Clance Crigler, PA-C  cephALEXin  (KEFLEX ) 500 MG capsule Take 2 capsules (1,000 mg total) by mouth 2 (two) times daily. 04/29/23   Mandy Second, PA-C  cyclobenzaprine   (FLEXERIL ) 10 MG tablet Take 1 tablet (10 mg total) by mouth 3 (three) times daily as needed for muscle spasms. 12/06/17   Weber, Clance Crigler, PA-C  escitalopram  (LEXAPRO ) 20 MG tablet Take 1.5 tablets (30 mg total) by mouth daily. Patient taking differently: Take 20 mg by mouth every morning.  12/06/17   Weber, Clance Crigler, PA-C  ketorolac  (TORADOL ) 10 MG tablet Take 1 tablet (10 mg total) by mouth every 6 (six) hours as needed. Patient taking differently: Take 10 mg by mouth every 6 (six) hours as needed for moderate pain or severe pain.  01/09/18   Adelbert Homans, MD  methocarbamol  (ROBAXIN ) 500 MG tablet Take 1 tablet (500 mg total) by mouth at bedtime as needed for muscle spasms. 06/21/23   Louvella Royalty, MD  naproxen  (NAPROSYN ) 500 MG tablet Take 1 tablet (500 mg total) by mouth 2 (two) times daily. Patient taking differently: Take 500 mg by mouth 2 (two) times daily as needed for mild pain or moderate pain.  12/06/17   Maczis, Michael M, PA-C  oxybutynin  (DITROPAN ) 5 MG tablet Take 1 tablet (5 mg total) by mouth every 8 (eight) hours as needed for bladder spasms. 01/09/18   Adelbert Homans, MD  QUEtiapine  (SEROQUEL ) 300 MG tablet Take 1 tablet (300 mg total) by mouth at bedtime. Patient taking differently: Take 300 mg by mouth at bedtime.  12/06/17   Glen Land, PA-C  Allergies    Tramadol , Vicodin [hydrocodone -acetaminophen ], and Hydrocodone     Review of Systems   Review of Systems  Constitutional:  Negative for fever.  Gastrointestinal:  Positive for abdominal pain, diarrhea, nausea and vomiting. Negative for blood in stool.  Genitourinary:  Positive for hematuria. Negative for dysuria, vaginal discharge and vaginal pain.  Musculoskeletal:  Positive for back pain.    Physical Exam Updated Vital Signs BP 135/62 (BP Location: Left Arm)   Pulse 70   Temp 98.2 F (36.8 C) (Oral)   Resp 16   Ht 5\' 4"  (1.626 m)   Wt 81.6 kg   LMP 08/07/2023 (Exact Date)   SpO2 100%    BMI 30.90 kg/m  Physical Exam Vitals and nursing note reviewed.  Constitutional:      General: She is not in acute distress.    Appearance: She is well-developed. She is not ill-appearing or diaphoretic.  HENT:     Head: Normocephalic and atraumatic.  Cardiovascular:     Rate and Rhythm: Normal rate and regular rhythm.     Heart sounds: Normal heart sounds.  Pulmonary:     Effort: Pulmonary effort is normal.     Breath sounds: Normal breath sounds.  Abdominal:     Palpations: Abdomen is soft.     Tenderness: There is abdominal tenderness in the left lower quadrant.  Skin:    General: Skin is warm and dry.  Neurological:     Mental Status: She is alert.     ED Results / Procedures / Treatments   Labs (all labs ordered are listed, but only abnormal results are displayed) Labs Reviewed  COMPREHENSIVE METABOLIC PANEL WITH GFR - Abnormal; Notable for the following components:      Result Value   Glucose, Bld 101 (*)    All other components within normal limits  CBC - Abnormal; Notable for the following components:   Hemoglobin 11.9 (*)    All other components within normal limits  URINALYSIS, ROUTINE W REFLEX MICROSCOPIC - Abnormal; Notable for the following components:   APPearance HAZY (*)    pH 9.0 (*)    Hgb urine dipstick MODERATE (*)    Protein, ur 100 (*)    Bacteria, UA RARE (*)    All other components within normal limits  LIPASE, BLOOD  HCG, SERUM, QUALITATIVE    EKG None  Radiology CT ABDOMEN PELVIS W CONTRAST Result Date: 08/11/2023 CLINICAL DATA:  Left lower quadrant pain EXAM: CT ABDOMEN AND PELVIS WITH CONTRAST TECHNIQUE: Multidetector CT imaging of the abdomen and pelvis was performed using the standard protocol following bolus administration of intravenous contrast. RADIATION DOSE REDUCTION: This exam was performed according to the departmental dose-optimization program which includes automated exposure control, adjustment of the mA and/or kV  according to patient size and/or use of iterative reconstruction technique. CONTRAST:  OMNIPAQUE  IOHEXOL  300 MG/ML  SOLN COMPARISON:  Ultrasound from earlier in the same day. FINDINGS: Lower chest: No acute abnormality. Hepatobiliary: No focal liver abnormality is seen. No gallstones, gallbladder wall thickening, or biliary dilatation. Pancreas: Unremarkable. No pancreatic ductal dilatation or surrounding inflammatory changes. Spleen: Normal in size without focal abnormality. Adrenals/Urinary Tract: Adrenal glands are within normal limits. Kidneys demonstrate a normal enhancement pattern bilaterally. No renal calculi or obstructive changes are seen. The bladder is partially distended. Stomach/Bowel: No obstructive changes in the colon are seen. In the junction of the descending and sigmoid colons mild inflammatory changes are noted consistent with focal colitis possibly related to  diverticulitis. No abscess or perforation is noted. The appendix is within normal limits. Small bowel and stomach are unremarkable. Vascular/Lymphatic: No significant vascular findings are present. No enlarged abdominal or pelvic lymph nodes. Reproductive: Uterus and bilateral adnexa are unremarkable. Other: No abdominal wall hernia or abnormality. No abdominopelvic ascites. Musculoskeletal: No acute or significant osseous findings. IMPRESSION: Mild changes of colitis at the junction of the descending and sigmoid colons. No other focal abnormality is noted. Electronically Signed   By: Violeta Grey M.D.   On: 08/11/2023 22:12   US  Pelvis Complete Result Date: 08/11/2023 CLINICAL DATA:  Left lower quadrant pain EXAM: TRANSABDOMINAL AND TRANSVAGINAL ULTRASOUND OF PELVIS DOPPLER ULTRASOUND OF OVARIES TECHNIQUE: Both transabdominal and transvaginal ultrasound examinations of the pelvis were performed. Transabdominal technique was performed for global imaging of the pelvis including uterus, ovaries, adnexal regions, and pelvic  cul-de-sac. It was necessary to proceed with endovaginal exam following the transabdominal exam to visualize the uterus, endometrium, ovaries and adnexa. Color and duplex Doppler ultrasound was utilized to evaluate blood flow to the ovaries. COMPARISON:  12/29/2022 FINDINGS: Uterus Measurements: 9.9 x 4.7 x 6.9 cm = volume: 168 mL. No fibroids or other mass visualized. Endometrium Thickness: 14 mm in thickness.  No focal abnormality visualized. Right ovary Measurements: 3.2 x 1.9 x 1.8 cm = volume: 5 mL. Normal appearance/no adnexal mass. Left ovary Measurements: 2 x 1.3 x 1.9 cm = volume: 4 mL. Normal appearance/no adnexal mass. Pulsed Doppler evaluation of both ovaries demonstrates normal low-resistance arterial and venous waveforms. Other findings No abnormal free fluid. IMPRESSION: No acute findings.  No adnexal mass or evidence of torsion. Electronically Signed   By: Janeece Mechanic M.D.   On: 08/11/2023 20:30   US  Transvaginal Non-OB Result Date: 08/11/2023 CLINICAL DATA:  Left lower quadrant pain EXAM: TRANSABDOMINAL AND TRANSVAGINAL ULTRASOUND OF PELVIS DOPPLER ULTRASOUND OF OVARIES TECHNIQUE: Both transabdominal and transvaginal ultrasound examinations of the pelvis were performed. Transabdominal technique was performed for global imaging of the pelvis including uterus, ovaries, adnexal regions, and pelvic cul-de-sac. It was necessary to proceed with endovaginal exam following the transabdominal exam to visualize the uterus, endometrium, ovaries and adnexa. Color and duplex Doppler ultrasound was utilized to evaluate blood flow to the ovaries. COMPARISON:  12/29/2022 FINDINGS: Uterus Measurements: 9.9 x 4.7 x 6.9 cm = volume: 168 mL. No fibroids or other mass visualized. Endometrium Thickness: 14 mm in thickness.  No focal abnormality visualized. Right ovary Measurements: 3.2 x 1.9 x 1.8 cm = volume: 5 mL. Normal appearance/no adnexal mass. Left ovary Measurements: 2 x 1.3 x 1.9 cm = volume: 4 mL. Normal  appearance/no adnexal mass. Pulsed Doppler evaluation of both ovaries demonstrates normal low-resistance arterial and venous waveforms. Other findings No abnormal free fluid. IMPRESSION: No acute findings.  No adnexal mass or evidence of torsion. Electronically Signed   By: Janeece Mechanic M.D.   On: 08/11/2023 20:30   US  Art/Ven Flow Abd Pelv Doppler Result Date: 08/11/2023 CLINICAL DATA:  Left lower quadrant pain EXAM: TRANSABDOMINAL AND TRANSVAGINAL ULTRASOUND OF PELVIS DOPPLER ULTRASOUND OF OVARIES TECHNIQUE: Both transabdominal and transvaginal ultrasound examinations of the pelvis were performed. Transabdominal technique was performed for global imaging of the pelvis including uterus, ovaries, adnexal regions, and pelvic cul-de-sac. It was necessary to proceed with endovaginal exam following the transabdominal exam to visualize the uterus, endometrium, ovaries and adnexa. Color and duplex Doppler ultrasound was utilized to evaluate blood flow to the ovaries. COMPARISON:  12/29/2022 FINDINGS: Uterus Measurements: 9.9 x 4.7 x  6.9 cm = volume: 168 mL. No fibroids or other mass visualized. Endometrium Thickness: 14 mm in thickness.  No focal abnormality visualized. Right ovary Measurements: 3.2 x 1.9 x 1.8 cm = volume: 5 mL. Normal appearance/no adnexal mass. Left ovary Measurements: 2 x 1.3 x 1.9 cm = volume: 4 mL. Normal appearance/no adnexal mass. Pulsed Doppler evaluation of both ovaries demonstrates normal low-resistance arterial and venous waveforms. Other findings No abnormal free fluid. IMPRESSION: No acute findings.  No adnexal mass or evidence of torsion. Electronically Signed   By: Janeece Mechanic M.D.   On: 08/11/2023 20:30    Procedures Procedures    Medications Ordered in ED Medications  sodium chloride  0.9 % bolus 1,000 mL (1,000 mLs Intravenous New Bag/Given 08/11/23 2002)  ondansetron  (ZOFRAN ) injection 4 mg (4 mg Intravenous Given 08/11/23 2001)  morphine  (PF) 4 MG/ML injection 4 mg (4 mg  Intravenous Given 08/11/23 1955)  ketorolac  (TORADOL ) 15 MG/ML injection 15 mg (15 mg Intravenous Given 08/11/23 2057)  HYDROmorphone  (DILAUDID ) injection 0.5 mg (0.5 mg Intravenous Given 08/11/23 2145)  metoCLOPramide  (REGLAN ) injection 10 mg (10 mg Intravenous Given 08/11/23 2142)  iohexol  (OMNIPAQUE ) 300 MG/ML solution 100 mL (100 mLs Intravenous Contrast Given 08/11/23 2150)    ED Course/ Medical Decision Making/ A&P                                 Medical Decision Making Amount and/or Complexity of Data Reviewed External Data Reviewed: radiology and notes. Labs: ordered.    Details: Normal WBC.  Not pregnant. Radiology: ordered and independent interpretation performed.    Details: Mild colitis  Risk Prescription drug management.   Patient presents with left-sided abdominal pain, vomiting, diarrhea.  She states this feels worse and different than her typical pain she has been getting with what seems like endometriosis.  She has had previous gynecologic surgery as well.  Started out with an ultrasound which does not show torsion.  However her pain is still poorly controlled and she is worried something else is going on.  She has received multiple CTs which have not shown any significant pathology for the most part on chart review.  However she would like to have repeat imaging as she is her pain is out of control.  Eventually pain was better controlled and the CT shows colitis which goes along with her vomiting and diarrhea.  She has not had any recent travel, antibiotic use, camping, or bloody stools.  No fevers.  Discussed options with patient, she is okay holding off on antibiotics as this is likely a self-limiting process.  Will treat with pain control, she has tolerated oxycodone  in the past.  Will also give Zofran  as needed for nausea/vomiting.  Appears stable for discharge home with return precautions.   Final Clinical Impression(s) / ED Diagnoses Final diagnoses:  Colitis    Rx  / DC Orders ED Discharge Orders          Ordered    oxyCODONE  (ROXICODONE ) 5 MG immediate release tablet  Every 4 hours PRN        08/11/23 2230    ondansetron  (ZOFRAN -ODT) 4 MG disintegrating tablet  Every 8 hours PRN        08/11/23 2230              Jerilynn Montenegro, MD 08/11/23 2244

## 2023-09-17 ENCOUNTER — Emergency Department (HOSPITAL_COMMUNITY)

## 2023-09-17 ENCOUNTER — Emergency Department (HOSPITAL_COMMUNITY)
Admission: EM | Admit: 2023-09-17 | Discharge: 2023-09-18 | Disposition: A | Discharge status: Home / Self Care | Attending: Emergency Medicine | Admitting: Emergency Medicine

## 2023-09-17 ENCOUNTER — Other Ambulatory Visit: Payer: Self-pay

## 2023-09-17 ENCOUNTER — Encounter (HOSPITAL_COMMUNITY): Payer: Self-pay | Admitting: Emergency Medicine

## 2023-09-17 DIAGNOSIS — N939 Abnormal uterine and vaginal bleeding, unspecified: Secondary | ICD-10-CM | POA: Insufficient documentation

## 2023-09-17 DIAGNOSIS — E876 Hypokalemia: Secondary | ICD-10-CM | POA: Insufficient documentation

## 2023-09-17 DIAGNOSIS — R8289 Other abnormal findings on cytological and histological examination of urine: Secondary | ICD-10-CM | POA: Insufficient documentation

## 2023-09-17 DIAGNOSIS — R1030 Lower abdominal pain, unspecified: Secondary | ICD-10-CM

## 2023-09-17 DIAGNOSIS — R1032 Left lower quadrant pain: Secondary | ICD-10-CM | POA: Insufficient documentation

## 2023-09-17 LAB — COMPREHENSIVE METABOLIC PANEL WITH GFR
ALT: 22 U/L (ref 0–44)
AST: 24 U/L (ref 15–41)
Albumin: 4.5 g/dL (ref 3.5–5.0)
Alkaline Phosphatase: 49 U/L (ref 38–126)
Anion gap: 8 (ref 5–15)
BUN: 10 mg/dL (ref 6–20)
CO2: 23 mmol/L (ref 22–32)
Calcium: 9.1 mg/dL (ref 8.9–10.3)
Chloride: 105 mmol/L (ref 98–111)
Creatinine, Ser: 0.83 mg/dL (ref 0.44–1.00)
GFR, Estimated: >60 mL/min (ref >60)
Glucose, Bld: 93 mg/dL (ref 70–99)
Potassium: 3.4 mmol/L — ABNORMAL LOW (ref 3.5–5.1)
Sodium: 136 mmol/L (ref 135–145)
Total Bilirubin: 1.1 mg/dL (ref 0.0–1.2)
Total Protein: 7.7 g/dL (ref 6.5–8.1)

## 2023-09-17 LAB — CBC
HCT: 38.7 % (ref 36.0–46.0)
Hemoglobin: 12.6 g/dL (ref 12.0–15.0)
MCH: 27.8 pg (ref 26.0–34.0)
MCHC: 32.6 g/dL (ref 30.0–36.0)
MCV: 85.2 fL (ref 80.0–100.0)
Platelets: 189 10*3/uL (ref 150–400)
RBC: 4.54 MIL/uL (ref 3.87–5.11)
RDW: 14.1 % (ref 11.5–15.5)
WBC: 4.5 10*3/uL (ref 4.0–10.5)
nRBC: 0 % (ref 0.0–0.2)

## 2023-09-17 LAB — URINALYSIS, ROUTINE W REFLEX MICROSCOPIC
Bilirubin Urine: NEGATIVE
Glucose, UA: NEGATIVE mg/dL
Hgb urine dipstick: NEGATIVE
Ketones, ur: 5 mg/dL — AB
Nitrite: NEGATIVE
Protein, ur: NEGATIVE mg/dL
Specific Gravity, Urine: 1.026 (ref 1.005–1.030)
pH: 5 (ref 5.0–8.0)

## 2023-09-17 LAB — HCG, SERUM, QUALITATIVE: Preg, Serum: NEGATIVE

## 2023-09-17 MED ORDER — IOHEXOL 300 MG/ML  SOLN
100.0000 mL | Freq: Once | INTRAMUSCULAR | Status: AC | PRN
  Administered 2023-09-17: 100 mL via INTRAVENOUS

## 2023-09-17 MED ORDER — POTASSIUM CHLORIDE CRYS ER 20 MEQ PO TBCR
40.0000 meq | EXTENDED_RELEASE_TABLET | Freq: Once | ORAL | Status: AC
  Administered 2023-09-17: 40 meq via ORAL
  Filled 2023-09-17: qty 2

## 2023-09-17 MED ORDER — FENTANYL CITRATE PF 50 MCG/ML IJ SOSY
50.0000 ug | PREFILLED_SYRINGE | Freq: Once | INTRAMUSCULAR | Status: AC
  Administered 2023-09-17: 50 ug via INTRAVENOUS
  Filled 2023-09-17: qty 1

## 2023-09-17 MED ORDER — SODIUM CHLORIDE 0.9 % IV BOLUS
1000.0000 mL | Freq: Once | INTRAVENOUS | Status: AC
  Administered 2023-09-17: 1000 mL via INTRAVENOUS

## 2023-09-17 MED ORDER — ONDANSETRON HCL 4 MG/2ML IJ SOLN
4.0000 mg | Freq: Once | INTRAMUSCULAR | Status: AC
  Administered 2023-09-17: 4 mg via INTRAVENOUS
  Filled 2023-09-17: qty 2

## 2023-09-17 NOTE — ED Provider Notes (Incomplete)
 [place] EMERGENCY DEPARTMENT AT [facility] Provider Note   CSN: [id number] Arrival date & time: 09/17/23  2130     Patient presents with: Abdominal Pain   Allison Richard is a 38 y.o. female with medical history to include heroin use, chest pain, depression, seizures, alcohol abuse, bipolar 1 disorder, herpes, hepatitis C.  Patient presents to ED for evaluation of left lower abdominal pain.  Reports that she has had left lower abdominal pain associated with vaginal bleeding for the last 2 days.  She states her last menstrual cycle ended on June 9.  She states that she has gone through 4 pads in the last 2 days.  She endorses nausea without vomiting.  Endorses diarrhea.  Denies fevers at home, dysuria.  Denies any excessive vaginal discharge beyond her baseline.  Denies concern for STI.  Reports history of endometriosis.  States that she was set to have hysterectomy however had to move due to familial issues and has not been able to establish with OB/GYN here.  States that she was set to have hysterectomy because her periods are miserable.  Reports that her typical endometrial pain will be to her entire lower abdomen and states that this is more focal to the left lower quadrant.  She reports that she was diagnosed with colitis on 08/11/2023 and completed antibiotics.  Abdominal Pain      Prior to Admission medications   Medication Sig Start Date End Date Taking? Authorizing Provider  acetaminophen  (TYLENOL ) 500 MG tablet Take 1,000 mg by mouth every 6 (six) hours as needed for moderate pain or headache.     [provider]  albuterol  (PROVENTIL  HFA;VENTOLIN  HFA) 108 (90 Base) MCG/ACT inhaler Inhale 2 puffs into the lungs every 6 (six) hours as needed for wheezing or shortness of breath. 07/26/15   Joylene Lamar SQUIBB, MD  ALPRAZolam  (XANAX ) 1 MG tablet Take 1-2 tablets (1-2 mg total) by mouth 2 (two) times daily as needed for anxiety. 12/06/17   Weber, Lauraine LITTIE, PA-C   cephALEXin  (KEFLEX ) 500 MG capsule Take 2 capsules (1,000 mg total) by mouth 2 (two) times daily. 04/29/23   Odell Balls, PA-C  cyclobenzaprine  (FLEXERIL ) 10 MG tablet Take 1 tablet (10 mg total) by mouth 3 (three) times daily as needed for muscle spasms. 12/06/17   Weber, Lauraine LITTIE, PA-C  escitalopram  (LEXAPRO ) 20 MG tablet Take 1.5 tablets (30 mg total) by mouth daily. Patient taking differently: Take 20 mg by mouth every morning.  12/06/17   Weber, Lauraine LITTIE, PA-C  ketorolac  (TORADOL ) 10 MG tablet Take 1 tablet (10 mg total) by mouth every 6 (six) hours as needed. Patient taking differently: Take 10 mg by mouth every 6 (six) hours as needed for moderate pain or severe pain.  01/09/18   Devere Lonni Righter, MD  methocarbamol  (ROBAXIN ) 500 MG tablet Take 1 tablet (500 mg total) by mouth at bedtime as needed for muscle spasms. 06/21/23   Renella Fees, MD  naproxen  (NAPROSYN ) 500 MG tablet Take 1 tablet (500 mg total) by mouth 2 (two) times daily. Patient taking differently: Take 500 mg by mouth 2 (two) times daily as needed for mild pain or moderate pain.  12/06/17   Maczis, Michael M, PA-C  ondansetron  (ZOFRAN -ODT) 4 MG disintegrating tablet Take 1 tablet (4 mg total) by mouth every 8 (eight) hours as needed for nausea or vomiting. 08/11/23   Freddi Hamilton, MD  oxybutynin  (DITROPAN ) 5 MG tablet Take 1 tablet (5 mg total) by  mouth every 8 (eight) hours as needed for bladder spasms. 01/09/18   Devere Lonni Righter, MD  oxyCODONE  (ROXICODONE ) 5 MG immediate release tablet Take 1 tablet (5 mg total) by mouth every 4 (four) hours as needed for severe pain (pain score 7-10). 08/11/23   Freddi Hamilton, MD  QUEtiapine  (SEROQUEL ) 300 MG tablet Take 1 tablet (300 mg total) by mouth at bedtime. Patient taking differently: Take 300 mg by mouth at bedtime.  12/06/17   Allen, Lauraine CROME, PA-C    Allergies: Tramadol , Vicodin [hydrocodone -acetaminophen ], and Hydrocodone     Review of Systems  Gastrointestinal:   Positive for abdominal pain.  All other systems reviewed and are negative.   Updated Vital Signs BP (!) 125/100 (BP Location: Right Arm)   Pulse (!) 106   Temp 98.3 F (36.8 C) (Oral)   Resp 20   Ht 5' 3 (1.6 m)   Wt 81.6 kg   LMP 09/04/2023   SpO2 100%   BMI 31.89 kg/m   Physical Exam  (all labs ordered are listed, but only abnormal results are displayed) Labs Reviewed  COMPREHENSIVE METABOLIC PANEL WITH GFR - Abnormal; Notable for the following components:      Result Value   Potassium 3.4 (*)    All other components within normal limits  URINALYSIS, ROUTINE W REFLEX MICROSCOPIC - Abnormal; Notable for the following components:   APPearance HAZY (*)    Ketones, ur 5 (*)    Leukocytes,Ua MODERATE (*)    Bacteria, UA RARE (*)    All other components within normal limits  CBC  HCG, SERUM, QUALITATIVE    EKG: None  Radiology: No results found.   Procedures   Medications Ordered in the ED - No data to display    Medical Decision Making Amount and/or Complexity of Data Reviewed Labs: ordered.   ***    Final diagnoses:  None    ED Discharge Orders     None

## 2023-09-17 NOTE — ED Triage Notes (Signed)
 Patient c/o abdominal x 1 day. Patient report worsening pain on LLQ region. Patient report vaginal bleeding even its not time for her period yet.  Patient report nausea, denies vomiting. Patient denies fever. Hx endometriosis.

## 2023-09-17 NOTE — ED Provider Notes (Signed)
 [facility] EMERGENCY DEPARTMENT AT [facility] Provider Note   CSN: [id number] Arrival date & time: 09/17/23  2130     Patient presents with: Abdominal Pain   Allison Richard is a 38 y.o. female with medical history to include heroin use, chest pain, depression, seizures, alcohol abuse, bipolar 1 disorder, herpes, hepatitis C.  Patient presents to ED for evaluation of left lower abdominal pain.  Reports that she has had left lower abdominal pain associated with vaginal bleeding for the last 2 days.  She states her last menstrual cycle ended on June 9.  She states that she has gone through 4 pads in the last 2 days.  She endorses nausea without vomiting.  Endorses diarrhea.  Denies fevers at home, dysuria.  Denies any excessive vaginal discharge beyond her baseline.  Denies concern for STI.  Reports history of endometriosis.  States that she was set to have hysterectomy however had to move due to familial issues and has not been able to establish with OB/GYN here.  States that she was set to have hysterectomy because her periods are miserable.  Reports that her typical endometrial pain will be to her entire lower abdomen and states that this is more focal to the left lower quadrant.  She reports that she was diagnosed with colitis on 08/11/2023 and completed antibiotics.  Abdominal Pain      Prior to Admission medications   Medication Sig Start Date End Date Taking? Authorizing Provider  ondansetron  (ZOFRAN ) 4 MG tablet Take 1 tablet (4 mg total) by mouth every 6 (six) hours as needed for nausea or vomiting. 09/18/23  Yes Ruthell Lonni FALCON, PA-C  acetaminophen  (TYLENOL ) 500 MG tablet Take 1,000 mg by mouth every 6 (six) hours as needed for moderate pain or headache.     [provider]  albuterol  (PROVENTIL  HFA;VENTOLIN  HFA) 108 (90 Base) MCG/ACT inhaler Inhale 2 puffs into the lungs every 6 (six) hours as needed for wheezing or shortness of breath. 07/26/15   Joylene Lamar SQUIBB, MD  ALPRAZolam  (XANAX ) 1 MG tablet Take 1-2 tablets (1-2 mg total) by mouth 2 (two) times daily as needed for anxiety. 12/06/17   Weber, Lauraine LITTIE, PA-C  cephALEXin  (KEFLEX ) 500 MG capsule Take 2 capsules (1,000 mg total) by mouth 2 (two) times daily. 04/29/23   Odell Balls, PA-C  cyclobenzaprine  (FLEXERIL ) 10 MG tablet Take 1 tablet (10 mg total) by mouth 3 (three) times daily as needed for muscle spasms. 12/06/17   Weber, Lauraine LITTIE, PA-C  escitalopram  (LEXAPRO ) 20 MG tablet Take 1.5 tablets (30 mg total) by mouth daily. Patient taking differently: Take 20 mg by mouth every morning.  12/06/17   Weber, Lauraine LITTIE, PA-C  ketorolac  (TORADOL ) 10 MG tablet Take 1 tablet (10 mg total) by mouth every 6 (six) hours as needed. Patient taking differently: Take 10 mg by mouth every 6 (six) hours as needed for moderate pain or severe pain.  01/09/18   Devere Lonni Righter, MD  methocarbamol  (ROBAXIN ) 500 MG tablet Take 1 tablet (500 mg total) by mouth at bedtime as needed for muscle spasms. 06/21/23   Renella Fees, MD  naproxen  (NAPROSYN ) 500 MG tablet Take 1 tablet (500 mg total) by mouth 2 (two) times daily. Patient taking differently: Take 500 mg by mouth 2 (two) times daily as needed for mild pain or moderate pain.  12/06/17   Maczis, Michael M, PA-C  ondansetron  (ZOFRAN -ODT) 4 MG disintegrating tablet Take 1 tablet (4 mg total)  by mouth every 8 (eight) hours as needed for nausea or vomiting. 08/11/23   Freddi Hamilton, MD  oxybutynin  (DITROPAN ) 5 MG tablet Take 1 tablet (5 mg total) by mouth every 8 (eight) hours as needed for bladder spasms. 01/09/18   Devere Lonni Righter, MD  oxyCODONE  (ROXICODONE ) 5 MG immediate release tablet Take 1 tablet (5 mg total) by mouth every 4 (four) hours as needed for severe pain (pain score 7-10). 08/11/23   Freddi Hamilton, MD  QUEtiapine  (SEROQUEL ) 300 MG tablet Take 1 tablet (300 mg total) by mouth at bedtime. Patient taking differently: Take 300 mg by mouth at  bedtime.  12/06/17   Allen, Lauraine CROME, PA-C    Allergies: Tramadol , Vicodin [hydrocodone -acetaminophen ], and Hydrocodone     Review of Systems  Gastrointestinal:  Positive for abdominal pain.  All other systems reviewed and are negative.   Updated Vital Signs BP (!) 125/100 (BP Location: Right Arm)   Pulse (!) 106   Temp 98.3 F (36.8 C) (Oral)   Resp 20   Ht 5' 3 (1.6 m)   Wt 81.6 kg   LMP 09/04/2023 Comment: negative HCG 09/17/23  SpO2 100%   BMI 31.89 kg/m   Physical Exam Vitals and nursing note reviewed.  Constitutional:      General: She is not in acute distress.    Appearance: She is well-developed.  HENT:     Head: Normocephalic and atraumatic.   Eyes:     Conjunctiva/sclera: Conjunctivae normal.    Cardiovascular:     Rate and Rhythm: Normal rate and regular rhythm.     Heart sounds: No murmur heard. Pulmonary:     Effort: Pulmonary effort is normal. No respiratory distress.     Breath sounds: Normal breath sounds.  Abdominal:     Palpations: Abdomen is soft.     Tenderness: There is no abdominal tenderness.     Comments: Abdomen soft, compressible.  Subjective TTP of her LLQ.  No overlying skin change.  No CVA tenderness bilaterally.   Musculoskeletal:        General: No swelling.     Cervical back: Neck supple.   Skin:    General: Skin is warm and dry.     Capillary Refill: Capillary refill takes less than 2 seconds.   Neurological:     Mental Status: She is alert.   Psychiatric:        Mood and Affect: Mood normal.     (all labs ordered are listed, but only abnormal results are displayed) Labs Reviewed  COMPREHENSIVE METABOLIC PANEL WITH GFR - Abnormal; Notable for the following components:      Result Value   Potassium 3.4 (*)    All other components within normal limits  URINALYSIS, ROUTINE W REFLEX MICROSCOPIC - Abnormal; Notable for the following components:   APPearance HAZY (*)    Ketones, ur 5 (*)    Leukocytes,Ua MODERATE (*)     Bacteria, UA RARE (*)    All other components within normal limits  CBC  HCG, SERUM, QUALITATIVE    EKG: None  Radiology: US  Pelvis Complete Result Date: 09/18/2023 CLINICAL DATA:  Left pelvic pain, concern for for torsion. EXAM: TRANSABDOMINAL AND TRANSVAGINAL ULTRASOUND OF PELVIS DOPPLER ULTRASOUND OF OVARIES TECHNIQUE: Both transabdominal and transvaginal ultrasound examinations of the pelvis were performed. Transabdominal technique was performed for global imaging of the pelvis including uterus, ovaries, adnexal regions, and pelvic cul-de-sac. It was necessary to proceed with endovaginal exam following the transabdominal exam to  visualize the ovaries and endometrium better. Color and duplex Doppler ultrasound was utilized to evaluate blood flow to the ovaries. COMPARISON:  CT abdomen and pelvis with IV contrast yesterday. FINDINGS: Uterus Measurements: Anteverted and slightly retroflexed measuring 9.1 x 4.8 x 6.2 cm = volume: 141.9 mL. No fibroids or other mass visualized. Endometrium Thickness: 14.9 mm.  No focal abnormality visualized. Right ovary Measurements: 3.5 x 1.9 x 2.9 cm = volume: 10.1 mL. Normal appearance/no adnexal mass. Left ovary Measurements: 2.6 x 1.2 x 2.1 cm = volume: 3.4 mL. Normal appearance/no adnexal mass. Pulsed Doppler evaluation of both ovaries demonstrates normal low-resistance arterial and venous waveforms. Other findings There is minimal anechoic free fluid, typically physiologic at this age. No adnexal mass is evident. IMPRESSION: 1. No evidence of ovarian torsion or other acute abnormality. 2. Minimal anechoic free fluid, typically physiologic at this age. Electronically Signed   By: Francis Quam M.D.   On: 09/18/2023 01:55   US  Transvaginal Non-OB Result Date: 09/18/2023 CLINICAL DATA:  Left pelvic pain, concern for for torsion. EXAM: TRANSABDOMINAL AND TRANSVAGINAL ULTRASOUND OF PELVIS DOPPLER ULTRASOUND OF OVARIES TECHNIQUE: Both transabdominal and transvaginal  ultrasound examinations of the pelvis were performed. Transabdominal technique was performed for global imaging of the pelvis including uterus, ovaries, adnexal regions, and pelvic cul-de-sac. It was necessary to proceed with endovaginal exam following the transabdominal exam to visualize the ovaries and endometrium better. Color and duplex Doppler ultrasound was utilized to evaluate blood flow to the ovaries. COMPARISON:  CT abdomen and pelvis with IV contrast yesterday. FINDINGS: Uterus Measurements: Anteverted and slightly retroflexed measuring 9.1 x 4.8 x 6.2 cm = volume: 141.9 mL. No fibroids or other mass visualized. Endometrium Thickness: 14.9 mm.  No focal abnormality visualized. Right ovary Measurements: 3.5 x 1.9 x 2.9 cm = volume: 10.1 mL. Normal appearance/no adnexal mass. Left ovary Measurements: 2.6 x 1.2 x 2.1 cm = volume: 3.4 mL. Normal appearance/no adnexal mass. Pulsed Doppler evaluation of both ovaries demonstrates normal low-resistance arterial and venous waveforms. Other findings There is minimal anechoic free fluid, typically physiologic at this age. No adnexal mass is evident. IMPRESSION: 1. No evidence of ovarian torsion or other acute abnormality. 2. Minimal anechoic free fluid, typically physiologic at this age. Electronically Signed   By: Francis Quam M.D.   On: 09/18/2023 01:55   US  Art/Ven Flow Abd Pelv Doppler Result Date: 09/18/2023 CLINICAL DATA:  Left pelvic pain, concern for for torsion. EXAM: TRANSABDOMINAL AND TRANSVAGINAL ULTRASOUND OF PELVIS DOPPLER ULTRASOUND OF OVARIES TECHNIQUE: Both transabdominal and transvaginal ultrasound examinations of the pelvis were performed. Transabdominal technique was performed for global imaging of the pelvis including uterus, ovaries, adnexal regions, and pelvic cul-de-sac. It was necessary to proceed with endovaginal exam following the transabdominal exam to visualize the ovaries and endometrium better. Color and duplex Doppler ultrasound  was utilized to evaluate blood flow to the ovaries. COMPARISON:  CT abdomen and pelvis with IV contrast yesterday. FINDINGS: Uterus Measurements: Anteverted and slightly retroflexed measuring 9.1 x 4.8 x 6.2 cm = volume: 141.9 mL. No fibroids or other mass visualized. Endometrium Thickness: 14.9 mm.  No focal abnormality visualized. Right ovary Measurements: 3.5 x 1.9 x 2.9 cm = volume: 10.1 mL. Normal appearance/no adnexal mass. Left ovary Measurements: 2.6 x 1.2 x 2.1 cm = volume: 3.4 mL. Normal appearance/no adnexal mass. Pulsed Doppler evaluation of both ovaries demonstrates normal low-resistance arterial and venous waveforms. Other findings There is minimal anechoic free fluid, typically physiologic at this age. No adnexal  mass is evident. IMPRESSION: 1. No evidence of ovarian torsion or other acute abnormality. 2. Minimal anechoic free fluid, typically physiologic at this age. Electronically Signed   By: Francis Quam M.D.   On: 09/18/2023 01:55   CT ABDOMEN PELVIS W CONTRAST Result Date: 09/17/2023 CLINICAL DATA:  Left lower quadrant pain EXAM: CT ABDOMEN AND PELVIS WITH CONTRAST TECHNIQUE: Multidetector CT imaging of the abdomen and pelvis was performed using the standard protocol following bolus administration of intravenous contrast. RADIATION DOSE REDUCTION: This exam was performed according to the departmental dose-optimization program which includes automated exposure control, adjustment of the mA and/or kV according to patient size and/or use of iterative reconstruction technique. CONTRAST:  OMNIPAQUE  IOHEXOL  300 MG/ML  SOLN COMPARISON:  08/11/2023 FINDINGS: Lower chest: No acute abnormality Hepatobiliary: No focal hepatic abnormality. Gallbladder unremarkable. Pancreas: No focal abnormality or ductal dilatation. Spleen: No focal abnormality.  Normal size. Adrenals/Urinary Tract: No adrenal abnormality. No focal renal abnormality. No stones or hydronephrosis. Urinary bladder is unremarkable.  Stomach/Bowel: Normal appendix. Stomach, large and small bowel grossly unremarkable. Vascular/Lymphatic: No evidence of aneurysm or adenopathy. Reproductive: Uterus and adnexa unremarkable.  No mass. Other: No free fluid or free air. Musculoskeletal: No acute bony abnormality. IMPRESSION: No acute findings in the abdomen or pelvis. Electronically Signed   By: Franky Crease M.D.   On: 09/17/2023 23:58     Procedures   Medications Ordered in the ED  ondansetron  (ZOFRAN ) injection 4 mg (4 mg Intravenous Given 09/17/23 2336)  sodium chloride  0.9 % bolus 1,000 mL (0 mLs Intravenous Stopped 09/18/23 0027)  fentaNYL  (SUBLIMAZE ) injection 50 mcg (50 mcg Intravenous Given 09/17/23 2336)  potassium chloride  SA (KLOR-CON  M) CR tablet 40 mEq (40 mEq Oral Given 09/17/23 2348)  iohexol  (OMNIPAQUE ) 300 MG/ML solution 100 mL (100 mLs Intravenous Contrast Given 09/17/23 2346)  fentaNYL  (SUBLIMAZE ) injection 25 mcg (25 mcg Intravenous Given 09/18/23 0112)      Medical Decision Making Amount and/or Complexity of Data Reviewed Labs: ordered.   38 year old female presents for evaluation.  Please see HPI for further details.  On examination the patient is afebrile and initially tachycardic however on recheck after 1 L of fluid pulse rate normalized.  Her lung sounds are clear bilaterally, she is not hypoxic.  Her abdomen is soft and compressible with tenderness on the left lower quadrant.  No overlying skin change.  No CVA tenderness bilaterally.  Neurological examination at baseline.  Overall nontoxic in appearance.  Will collect CBC, CMP, urinalysis, hCG serum qualitative.  Will assess initially with CT abdomen pelvis.  Patient given 50 mcg fentanyl , 1 L of fluid for tachycardia, 4 of Zofran  for nausea.  Update: Patient CBC without leukocytosis or anemia.  Metabolic panel with potassium 3.4 repleted with 40 mEq oral potassium.  No other electrolyte derangement.  No elevated LFTs.  Anion gap 8.  Serum qualitative hCG  negative.  Urinalysis shows leukocytes, ketones and rare bacteria with 6-10 squamous epithelial cells.  She denies any dysuria.  Low concern for UTI at this time.  Update: Nursing staff has notified me patient requesting additional pain medication.  Patient provided with 25 mcg fentanyl .  CT scan of patient abdomen unremarkable.  Will proceed to ultrasound imaging to rule out torsion.  Patient ultrasound imaging unremarkable.  No concerning findings.  No acute findings.  No indications for further workup at this time.  Will refer patient to OB/GYN service for further management and care.  Have advised her to take ibuprofen  and  Tylenol  for pain.  She is stable to discharge home.    Final diagnoses:  Lower abdominal pain    ED Discharge Orders          Ordered    ondansetron  (ZOFRAN ) 4 MG tablet  Every 6 hours PRN        09/18/23 0207               Ruthell Lonni FALCON, PA-C 09/18/23 [id number]    Trine Raynell Moder, MD 09/18/23 [id number]

## 2023-09-18 ENCOUNTER — Emergency Department (HOSPITAL_COMMUNITY)

## 2023-09-18 MED ORDER — FENTANYL CITRATE PF 50 MCG/ML IJ SOSY
25.0000 ug | PREFILLED_SYRINGE | Freq: Once | INTRAMUSCULAR | Status: AC
  Administered 2023-09-18: 25 ug via INTRAVENOUS
  Filled 2023-09-18: qty 1

## 2023-09-18 MED ORDER — ONDANSETRON HCL 4 MG PO TABS
4.0000 mg | ORAL_TABLET | Freq: Four times a day (QID) | ORAL | 0 refills | Status: DC | PRN
Start: 2023-09-18 — End: 2023-10-10

## 2023-09-18 NOTE — Discharge Instructions (Signed)
 It was a pleasure taking part in your care.  As discussed, your work was reassuring.  Please follow-up with OB/GYN service.  I referred you.  Please call and make an appointment in the morning.  You may take ibuprofen  or Tylenol  every 6 hours as needed for pain.  Return to the ED with any new or worsening symptoms.  I have sent Zofran  to your pharmacy, please take this every 6 hours as needed for nausea and vomiting.

## 2023-10-10 ENCOUNTER — Other Ambulatory Visit: Payer: Self-pay

## 2023-10-10 ENCOUNTER — Emergency Department (HOSPITAL_COMMUNITY)
Admission: EM | Admit: 2023-10-10 | Discharge: 2023-10-11 | Disposition: A | Discharge status: Home / Self Care | Attending: Emergency Medicine | Admitting: Emergency Medicine

## 2023-10-10 ENCOUNTER — Emergency Department (HOSPITAL_COMMUNITY)

## 2023-10-10 ENCOUNTER — Encounter (HOSPITAL_COMMUNITY): Payer: Self-pay

## 2023-10-10 DIAGNOSIS — R1084 Generalized abdominal pain: Secondary | ICD-10-CM | POA: Insufficient documentation

## 2023-10-10 DIAGNOSIS — R1032 Left lower quadrant pain: Secondary | ICD-10-CM | POA: Diagnosis present

## 2023-10-10 DIAGNOSIS — M545 Low back pain, unspecified: Secondary | ICD-10-CM | POA: Diagnosis not present

## 2023-10-10 DIAGNOSIS — R112 Nausea with vomiting, unspecified: Secondary | ICD-10-CM | POA: Diagnosis not present

## 2023-10-10 DIAGNOSIS — R197 Diarrhea, unspecified: Secondary | ICD-10-CM | POA: Insufficient documentation

## 2023-10-10 DIAGNOSIS — R1030 Lower abdominal pain, unspecified: Secondary | ICD-10-CM

## 2023-10-10 LAB — CBC WITH DIFFERENTIAL/PLATELET
Abs Immature Granulocytes: 0.01 K/uL (ref 0.00–0.07)
Basophils Absolute: 0 K/uL (ref 0.0–0.1)
Basophils Relative: 0 %
Eosinophils Absolute: 0.1 K/uL (ref 0.0–0.5)
Eosinophils Relative: 1 %
HCT: 39.5 % (ref 36.0–46.0)
Hemoglobin: 12.9 g/dL (ref 12.0–15.0)
Immature Granulocytes: 0 %
Lymphocytes Relative: 34 %
Lymphs Abs: 1.7 K/uL (ref 0.7–4.0)
MCH: 27.6 pg (ref 26.0–34.0)
MCHC: 32.7 g/dL (ref 30.0–36.0)
MCV: 84.4 fL (ref 80.0–100.0)
Monocytes Absolute: 0.5 K/uL (ref 0.1–1.0)
Monocytes Relative: 10 %
Neutro Abs: 2.8 K/uL (ref 1.7–7.7)
Neutrophils Relative %: 55 %
Platelets: 224 K/uL (ref 150–400)
RBC: 4.68 MIL/uL (ref 3.87–5.11)
RDW: 14 % (ref 11.5–15.5)
WBC: 5.1 K/uL (ref 4.0–10.5)
nRBC: 0 % (ref 0.0–0.2)

## 2023-10-10 LAB — URINALYSIS, W/ REFLEX TO CULTURE (INFECTION SUSPECTED)
Bilirubin Urine: NEGATIVE
Glucose, UA: NEGATIVE mg/dL
Hgb urine dipstick: NEGATIVE
Ketones, ur: NEGATIVE mg/dL
Nitrite: NEGATIVE
Protein, ur: NEGATIVE mg/dL
Specific Gravity, Urine: 1.029 (ref 1.005–1.030)
pH: 5 (ref 5.0–8.0)

## 2023-10-10 LAB — COMPREHENSIVE METABOLIC PANEL WITH GFR
ALT: 24 U/L (ref 0–44)
AST: 23 U/L (ref 15–41)
Albumin: 4.6 g/dL (ref 3.5–5.0)
Alkaline Phosphatase: 60 U/L (ref 38–126)
Anion gap: 9 (ref 5–15)
BUN: 16 mg/dL (ref 6–20)
CO2: 22 mmol/L (ref 22–32)
Calcium: 9.4 mg/dL (ref 8.9–10.3)
Chloride: 105 mmol/L (ref 98–111)
Creatinine, Ser: 0.88 mg/dL (ref 0.44–1.00)
GFR, Estimated: >60 mL/min (ref >60)
Glucose, Bld: 99 mg/dL (ref 70–99)
Potassium: 3.7 mmol/L (ref 3.5–5.1)
Sodium: 136 mmol/L (ref 135–145)
Total Bilirubin: 1 mg/dL (ref 0.0–1.2)
Total Protein: 8.1 g/dL (ref 6.5–8.1)

## 2023-10-10 LAB — LIPASE, BLOOD: Lipase: 30 U/L (ref 11–51)

## 2023-10-10 LAB — PREGNANCY, URINE: Preg Test, Ur: NEGATIVE

## 2023-10-10 MED ORDER — MORPHINE SULFATE (PF) 4 MG/ML IV SOLN
4.0000 mg | Freq: Once | INTRAVENOUS | Status: AC
  Administered 2023-10-11: 4 mg via INTRAVENOUS
  Filled 2023-10-10: qty 1

## 2023-10-10 MED ORDER — ONDANSETRON HCL 4 MG/2ML IJ SOLN
4.0000 mg | Freq: Once | INTRAMUSCULAR | Status: AC
  Administered 2023-10-10: 4 mg via INTRAVENOUS
  Filled 2023-10-10: qty 2

## 2023-10-10 MED ORDER — ONDANSETRON HCL 4 MG/2ML IJ SOLN
4.0000 mg | Freq: Once | INTRAMUSCULAR | Status: AC
  Administered 2023-10-11: 4 mg via INTRAVENOUS
  Filled 2023-10-10: qty 2

## 2023-10-10 MED ORDER — ONDANSETRON 4 MG PO TBDP: 4.0000 mg | ORAL_TABLET | Freq: Three times a day (TID) | ORAL | 0 refills | Status: DC | PRN

## 2023-10-10 MED ORDER — MORPHINE SULFATE (PF) 4 MG/ML IV SOLN
4.0000 mg | Freq: Once | INTRAVENOUS | Status: AC
  Administered 2023-10-10: 4 mg via INTRAVENOUS
  Filled 2023-10-10: qty 1

## 2023-10-10 MED ORDER — OXYCODONE-ACETAMINOPHEN 5-325 MG PO TABS: 1.0000 | ORAL_TABLET | Freq: Four times a day (QID) | ORAL | 0 refills | Status: DC | PRN

## 2023-10-10 NOTE — Discharge Instructions (Addendum)
 It was a pleasure taking care of you here today.  Your workup today is reassuring.  I would make sure to follow-up outpatient-I have listed some local OB/GYN's.  Call to see if you can get into see them as well as if they take insurance.  Return for any worsening symptoms

## 2023-10-10 NOTE — ED Provider Notes (Signed)
 [place] EMERGENCY DEPARTMENT AT [facility] Provider Note   CSN: [id number] Arrival date & time: 10/10/23  1626    Patient presents with: Abdominal Pain   Allison Richard is a 38 y.o. female lower abdominal pain, goes into her back as well as diarrhea.  States she is having multiple episodes of watery loose stool.  No blood in her stool.  She has some chronic abdominal pain due to endometriosis.  [facility], [facility] however unable to follow back up with them.  No fever, dysuria, hematuria.  No concern for STI.  No recent antibiotics, travel.  Also has some left back pain is into upper gluteal region.      HPI     Prior to Admission medications   Medication Sig Start Date End Date Taking? Authorizing Provider  ondansetron  (ZOFRAN -ODT) 4 MG disintegrating tablet Take 1 tablet (4 mg total) by mouth every 8 (eight) hours as needed. 10/10/23  Yes Margues Filippini A, PA-C  oxyCODONE -acetaminophen  (PERCOCET/ROXICET) 5-325 MG tablet Take 1 tablet by mouth every 6 (six) hours as needed for severe pain (pain score 7-10). 10/10/23  Yes Jeancarlo Leffler A, PA-C  acetaminophen  (TYLENOL ) 500 MG tablet Take 1,000 mg by mouth every 6 (six) hours as needed for moderate pain or headache.     [provider]  albuterol  (PROVENTIL  HFA;VENTOLIN  HFA) 108 (90 Base) MCG/ACT inhaler Inhale 2 puffs into the lungs every 6 (six) hours as needed for wheezing or shortness of breath. 07/26/15   Joylene Lamar SQUIBB, MD  ALPRAZolam  (XANAX ) 1 MG tablet Take 1-2 tablets (1-2 mg total) by mouth 2 (two) times daily as needed for anxiety. 12/06/17   Weber, Lauraine LITTIE, PA-C  cyclobenzaprine  (FLEXERIL ) 10 MG tablet Take 1 tablet (10 mg total) by mouth 3 (three) times daily as needed for muscle spasms. 12/06/17   Weber, Lauraine LITTIE, PA-C  escitalopram  (LEXAPRO ) 20 MG tablet Take 1.5 tablets (30 mg total) by mouth daily. Patient taking differently: Take 20 mg by mouth every morning.   12/06/17   Weber, Lauraine LITTIE, PA-C  methocarbamol  (ROBAXIN ) 500 MG tablet Take 1 tablet (500 mg total) by mouth at bedtime as needed for muscle spasms. 06/21/23   Renella Fees, MD  naproxen  (NAPROSYN ) 500 MG tablet Take 1 tablet (500 mg total) by mouth 2 (two) times daily. Patient taking differently: Take 500 mg by mouth 2 (two) times daily as needed for mild pain or moderate pain.  12/06/17   Maczis, Michael M, PA-C  oxybutynin  (DITROPAN ) 5 MG tablet Take 1 tablet (5 mg total) by mouth every 8 (eight) hours as needed for bladder spasms. 01/09/18   Devere Lonni Righter, MD  QUEtiapine  (SEROQUEL ) 300 MG tablet Take 1 tablet (300 mg total) by mouth at bedtime. Patient taking differently: Take 300 mg by mouth at bedtime.  12/06/17   Allen, Lauraine LITTIE, PA-C    Allergies: Tramadol , Vicodin [hydrocodone -acetaminophen ], and Hydrocodone     Review of Systems  Constitutional: Negative.   HENT: Negative.    Respiratory: Negative.    Cardiovascular: Negative.   Gastrointestinal:  Positive for abdominal pain, diarrhea, nausea and vomiting.  Genitourinary: Negative.   Musculoskeletal: Negative.   Skin: Negative.   Neurological: Negative.   All other systems reviewed and are negative.   Updated Vital Signs BP 110/84 (BP Location: Left Arm)   Pulse 92   Temp 97.8 F (36.6 C) (Oral)   Resp 18   Ht 5' 4 (1.626 m)  Wt 79.4 kg   LMP 09/04/2023 Comment: negative HCG 09/17/23  SpO2 100%   BMI 30.04 kg/m   Physical Exam Vitals and nursing note reviewed.  Constitutional:      General: She is not in acute distress.    Appearance: She is well-developed. She is not ill-appearing, toxic-appearing or diaphoretic.  HENT:     Head: Normocephalic and atraumatic.  Eyes:     Pupils: Pupils are equal, round, and reactive to light.  Cardiovascular:     Rate and Rhythm: Normal rate.     Heart sounds: Normal heart sounds.  Pulmonary:     Effort: Pulmonary effort is normal. No respiratory distress.      Breath sounds: Normal breath sounds.  Abdominal:     General: Bowel sounds are normal. There is no distension.     Palpations: Abdomen is soft.     Tenderness: There is generalized abdominal tenderness.     Comments: Generalized tenderness, no rebound or guarding  Musculoskeletal:        General: Normal range of motion.     Cervical back: Normal range of motion.       Back:  Skin:    General: Skin is warm and dry.     Capillary Refill: Capillary refill takes less than 2 seconds.  Neurological:     General: No focal deficit present.     Mental Status: She is alert.  Psychiatric:        Mood and Affect: Mood normal.     (all labs ordered are listed, but only abnormal results are displayed) Labs Reviewed  URINALYSIS, W/ REFLEX TO CULTURE (INFECTION SUSPECTED) - Abnormal; Notable for the following components:      Result Value   APPearance HAZY (*)    Leukocytes,Ua MODERATE (*)    Bacteria, UA RARE (*)    All other components within normal limits  GASTROINTESTINAL PANEL BY PCR, STOOL (REPLACES STOOL CULTURE)  C DIFFICILE QUICK SCREEN W PCR REFLEX    CBC WITH DIFFERENTIAL/PLATELET  COMPREHENSIVE METABOLIC PANEL WITH GFR  LIPASE, BLOOD  PREGNANCY, URINE   EKG: None  Radiology: CT Renal Stone Study Result Date: 10/10/2023 EXAM: CT ABDOMEN AND PELVIS WITHOUT CONTRAST 10/10/2023 10:12:35 PM TECHNIQUE: CT of the abdomen and pelvis was performed without the administration of intravenous contrast. Multiplanar reformatted images are provided for review. Automated exposure control, iterative reconstruction, and/or weight based adjustment of the mA/kV was utilized to reduce the radiation dose to as low as reasonably achievable. COMPARISON: CT abdomen and pelvis dated 09/17/2023. CLINICAL HISTORY: Left lower abdominal pain that moves to left flank for 3 days. Has diarrhea. Feels nauseous. No burning urination. Abdominal/flank pain, stone suspected. FINDINGS: LOWER CHEST: No acute  abnormality. LIVER: The liver is unremarkable. GALLBLADDER AND BILE DUCTS: Gallbladder is unremarkable. No biliary ductal dilatation. SPLEEN: No acute abnormality. PANCREAS: No acute abnormality. ADRENAL GLANDS: No acute abnormality. KIDNEYS, URETERS AND BLADDER: No stones in the kidneys or ureters. No hydronephrosis. No perinephric or periureteral stranding. Urinary bladder is unremarkable. GI AND BOWEL: Stomach demonstrates no acute abnormality. There is no bowel obstruction. No bowel wall thickening. Normal appendix. PERITONEUM AND RETROPERITONEUM: No ascites. No free air. VASCULATURE: Aorta is normal in caliber. LYMPH NODES: No lymphadenopathy. REPRODUCTIVE ORGANS: No acute abnormality. BONES AND SOFT TISSUES: No acute osseous abnormality. No focal soft tissue abnormality. IMPRESSION: 1. No acute findings in the abdomen or pelvis. Electronically signed by: Norman Gatlin MD 10/10/2023 10:21 PM EDT RP Workstation: [id number]  Procedures   Medications Ordered in the ED  morphine  (PF) 4 MG/ML injection 4 mg (has no administration in time range)  ondansetron  (ZOFRAN ) injection 4 mg (has no administration in time range)  morphine  (PF) 4 MG/ML injection 4 mg (4 mg Intravenous Given 10/10/23 2241)  ondansetron  (ZOFRAN ) injection 4 mg (4 mg Intravenous Given 10/10/23 6150)    38 year old here for evaluation of abdominal pain, diarrhea.  She has ongoing abdominal pain due to endometriosis, not currently followed by OB/GYN.  No urinary symptoms.  Multiple episodes of watery stool.  No recent antibiotics or travel.  No bloody stool.  Has some diffuse tenderness, no lateralizing pain.  Will plan on labs, imaging and reassess  Labs and imaging personally viewed and interpreted:  CBC without leukocytosis, hemoglobin 12.9 Metabolic panel without significant abnormality Lipase 30 UA negative for infection, moderate leuks, rare bacteria CT stone study wo acute abnormality  Reassessed.  Has not been able  to provide stool sample. Given pain meds, nausea meds. Will have her FU with Obgyn, GI.    Suspect likely her back pain is more likely MSK in nature.  She does not want Toradol , muscle relaxers.  Requesting additional antiemetic, pain medicine.  Patient is nontoxic, nonseptic appearing, in no apparent distress.  Patient's pain and other symptoms adequately managed in emergency department.  Fluid bolus given.  Labs, imaging and vitals reviewed.  Patient does not meet the SIRS or Sepsis criteria.  On repeat exam patient does not have a surgical abdomin and there are no peritoneal signs.  No indication of appendicitis, bowel obstruction, bowel perforation, cholecystitis, diverticulitis, PID, intermittent, persistent torsion, TOA, ectopic pregnancy, AAA, dissection, traumatic injury.    Care transferred to [facility], [place] who will follow up and dispo patient.                                    Medical Decision Making Amount and/or Complexity of Data Reviewed External Data Reviewed: labs, radiology and notes. Labs: ordered. Decision-making details documented in ED Course. Radiology: ordered and independent interpretation performed. Decision-making details documented in ED Course.  Risk OTC drugs. Prescription drug management. Parenteral controlled substances. Decision regarding hospitalization. Diagnosis or treatment significantly limited by social determinants of health.      Final diagnoses:  Lower abdominal pain  Nausea vomiting and diarrhea  Acute left-sided low back pain without sciatica    ED Discharge Orders          Ordered    oxyCODONE -acetaminophen  (PERCOCET/ROXICET) 5-325 MG tablet  Every 6 hours PRN        10/10/23 2313    ondansetron  (ZOFRAN -ODT) 4 MG disintegrating tablet  Every 8 hours PRN        10/10/23 2313               Zamarah Ullmer A, PA-C 10/10/23 2328    Neysa Caron PARAS, DO 10/15/23 0701

## 2023-10-10 NOTE — ED Provider Triage Note (Signed)
 Emergency Medicine Provider Triage Evaluation Note  Allison Richard , a 38 y.o. female  was evaluated in triage.  Pt complains of lower abd pain, diarrhea back pain. Multiple episodes. No blood  Review of Systems  Positive:abd pain, diarrhea  Negative:   Physical Exam  BP 127/69 (BP Location: Left Arm)   Pulse (!) 108   Temp 98 F (36.7 C) (Oral)   Resp 20   LMP 09/04/2023 Comment: negative HCG 09/17/23  SpO2 97%  Gen:   Awake, no distress   Resp:  Normal effort  MSK:   Moves extremities without difficulty  Other:    Medical Decision Making  Medically screening exam initiated at 4:47 PM.  Appropriate orders placed.  Comer LITTIE Lento was informed that the remainder of the evaluation will be completed by another provider, this initial triage assessment does not replace that evaluation, and the importance of remaining in the ED until their evaluation is complete.  Abd pain, diarrhea   Rakeen Gaillard A, PA-C 10/10/23 1647

## 2023-10-10 NOTE — ED Triage Notes (Signed)
 Patient has left lower abdominal pain that moves to left flank x3 days. Has diarrhea. Feels nauseous. No burning urination.

## 2023-10-11 NOTE — ED Provider Notes (Signed)
  Physical Exam  BP 117/77   Pulse 87   Temp 98.8 F (37.1 C)   Resp 18   Ht 5' 4 (1.626 m)   Wt 79.4 kg   LMP 09/04/2023 Comment: negative HCG 09/17/23  SpO2 99%   BMI 30.04 kg/m   Physical Exam  Procedures  Procedures  ED Course / MDM    Medical Decision Making Amount and/or Complexity of Data Reviewed Labs: ordered. Radiology: ordered.  Risk Prescription drug management.   Patient care assumed at shift handoff from previous provider.  See her note for full details.  In short, 38 year old female patient with lower abdominal pain with diarrhea.  Reassuring workup prior to my assumption of care.  Plan to have patient receive an additional dose of pain medication and if patient improves she can discharge home and follow-up as an outpatient.  Reassessed the patient after pain medication dose and the patient is ready to discharge home.  She appears stable.  Discharge instructions provided.       Logan Ubaldo NOVAK, PA-C 10/11/23 0128    Bari Charmaine FALCON, MD 10/11/23 [phone]

## 2023-10-16 ENCOUNTER — Emergency Department (HOSPITAL_COMMUNITY)
Admission: EM | Admit: 2023-10-16 | Discharge: 2023-10-17 | Disposition: A | Discharge status: Home / Self Care | Attending: Emergency Medicine | Admitting: Emergency Medicine

## 2023-10-16 ENCOUNTER — Other Ambulatory Visit: Payer: Self-pay

## 2023-10-16 ENCOUNTER — Encounter (HOSPITAL_COMMUNITY): Payer: Self-pay

## 2023-10-16 DIAGNOSIS — R109 Unspecified abdominal pain: Secondary | ICD-10-CM | POA: Diagnosis present

## 2023-10-16 LAB — BASIC METABOLIC PANEL WITH GFR
Anion gap: 12 (ref 5–15)
BUN: 9 mg/dL (ref 6–20)
CO2: 20 mmol/L — ABNORMAL LOW (ref 22–32)
Calcium: 9.3 mg/dL (ref 8.9–10.3)
Chloride: 105 mmol/L (ref 98–111)
Creatinine, Ser: 0.95 mg/dL (ref 0.44–1.00)
GFR, Estimated: >60 mL/min (ref >60)
Glucose, Bld: 96 mg/dL (ref 70–99)
Potassium: 3.5 mmol/L (ref 3.5–5.1)
Sodium: 137 mmol/L (ref 135–145)

## 2023-10-16 LAB — URINALYSIS, W/ REFLEX TO CULTURE (INFECTION SUSPECTED)
Bilirubin Urine: NEGATIVE
Glucose, UA: NEGATIVE mg/dL
Ketones, ur: NEGATIVE mg/dL
Nitrite: NEGATIVE
Protein, ur: 100 mg/dL — AB
RBC / HPF: >50 RBC/hpf (ref 0–5)
Specific Gravity, Urine: 1.026 (ref 1.005–1.030)
pH: 6 (ref 5.0–8.0)

## 2023-10-16 LAB — CBC
HCT: 36.8 % (ref 36.0–46.0)
Hemoglobin: 12.4 g/dL (ref 12.0–15.0)
MCH: 27.6 pg (ref 26.0–34.0)
MCHC: 33.7 g/dL (ref 30.0–36.0)
MCV: 82 fL (ref 80.0–100.0)
Platelets: 236 K/uL (ref 150–400)
RBC: 4.49 MIL/uL (ref 3.87–5.11)
RDW: 13.8 % (ref 11.5–15.5)
WBC: 5.4 K/uL (ref 4.0–10.5)
nRBC: 0 % (ref 0.0–0.2)

## 2023-10-16 LAB — LIPASE, BLOOD: Lipase: 26 U/L (ref 11–51)

## 2023-10-16 LAB — HCG, SERUM, QUALITATIVE: Preg, Serum: NEGATIVE

## 2023-10-16 MED ORDER — ACETAMINOPHEN 500 MG PO TABS
1000.0000 mg | ORAL_TABLET | Freq: Once | ORAL | Status: AC
  Administered 2023-10-16: 1000 mg via ORAL
  Filled 2023-10-16: qty 2

## 2023-10-16 MED ORDER — ONDANSETRON 4 MG PO TBDP
4.0000 mg | ORAL_TABLET | Freq: Once | ORAL | Status: AC | PRN
  Administered 2023-10-16: 4 mg via ORAL
  Filled 2023-10-16: qty 1

## 2023-10-16 MED ORDER — ONDANSETRON 4 MG PO TBDP
4.0000 mg | ORAL_TABLET | Freq: Once | ORAL | Status: AC
  Administered 2023-10-16: 4 mg via ORAL
  Filled 2023-10-16: qty 1

## 2023-10-16 NOTE — ED Triage Notes (Signed)
 Reports bilateral flank pain with some nausea and vomiting. States it started 3 hours ago. Also reports some bleeding, unsure if its vaginal bleeding or coming from her urine.  Hx of kidney stones and endometriosis.

## 2023-10-17 ENCOUNTER — Emergency Department (HOSPITAL_COMMUNITY)

## 2023-10-17 MED ORDER — IOHEXOL 350 MG/ML SOLN
75.0000 mL | Freq: Once | INTRAVENOUS | Status: AC | PRN
Start: 2023-10-17 — End: 2023-10-17
  Administered 2023-10-17: 75 mL via INTRAVENOUS

## 2023-10-17 MED ORDER — FENTANYL CITRATE PF 50 MCG/ML IJ SOSY
50.0000 ug | PREFILLED_SYRINGE | Freq: Once | INTRAMUSCULAR | Status: AC
  Administered 2023-10-17: 50 ug via INTRAVENOUS
  Filled 2023-10-17: qty 1

## 2023-10-17 MED ORDER — KETOROLAC TROMETHAMINE 15 MG/ML IJ SOLN
15.0000 mg | Freq: Once | INTRAMUSCULAR | Status: AC
  Administered 2023-10-17: 15 mg via INTRAVENOUS
  Filled 2023-10-17: qty 1

## 2023-10-17 NOTE — ED Provider Triage Note (Signed)
 Emergency Medicine Provider Triage Evaluation Note  Allison Richard , a 38 y.o. female  was evaluated in triage.  Pt complains of bilateral flakn pain, with diffuse abdominal pain and hematuria. Reports this started today. She reports that she is sure it is not coming from her vagina. H/o kidney stones but is tender to the abdomen and flank.  Review of Systems  Positive:  Negative:   Physical Exam  BP 115/77 (BP Location: Left Arm)   Pulse 98   Temp 98.7 F (37.1 C)   Resp 20   Ht 5' 4 (1.626 m)   Wt 77.1 kg   LMP 09/04/2023 Comment: negative HCG 09/17/23  SpO2 100%   BMI 29.18 kg/m  Gen:   Awake, no distress   Resp:  Normal effort  MSK:   Moves extremities without difficulty  Other:  Bilateral flank tenderness to palpation. Diffuse abdominal tenderness. Soft. Non distended.   Medical Decision Making  Medically screening exam initiated at 12:28 AM.  Appropriate orders placed.  Allison Richard was informed that the remainder of the evaluation will be completed by another provider, this initial triage assessment does not replace that evaluation, and the importance of remaining in the ED until their evaluation is complete.  Patient recently seen with non contrasted study. Will order constrasted study and labs.    Bernis Ernst, [place] 10/17/23 [phone]

## 2023-10-17 NOTE — ED Provider Notes (Signed)
  EMERGENCY DEPARTMENT AT  HOSPITAL Provider Note   CSN: [id number] Arrival date & time: 10/16/23  2231     Patient presents with: Flank Pain  HPI Allison Richard is a 38 y.o. female presenting for bilateral flank pain.  She states it started 3 days ago with nausea and vomiting.  She also reports intermittent vaginal spotting.  Reports history of kidney stones and endometriosis.  She states overall pain has improved.  Was seen for abdominal pain last week at [facility] did have a reassuring workup at that time.  Denies fever and chills.  Denies any pain radiating into the chest and shortness of breath.    Flank Pain       Prior to Admission medications   Medication Sig Start Date End Date Taking? Authorizing Provider  acetaminophen  (TYLENOL ) 500 MG tablet Take 1,000 mg by mouth every 6 (six) hours as needed for moderate pain or headache.     [provider]  albuterol  (PROVENTIL  HFA;VENTOLIN  HFA) 108 (90 Base) MCG/ACT inhaler Inhale 2 puffs into the lungs every 6 (six) hours as needed for wheezing or shortness of breath. 07/26/15   Joylene Lamar SQUIBB, MD  ALPRAZolam  (XANAX ) 1 MG tablet Take 1-2 tablets (1-2 mg total) by mouth 2 (two) times daily as needed for anxiety. 12/06/17   Weber, Lauraine LITTIE, PA-C  cyclobenzaprine  (FLEXERIL ) 10 MG tablet Take 1 tablet (10 mg total) by mouth 3 (three) times daily as needed for muscle spasms. 12/06/17   Weber, Lauraine LITTIE, PA-C  escitalopram  (LEXAPRO ) 20 MG tablet Take 1.5 tablets (30 mg total) by mouth daily. Patient taking differently: Take 20 mg by mouth every morning.  12/06/17   Weber, Lauraine LITTIE, PA-C  methocarbamol  (ROBAXIN ) 500 MG tablet Take 1 tablet (500 mg total) by mouth at bedtime as needed for muscle spasms. 06/21/23   Renella Fees, MD  naproxen  (NAPROSYN ) 500 MG tablet Take 1 tablet (500 mg total) by mouth 2 (two) times daily. Patient taking differently: Take 500 mg by mouth 2 (two) times daily as needed for mild pain  or moderate pain.  12/06/17   Maczis, Michael M, PA-C  ondansetron  (ZOFRAN -ODT) 4 MG disintegrating tablet Take 1 tablet (4 mg total) by mouth every 8 (eight) hours as needed. 10/10/23   Henderly, Britni A, PA-C  oxybutynin  (DITROPAN ) 5 MG tablet Take 1 tablet (5 mg total) by mouth every 8 (eight) hours as needed for bladder spasms. 01/09/18   Devere Lonni Righter, MD  oxyCODONE -acetaminophen  (PERCOCET/ROXICET) 5-325 MG tablet Take 1 tablet by mouth every 6 (six) hours as needed for severe pain (pain score 7-10). 10/10/23   Henderly, Britni A, PA-C  QUEtiapine  (SEROQUEL ) 300 MG tablet Take 1 tablet (300 mg total) by mouth at bedtime. Patient taking differently: Take 300 mg by mouth at bedtime.  12/06/17   Allen, Lauraine LITTIE, PA-C    Allergies: Tramadol , Vicodin [hydrocodone -acetaminophen ], and Hydrocodone     Review of Systems  Genitourinary:  Positive for flank pain.    Updated Vital Signs BP 125/84 (BP Location: Left Arm)   Pulse 68   Temp 99 F (37.2 C)   Resp 18   Ht 5' 4 (1.626 m)   Wt 77.1 kg   LMP 09/04/2023 Comment: negative HCG 09/17/23  SpO2 100%   BMI 29.18 kg/m   Physical Exam Vitals and nursing note reviewed.  HENT:     Head: Normocephalic and atraumatic.     Mouth/Throat:     Mouth: Mucous  membranes are moist.  Eyes:     General:        Right eye: No discharge.        Left eye: No discharge.     Conjunctiva/sclera: Conjunctivae normal.  Cardiovascular:     Rate and Rhythm: Normal rate and regular rhythm.     Pulses: Normal pulses.     Heart sounds: Normal heart sounds.  Pulmonary:     Effort: Pulmonary effort is normal.     Breath sounds: Normal breath sounds.  Abdominal:     General: Abdomen is flat. There is no distension.     Palpations: Abdomen is soft.     Tenderness: There is no abdominal tenderness.  Skin:    General: Skin is warm and dry.  Neurological:     General: No focal deficit present.  Psychiatric:        Mood and Affect: Mood normal.      (all labs ordered are listed, but only abnormal results are displayed) Labs Reviewed  BASIC METABOLIC PANEL WITH GFR - Abnormal; Notable for the following components:      Result Value   CO2 20 (*)    All other components within normal limits  URINALYSIS, W/ REFLEX TO CULTURE (INFECTION SUSPECTED) - Abnormal; Notable for the following components:   APPearance CLOUDY (*)    Hgb urine dipstick LARGE (*)    Protein, ur 100 (*)    Leukocytes,Ua MODERATE (*)    Bacteria, UA FEW (*)    All other components within normal limits  URINE CULTURE  HCG, SERUM, QUALITATIVE  CBC  LIPASE, BLOOD    EKG: None  Radiology: CT ABDOMEN PELVIS W CONTRAST Result Date: 10/17/2023 EXAM: CT ABDOMEN AND PELVIS WITH CONTRAST 10/17/2023 02:47:56 AM TECHNIQUE: CT of the abdomen and pelvis was performed with the administration of intravenous contrast. Multiplanar reformatted images are provided for review. Automated exposure control, iterative reconstruction, and/or weight based adjustment of the mA/kV was utilized to reduce the radiation dose to as low as reasonably achievable. COMPARISON: 10/10/2023 CLINICAL HISTORY: Abdominal pain, acute, nonlocalized; Abdominal/flank pain, stone suspected. Reports bilateral flank pain with some nausea and vomiting. States it started 3 hours ago. Also reports some bleeding, unsure if its vaginal bleeding or coming from her urine. Hx of kidney stones and endometriosis. FINDINGS: LOWER CHEST: No acute abnormality. LIVER: The liver is unremarkable. GALLBLADDER AND BILE DUCTS: Gallbladder is unremarkable. No biliary ductal dilatation. SPLEEN: No acute abnormality. PANCREAS: No acute abnormality. ADRENAL GLANDS: No acute abnormality. KIDNEYS, URETERS AND BLADDER: No stones in the kidneys or ureters. No hydronephrosis. No perinephric or periureteral stranding. Urinary bladder is unremarkable. GI AND BOWEL: Stomach demonstrates no acute abnormality. There is no bowel obstruction. No  bowel wall thickening. Normal appendix (image 64). PERITONEUM AND RETROPERITONEUM: No ascites. No free air. VASCULATURE: Aorta is normal in caliber. LYMPH NODES: No lymphadenopathy. REPRODUCTIVE ORGANS: Uterus is within normal limits. BONES AND SOFT TISSUES: No acute osseous abnormality. No focal soft tissue abnormality. IMPRESSION: 1. No acute findings in the abdomen or pelvis. Electronically signed by: Pinkie Pebbles MD 10/17/2023 02:58 AM EDT RP Workstation: [id number]     Procedures   Medications Ordered in the ED  ketorolac  (TORADOL ) 15 MG/ML injection 15 mg (has no administration in time range)  ondansetron  (ZOFRAN -ODT) disintegrating tablet 4 mg (4 mg Oral Given 10/16/23 2243)  acetaminophen  (TYLENOL ) tablet 1,000 mg (1,000 mg Oral Given 10/16/23 2348)  ondansetron  (ZOFRAN -ODT) disintegrating tablet 4 mg (4 mg Oral Given 10/16/23  2348)  fentaNYL  (SUBLIMAZE ) injection 50 mcg (50 mcg Intravenous Given 10/17/23 0217)  iohexol  (OMNIPAQUE ) 350 MG/ML injection 75 mL (75 mLs Intravenous Contrast Given 10/17/23 0248)                                    Medical Decision Making Amount and/or Complexity of Data Reviewed Labs: ordered.  Risk Prescription drug management.   Initial Impression and Ddx 38 year old well-appearing female present for abdominal pain.  Exam was unremarkable.  DDx includes acute cholecystitis, pyelonephritis, kidney stone, appendicitis, diverticulitis, other. Patient PMH that increases complexity of ED encounter:  none  Interpretation of Diagnostics - I independent reviewed and interpreted the labs as followed: Hematuria, pyuria, 21-50 squamous cells  - I independently visualized the following imaging with scope of interpretation limited to determining acute life threatening conditions related to emergency care: CT ab/pelvis, which revealed no acute findings  Patient Reassessment and Ultimate Disposition/Management On reassessment, pain had improved somewhat.   Workup was unremarkable.  Reviewed her workup from last week as well and also reassuring.  We discussed establishing care with OB/GYN for her intermittent vaginal bleeding and PCP as well.  Discussed return precautions.  Discharged good condition.  Patient management required discussion with the following services or consulting groups:  None  Complexity of Problems Addressed Acute complicated illness or Injury  Additional Data Reviewed and Analyzed Further history obtained from: Past medical history and medications listed in the EMR and Prior ED visit notes  Patient Encounter Risk Assessment Consideration of hospitalization      Final diagnoses:  Abdominal pain, unspecified abdominal location    ED Discharge Orders     None          Lang Norleen POUR, PA-C 10/17/23 0646    Melvenia Motto, MD 10/17/23 [phone]

## 2023-10-17 NOTE — Discharge Instructions (Signed)
 Evaluation for your abdominal pain was overall reassuring.  If your symptoms worsen, or you have any other concerning symptom please return to the ED for further evaluation.  As we discussed recommend establishing care with a OB/GYN provider and PCP here in [place].

## 2023-12-02 ENCOUNTER — Encounter (HOSPITAL_COMMUNITY): Payer: Self-pay | Admitting: Emergency Medicine

## 2023-12-02 ENCOUNTER — Emergency Department (HOSPITAL_COMMUNITY)
Admission: EM | Admit: 2023-12-02 | Discharge: 2023-12-03 | Disposition: A | Discharge status: Home / Self Care | Attending: Emergency Medicine | Admitting: Emergency Medicine

## 2023-12-02 ENCOUNTER — Other Ambulatory Visit: Payer: Self-pay

## 2023-12-02 DIAGNOSIS — W01190A Fall on same level from slipping, tripping and stumbling with subsequent striking against furniture, initial encounter: Secondary | ICD-10-CM | POA: Diagnosis not present

## 2023-12-02 DIAGNOSIS — R519 Headache, unspecified: Secondary | ICD-10-CM | POA: Diagnosis not present

## 2023-12-02 DIAGNOSIS — W19XXXA Unspecified fall, initial encounter: Secondary | ICD-10-CM

## 2023-12-02 DIAGNOSIS — M5441 Lumbago with sciatica, right side: Secondary | ICD-10-CM | POA: Diagnosis present

## 2023-12-02 MED ORDER — KETOROLAC TROMETHAMINE 15 MG/ML IJ SOLN
15.0000 mg | Freq: Once | INTRAMUSCULAR | Status: DC
  Filled 2023-12-02: qty 1

## 2023-12-02 MED ORDER — OXYCODONE-ACETAMINOPHEN 5-325 MG PO TABS
1.0000 | ORAL_TABLET | Freq: Once | ORAL | Status: AC
  Administered 2023-12-02: 1 via ORAL
  Filled 2023-12-02: qty 1

## 2023-12-02 MED ORDER — LIDOCAINE 5 % EX PTCH
1.0000 | MEDICATED_PATCH | CUTANEOUS | Status: DC
  Administered 2023-12-02: 1 via TRANSDERMAL
  Filled 2023-12-02: qty 1

## 2023-12-02 MED ORDER — METHOCARBAMOL 500 MG PO TABS
500.0000 mg | ORAL_TABLET | Freq: Once | ORAL | Status: AC
  Administered 2023-12-02: 500 mg via ORAL
  Filled 2023-12-02: qty 1

## 2023-12-02 MED ORDER — DEXAMETHASONE SODIUM PHOSPHATE 10 MG/ML IJ SOLN
10.0000 mg | Freq: Once | INTRAMUSCULAR | Status: AC
  Administered 2023-12-02: 10 mg via INTRAVENOUS
  Filled 2023-12-02: qty 1
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
## 2023-12-02 NOTE — ED Triage Notes (Signed)
 Pt reports she tripped and fell and hit the back of her head. Reports dizziness & rt hip pain & back pain.

## 2023-12-03 ENCOUNTER — Emergency Department (HOSPITAL_COMMUNITY)

## 2023-12-03 LAB — HCG, SERUM, QUALITATIVE: Preg, Serum: NEGATIVE

## 2023-12-03 MED ORDER — PREDNISONE 10 MG (21) PO TBPK: ORAL_TABLET | Freq: Every day | ORAL | 0 refills | Status: DC

## 2023-12-03 MED ORDER — METHOCARBAMOL 500 MG PO TABS
500.0000 mg | ORAL_TABLET | Freq: Every evening | ORAL | 0 refills | Status: DC | PRN
Start: 2023-12-03 — End: 2024-01-24

## 2023-12-03 NOTE — Discharge Instructions (Signed)
 Alternate ice and heat to areas of injury 3-4 times per day to limit inflammation and spasm.  Avoid strenuous activity and heavy lifting.  We recommend Prednisone , as prescribed, in addition to Robaxin  for muscle spasms.  Do not drive or drink alcohol after taking Robaxin  as it may make you drowsy and impair your judgment.  We recommend follow-up with a primary care doctor to ensure resolution of symptoms.  Return to the ED for any new or concerning symptoms.

## 2023-12-03 NOTE — ED Notes (Signed)
 Provider requested pt to be ambulated. Pt noted to ambulate to bathroom earlier without assistance or incident.

## 2024-01-24 ENCOUNTER — Emergency Department (HOSPITAL_COMMUNITY)
Admission: EM | Admit: 2024-01-24 | Discharge: 2024-01-24 | Disposition: A | Discharge status: Home / Self Care | Attending: Emergency Medicine | Admitting: Emergency Medicine

## 2024-01-24 ENCOUNTER — Other Ambulatory Visit: Payer: Self-pay

## 2024-01-24 ENCOUNTER — Encounter (HOSPITAL_COMMUNITY): Payer: Self-pay | Admitting: Emergency Medicine

## 2024-01-24 DIAGNOSIS — M5442 Lumbago with sciatica, left side: Secondary | ICD-10-CM | POA: Insufficient documentation

## 2024-01-24 DIAGNOSIS — M5430 Sciatica, unspecified side: Secondary | ICD-10-CM

## 2024-01-24 DIAGNOSIS — Z87891 Personal history of nicotine dependence: Secondary | ICD-10-CM | POA: Diagnosis not present

## 2024-01-24 DIAGNOSIS — M545 Low back pain, unspecified: Secondary | ICD-10-CM | POA: Diagnosis present

## 2024-01-24 DIAGNOSIS — M5441 Lumbago with sciatica, right side: Secondary | ICD-10-CM | POA: Insufficient documentation

## 2024-01-24 LAB — URINALYSIS, ROUTINE W REFLEX MICROSCOPIC
Bilirubin Urine: NEGATIVE
Glucose, UA: NEGATIVE mg/dL
Hgb urine dipstick: NEGATIVE
Ketones, ur: NEGATIVE mg/dL
Nitrite: NEGATIVE
Protein, ur: NEGATIVE mg/dL
Specific Gravity, Urine: 1.027 (ref 1.005–1.030)
pH: 5 (ref 5.0–8.0)

## 2024-01-24 LAB — PREGNANCY, URINE: Preg Test, Ur: NEGATIVE

## 2024-01-24 MED ORDER — OXYCODONE-ACETAMINOPHEN 5-325 MG PO TABS
1.0000 | ORAL_TABLET | Freq: Once | ORAL | Status: AC
  Administered 2024-01-24: 1 via ORAL
  Filled 2024-01-24: qty 1

## 2024-01-24 MED ORDER — PREDNISONE 10 MG (21) PO TBPK: ORAL_TABLET | Freq: Every day | ORAL | 0 refills | Status: AC

## 2024-01-24 MED ORDER — KETOROLAC TROMETHAMINE 15 MG/ML IJ SOLN: 15.0000 mg | Freq: Once | INTRAMUSCULAR | Status: DC

## 2024-01-24 MED ORDER — CYCLOBENZAPRINE HCL 10 MG PO TABS: 10.0000 mg | ORAL_TABLET | Freq: Two times a day (BID) | ORAL | 0 refills | Status: AC | PRN

## 2024-01-24 NOTE — ED Notes (Signed)
 Pt requesting pain medicine. MD ordered toradol  - sts medicine never helps. EDP informed of pts refusal.

## 2024-01-24 NOTE — ED Notes (Signed)
  at bedside

## 2024-01-24 NOTE — Discharge Instructions (Addendum)
 We evaluated you for your back pain.  We suspect that your symptoms are caused by sciatica, pinched nerve in your back.  Please take Tylenol  (acetaminophen ) and Motrin  (ibuprofen ) for your symptoms at home.  You can take 1000 mg of Tylenol  every 6 hours and 600 mg of Motrin  every 6 hours as needed for your symptoms.  You can take these medicines together as needed, either at the same time, or alternating every 3 hours.  We have prescribed you a muscle relaxer that you can take as needed.  We have also prescribed you a steroid which can help with inflammation.  We have attached some information about back exercises and stretches that you can try at home.  Please follow-up with a primary doctor.  You can follow-up with the [facility] or family practice center.  You can also follow-up with [facility] for further back care.  You have any new or worsening symptoms such as incontinence, trouble walking, numbness or tingling in your groin, weakness in your legs, please return to the emergency department for recheck.

## 2024-01-24 NOTE — ED Notes (Signed)
 AVS provided by edp was reviewed with pt. Pt verbalized understanding with no additional questions at this time. Pt verified pharmacy. Pt sts she is going home with mother.

## 2024-01-24 NOTE — ED Triage Notes (Signed)
 Patient coming to ED for evaluation of lower back pain.  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  States pain is radiating down legs.  Having tingling in bilateral feet.

## 2024-01-25 ENCOUNTER — Encounter (HOSPITAL_COMMUNITY): Payer: Self-pay | Admitting: Emergency Medicine

## 2024-01-25 ENCOUNTER — Emergency Department (HOSPITAL_COMMUNITY)

## 2024-01-25 ENCOUNTER — Other Ambulatory Visit: Payer: Self-pay

## 2024-01-25 ENCOUNTER — Emergency Department (HOSPITAL_COMMUNITY)
Admission: EM | Admit: 2024-01-25 | Discharge: 2024-01-25 | Disposition: A | Discharge status: Home / Self Care | Attending: Emergency Medicine | Admitting: Emergency Medicine

## 2024-01-25 DIAGNOSIS — S0083XA Contusion of other part of head, initial encounter: Secondary | ICD-10-CM | POA: Diagnosis not present

## 2024-01-25 DIAGNOSIS — S060X0A Concussion without loss of consciousness, initial encounter: Secondary | ICD-10-CM | POA: Insufficient documentation

## 2024-01-25 MED ORDER — ONDANSETRON 4 MG PO TBDP
4.0000 mg | ORAL_TABLET | Freq: Once | ORAL | Status: AC
  Administered 2024-01-25: 4 mg via ORAL
  Filled 2024-01-25: qty 1

## 2024-01-25 MED ORDER — ONDANSETRON 8 MG PO TBDP: 8.0000 mg | ORAL_TABLET | Freq: Three times a day (TID) | ORAL | 0 refills | Status: AC | PRN

## 2024-01-25 MED ORDER — BACITRACIN ZINC 500 UNIT/GM EX OINT
TOPICAL_OINTMENT | Freq: Once | CUTANEOUS | Status: AC
  Administered 2024-01-25: 1 via TOPICAL
  Filled 2024-01-25: qty 0.9

## 2024-01-25 NOTE — ED Provider Notes (Signed)
 [place] EMERGENCY DEPARTMENT AT [facility] Provider Note  CSN: [id number] Arrival date & time: 01/24/24 1730  Chief Complaint(s) Back Pain  HPI Allison Richard is a 38 y.o. female hx bipolar disorder presenting to ER with low back pain. Patient reports symptoms present for today. 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 Reports some pain radiates down legs and feeling tingling in feet. Denies any fevers, chills, IVDU, trauma, weakness, trouble walking, numbness, bowel/bladder incontinence. Reports history of back pain. Seen in ER for similar 1 month ago. No urinary symptoms   Past Medical History Past Medical History:  Diagnosis Date   Adopted    Alcohol abuse    Anxiety    Bipolar 1 disorder (HCC)    Depression    Hepatitis C    01-07-2018 per pt was told mild cased, dx 2016, pcp keeps check on it because felt that it will resolve)   Herpes    History of abnormal cervical Pap smear    History of alcohol abuse    01-07-2018 per pt does not drink much anymore since 2016,  now it's very rare with special occasion   History of anemia    during pregnancy   History of panic attacks    History of seizure 2015   01-07-2018  per x1 seizure , caused by taking tramadol  with an antidepressant,  no seizure since   History of suicidal ideation    History of suicide attempt 2016   cutting wrists   Personal history of drug abuse (HCC)    01-07-2018  per pt last herion,cocaine, and cannibus 2016 (last drug screen 11/ 2017 in epic only positive for benzo's)   Right ureteral stone    Patient Active Problem List   Diagnosis Date Noted   S/P cesarean section 05/18/2016   Heroin use 11/26/2014   Chest pain 11/04/2014   Seizures (HCC) 12/22/2013   Anxiety state 12/22/2013   Depression 12/22/2013   Depression with anxiety 12/08/2011   Acne 12/08/2011   Insomnia 12/08/2011   Home Medication(s) Prior to Admission medications   Medication Sig  Start Date End Date Taking? Authorizing Provider  cyclobenzaprine  (FLEXERIL ) 10 MG tablet Take 1 tablet (10 mg total) by mouth 2 (two) times daily as needed for muscle spasms. 01/24/24  Yes Francesca Elsie LITTIE, MD  acetaminophen  (TYLENOL ) 500 MG tablet Take 1,000 mg by mouth every 6 (six) hours as needed for moderate pain or headache.     [provider]  albuterol  (PROVENTIL  HFA;VENTOLIN  HFA) 108 (90 Base) MCG/ACT inhaler Inhale 2 puffs into the lungs every 6 (six) hours as needed for wheezing or shortness of breath. 07/26/15   Joylene Lamar SQUIBB, MD  ALPRAZolam  (XANAX ) 1 MG tablet Take 1-2 tablets (1-2 mg total) by mouth 2 (two) times daily as needed for anxiety. 12/06/17   Weber, Lauraine LITTIE, PA-C  escitalopram  (LEXAPRO ) 20 MG tablet Take 1.5 tablets (30 mg total) by mouth daily. Patient taking differently: Take 20 mg by mouth every morning.  12/06/17   Weber, Lauraine LITTIE, PA-C  naproxen  (NAPROSYN ) 500 MG tablet Take 1 tablet (500 mg total) by mouth 2 (two) times daily. Patient taking differently: Take 500 mg by mouth 2 (two) times daily as needed for mild pain or moderate pain.  12/06/17   Maczis, Michael M, PA-C  ondansetron  (ZOFRAN -ODT) 4 MG disintegrating tablet Take 1 tablet (4 mg total) by mouth every 8 (eight) hours as needed. 10/10/23  Henderly, Britni A, PA-C  oxybutynin  (DITROPAN ) 5 MG tablet Take 1 tablet (5 mg total) by mouth every 8 (eight) hours as needed for bladder spasms. 01/09/18   Devere Lonni Righter, MD  predniSONE  (STERAPRED UNI-PAK 21 TAB) 10 MG (21) TBPK tablet Take by mouth daily. Take 6 tabs by mouth daily  for 2 days, then 5 tabs for 2 days, then 4 tabs for 2 days, then 3 tabs for 2 days, 2 tabs for 2 days, then 1 tab by mouth daily for 2 days 01/24/24   Francesca Elsie CROME, MD  QUEtiapine  (SEROQUEL ) 300 MG tablet Take 1 tablet (300 mg total) by mouth at bedtime. Patient taking differently: Take 300 mg by mouth at bedtime.  12/06/17   Allen Lauraine CROME, PA-C                                                                                                                                     Past Surgical History Past Surgical History:  Procedure Laterality Date   CESAREAN SECTION  04/02/2007   @[facility]    CESAREAN SECTION N/A 05/18/2016   Procedure: CESAREAN SECTION;  Surgeon: Duwaine Blumenthal, DO;  Location: [facility];  Service: Obstetrics;  Laterality: N/A;   CLOSED REDUCTION METACARPAL WITH PERCUTANEOUS PINNING  2010   right hand   CYSTOSCOPY/URETEROSCOPY/HOLMIUM LASER/STENT PLACEMENT Right 01/09/2018   Procedure: CYSTOSCOPY/RETROGRADE/URETEROSCOPY/STENT PLACEMENT;  Surgeon: Devere Lonni Righter, MD;  Location: [facility];  Service: Urology;  Laterality: Right;  ONLY NEEDS 45 MIN   FRACTURE SURGERY     TONSILLECTOMY AND ADENOIDECTOMY  1997   Family History Family History  Adopted: Yes  Problem Relation Age of Onset   Bipolar disorder Brother     Social History Social History   Tobacco Use   Smoking status: Former    Current packs/day: 0.00    Types: Cigarettes    Start date: 07/29/2007    Quit date: 07/28/2017    Years since quitting: 6.4   Smokeless tobacco: Never   Tobacco comments:    01-07-2018 pt starts and stops smoking at times currently states not smoked since 05/ 2019  Vaping Use   Vaping status: Some Days  Substance Use Topics   Alcohol use: Not Currently    Alcohol/week: 0.0 standard drinks of alcohol    Comment: hx alcohol abuse--  01-07-2018 stated very rare for special occasion   Drug use: Not Currently    Types: Heroin, Cocaine, Marijuana, Benzodiazepines    Comment: 01-07-2018  hx heroin, cocaine abuse, and marijuana per pt last used 2016   Allergies Tramadol , Vicodin [hydrocodone -acetaminophen ], and Hydrocodone   Review of Systems Review of Systems  All other systems reviewed and are negative.   Physical Exam Vital Signs  I have reviewed the triage vital signs BP (!) 126/90   Pulse 97   Temp 98.7 F  (37.1 C) (Oral)   Resp 16   Ht  5' 4 (1.626 m)   Wt 79.4 kg   SpO2 100%   BMI 30.04 kg/m  Physical Exam Vitals and nursing note reviewed.  Constitutional:      Appearance: Normal appearance.  HENT:     Head: Normocephalic and atraumatic.     Mouth/Throat:     Mouth: Mucous membranes are moist.  Eyes:     Conjunctiva/sclera: Conjunctivae normal.  Cardiovascular:     Rate and Rhythm: Normal rate.  Pulmonary:     Effort: Pulmonary effort is normal. No respiratory distress.  Abdominal:     General: Abdomen is flat.  Musculoskeletal:        General: No deformity.     Comments: No midline c/t/L spine tenderness. Mild bilateral low back paraspinal ttp   Skin:    General: Skin is warm and dry.     Capillary Refill: Capillary refill takes less than 2 seconds.  Neurological:     General: No focal deficit present.     Mental Status: She is alert. Mental status is at baseline.     Comments: Strength 5/5 ble, normal gait, no sensory deficit to light touch, 2+ bilateral patellar reflexes  Psychiatric:        Mood and Affect: Mood normal.        Behavior: Behavior normal.     ED Results and Treatments Labs (all labs ordered are listed, but only abnormal results are displayed) Labs Reviewed  URINALYSIS, ROUTINE W REFLEX MICROSCOPIC - Abnormal; Notable for the following components:      Result Value   APPearance HAZY (*)    Leukocytes,Ua MODERATE (*)    Bacteria, UA FEW (*)    All other components within normal limits  PREGNANCY, URINE                                                                                                                          Radiology No results found.  Pertinent labs & imaging results that were available during my care of the patient were reviewed by me and considered in my medical decision making (see MDM for details).  Medications Ordered in ED Medications  oxyCODONE -acetaminophen  (PERCOCET/ROXICET) 5-325 MG per tablet 1 tablet (1 tablet  Oral Given 01/24/24 2203)  Procedures Procedures  (including critical care time)  Medical Decision Making / ED Course   MDM:  38 yo presenting with low back pain  Symptoms seem most consistent with low back strain, sciatica. Doubt dangerous process e.g. occult infectious process, fracture, spinal cord compression. Examination is reassuring. UA was sent by triage RN with some bacturia however patient denies any UTI symptoms so would not treat this. Will rx muscle relaxer, steroid, recommended OTC therapy and back exercises. Will discharge patient to home. All questions answered. Patient comfortable with plan of discharge. Return precautions discussed with patient and specified on the after visit summary.       Additional history obtained:  -External records from outside source obtained and reviewed including: Chart review including previous notes, labs, imaging, consultation notes including prior ER visit for simialr    Lab Tests: -I ordered, reviewed, and interpreted labs.   The pertinent results include:   Labs Reviewed  URINALYSIS, ROUTINE W REFLEX MICROSCOPIC - Abnormal; Notable for the following components:      Result Value   APPearance HAZY (*)    Leukocytes,Ua MODERATE (*)    Bacteria, UA FEW (*)    All other components within normal limits  PREGNANCY, URINE    Notable for asymptomatic bacturia   Medicines ordered and prescription drug management: Meds ordered this encounter  Medications   DISCONTD: ketorolac  (TORADOL ) 15 MG/ML injection 15 mg   oxyCODONE -acetaminophen  (PERCOCET/ROXICET) 5-325 MG per tablet 1 tablet    Refill:  0   cyclobenzaprine  (FLEXERIL ) 10 MG tablet    Sig: Take 1 tablet (10 mg total) by mouth 2 (two) times daily as needed for muscle spasms.    Dispense:  20 tablet    Refill:  0   predniSONE  (STERAPRED UNI-PAK  21 TAB) 10 MG (21) TBPK tablet    Sig: Take by mouth daily. Take 6 tabs by mouth daily  for 2 days, then 5 tabs for 2 days, then 4 tabs for 2 days, then 3 tabs for 2 days, 2 tabs for 2 days, then 1 tab by mouth daily for 2 days    Dispense:  42 tablet    Refill:  0    Social Determinants of Health:  Diagnosis or treatment significantly limited by social determinants of health: obesity   Reevaluation: After the interventions noted above, I reevaluated the patient and found that their symptoms have improved  Co morbidities that complicate the patient evaluation  Past Medical History:  Diagnosis Date   Adopted    Alcohol abuse    Anxiety    Bipolar 1 disorder (HCC)    Depression    Hepatitis C    01-07-2018 per pt was told mild cased, dx 2016, pcp keeps check on it because felt that it will resolve)   Herpes    History of abnormal cervical Pap smear    History of alcohol abuse    01-07-2018 per pt does not drink much anymore since 2016,  now it's very rare with special occasion   History of anemia    during pregnancy   History of panic attacks    History of seizure 2015   01-07-2018  per x1 seizure , caused by taking tramadol  with an antidepressant,  no seizure since   History of suicidal ideation    History of suicide attempt 2016   cutting wrists   Personal history of drug abuse (HCC)    01-07-2018  per pt last herion,cocaine, and cannibus 2016 (last  drug screen 11/ 2017 in epic only positive for benzo's)   Right ureteral stone       Dispostion: Disposition decision including need for hospitalization was considered, and patient discharged from emergency department.    Final Clinical Impression(s) / ED Diagnoses Final diagnoses:  Sciatica, unspecified laterality     This chart was dictated using voice recognition software.  Despite best efforts to proofread,  errors can occur which can change the documentation meaning.    Francesca Elsie CROME, MD 01/25/24 [phone]

## 2024-01-25 NOTE — Discharge Instructions (Addendum)
 The CT scans did not show any signs of injury.  Take over-the-counter medications to help with pain and discomfort.  Apply ice to help with swelling.  I also prescribed Zofran  in case you experience any persistent nausea

## 2024-01-25 NOTE — ED Notes (Signed)
 Patient transported to CT
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
## 2024-02-22 ENCOUNTER — Encounter (HOSPITAL_COMMUNITY): Payer: Self-pay

## 2024-02-22 ENCOUNTER — Other Ambulatory Visit: Payer: Self-pay

## 2024-02-22 ENCOUNTER — Emergency Department (HOSPITAL_COMMUNITY)
Admission: EM | Admit: 2024-02-22 | Discharge: 2024-02-22 | Disposition: A | Discharge status: Home / Self Care | Attending: Emergency Medicine | Admitting: Emergency Medicine

## 2024-02-22 DIAGNOSIS — R000 Tachycardia, unspecified: Secondary | ICD-10-CM | POA: Insufficient documentation

## 2024-02-22 DIAGNOSIS — F410 Panic disorder [episodic paroxysmal anxiety] without agoraphobia: Secondary | ICD-10-CM | POA: Insufficient documentation

## 2024-02-22 MED ORDER — LORAZEPAM 2 MG/ML IJ SOLN
1.0000 mg | Freq: Once | INTRAMUSCULAR | Status: AC
Start: 2024-02-22 — End: 2024-02-22
  Administered 2024-02-22: 1 mg via INTRAVENOUS
  Filled 2024-02-22: qty 1

## 2024-02-22 NOTE — Discharge Instructions (Addendum)
 Today you were seen for a panic attack.  Please follow-up with [facility] behavioral health center if your symptoms persist for further evaluation and workup.  Thank you for letting us  treat you today. After reviewing your EKG and performing a physical exam, I feel you are safe to go home. Please follow up with your PCP in the next several days and provide them with your records from this visit. Return to the Emergency Room if pain becomes severe or symptoms worsen.

## 2024-02-22 NOTE — ED Notes (Signed)
 Pt in bed resting and eating crackers with sprite. Pt reports she was able to eat and drink with no complications. Pt provider notified.

## 2024-02-22 NOTE — ED Triage Notes (Signed)
 PT States she has been having a Panic attack since about 1830 she tried to get under control on her own with prescribed meds and breathing w/o success.

## 2024-02-22 NOTE — ED Provider Notes (Signed)
 [place] EMERGENCY DEPARTMENT AT [facility] Provider Note   CSN: [id number] Arrival date & time: 02/22/24  2013     Patient presents with: Panic Attack   Allison Richard is a 38 y.o. female past medical history significant for anxiety, suicidal ideation, alcohol abuse, and bipolar 1 disorder presents today for a panic attack since 1830 this evening.  Patient reports she tried to get that under control with her at home medications and breath work without improvement.  Patient reports this is similar to her previous panic attacks and states she has been working since 630 this morning at [employer] which could be a source of increased stress.  Patient denies alcohol, tobacco, or other substance use.  Patient reports compliance with home medications.   HPI     Prior to Admission medications   Medication Sig Start Date End Date Taking? Authorizing Provider  acetaminophen  (TYLENOL ) 500 MG tablet Take 1,000 mg by mouth every 6 (six) hours as needed for moderate pain or headache.     [provider]  albuterol  (PROVENTIL  HFA;VENTOLIN  HFA) 108 (90 Base) MCG/ACT inhaler Inhale 2 puffs into the lungs every 6 (six) hours as needed for wheezing or shortness of breath. 07/26/15   Joylene Lamar SQUIBB, MD  ALPRAZolam  (XANAX ) 1 MG tablet Take 1-2 tablets (1-2 mg total) by mouth 2 (two) times daily as needed for anxiety. 12/06/17   Weber, Lauraine LITTIE, PA-C  cyclobenzaprine  (FLEXERIL ) 10 MG tablet Take 1 tablet (10 mg total) by mouth 2 (two) times daily as needed for muscle spasms. 01/24/24   Francesca Elsie LITTIE, MD  escitalopram  (LEXAPRO ) 20 MG tablet Take 1.5 tablets (30 mg total) by mouth daily. Patient taking differently: Take 20 mg by mouth every morning.  12/06/17   Weber, Lauraine LITTIE, PA-C  naproxen  (NAPROSYN ) 500 MG tablet Take 1 tablet (500 mg total) by mouth 2 (two) times daily. Patient taking differently: Take 500 mg by mouth 2 (two) times daily as needed for mild pain or  moderate pain.  12/06/17   Maczis, Michael M, PA-C  ondansetron  (ZOFRAN -ODT) 8 MG disintegrating tablet Take 1 tablet (8 mg total) by mouth every 8 (eight) hours as needed for nausea or vomiting. 01/25/24   Randol Simmonds, MD  oxybutynin  (DITROPAN ) 5 MG tablet Take 1 tablet (5 mg total) by mouth every 8 (eight) hours as needed for bladder spasms. 01/09/18   Devere Lonni Righter, MD  predniSONE  (STERAPRED UNI-PAK 21 TAB) 10 MG (21) TBPK tablet Take by mouth daily. Take 6 tabs by mouth daily  for 2 days, then 5 tabs for 2 days, then 4 tabs for 2 days, then 3 tabs for 2 days, 2 tabs for 2 days, then 1 tab by mouth daily for 2 days 01/24/24   Francesca Elsie LITTIE, MD  QUEtiapine  (SEROQUEL ) 300 MG tablet Take 1 tablet (300 mg total) by mouth at bedtime. Patient taking differently: Take 300 mg by mouth at bedtime.  12/06/17   Allen, Lauraine LITTIE, PA-C    Allergies: Tramadol , Vicodin [hydrocodone -acetaminophen ], and Hydrocodone     Review of Systems  Psychiatric/Behavioral:  The patient is nervous/anxious.     Updated Vital Signs BP 107/85   Pulse (!) 57   Temp 97.6 F (36.4 C) (Oral)   Resp 16   Ht 5' 4 (1.626 m)   Wt 74.8 kg   LMP 02/04/2024   SpO2 100%   BMI 28.32 kg/m   Physical Exam Vitals and nursing note reviewed.  Constitutional:      General: She is not in acute distress.    Appearance: She is well-developed. She is not toxic-appearing.  HENT:     Head: Normocephalic and atraumatic.  Eyes:     Extraocular Movements: Extraocular movements intact.     Conjunctiva/sclera: Conjunctivae normal.  Cardiovascular:     Rate and Rhythm: Regular rhythm. Tachycardia present.     Pulses: Normal pulses.     Heart sounds: Normal heart sounds. No murmur heard. Pulmonary:     Effort: Pulmonary effort is normal. No respiratory distress.     Breath sounds: Normal breath sounds.  Abdominal:     Palpations: Abdomen is soft.     Tenderness: There is no abdominal tenderness.  Musculoskeletal:         General: No swelling.     Cervical back: Neck supple.  Skin:    General: Skin is warm and dry.     Capillary Refill: Capillary refill takes less than 2 seconds.  Neurological:     General: No focal deficit present.     Mental Status: She is alert and oriented to person, place, and time.  Psychiatric:        Mood and Affect: Mood is anxious.     (all labs ordered are listed, but only abnormal results are displayed) Labs Reviewed - No data to display  EKG: EKG Interpretation Date/Time:  Friday February 22 2024 20:19:35 EST Ventricular Rate:  109 PR Interval:    QRS Duration:  104 QT Interval:  363 QTC Calculation: 492 R Axis:   81  Text Interpretation: No significant change since last tracing Confirmed by Dean Clarity [phone]) on 02/22/2024 9:49:50 PM  Radiology: No results found.   Procedures   Medications Ordered in the ED  LORazepam  (ATIVAN ) injection 1 mg (1 mg Intravenous Given 02/22/24 2114)  LORazepam  (ATIVAN ) injection 1 mg (1 mg Intravenous Given 02/22/24 2221)                                    Medical Decision Making Amount and/or Complexity of Data Reviewed Labs: ordered.  Risk Prescription drug management.   This patient presents to the ED for concern of panic attack differential diagnosis includes anxiety, arrhythmia, anemia, electrolyte abnormality   Lab Tests:  I Ordered, and personally interpreted labs.  The pertinent results include: Initially considered labs, however patient's symptoms have all resolved with IV Ativan .  Patient has had 2 collections of blood that were not able to be ran, I decided not to draw for a third time as I do not feel this will change ED course. EKG which showed   Medicines ordered and prescription drug management:  I ordered medication including Ativan     I have reviewed the patients home medicines and have made adjustments as needed   Problem List / ED Course:  Patient tolerating p.o. without issue  prior to discharge Considered for admission or further workup however patient's vital signs, physical exam, and EKG reassuring.  Patient's symptoms likely due to panic attack.  Patient advised to follow-up at [facility] if symptoms persist.  Patient given return precautions.  I feel patient safe for discharge at this time.        Final diagnoses:  Panic attack    ED Discharge Orders     None          Francis Ileana SAILOR,  PA-C 02/22/24 2329    Dean Clarity, MD 02/26/24 BEATRIS

## 2024-04-06 ENCOUNTER — Emergency Department (HOSPITAL_COMMUNITY)
Admission: EM | Admit: 2024-04-06 | Discharge: 2024-04-07 | Attending: Emergency Medicine | Admitting: Emergency Medicine

## 2024-04-06 ENCOUNTER — Other Ambulatory Visit: Payer: Self-pay

## 2024-04-06 ENCOUNTER — Encounter (HOSPITAL_COMMUNITY): Payer: Self-pay

## 2024-04-06 DIAGNOSIS — R102 Pelvic and perineal pain unspecified side: Secondary | ICD-10-CM | POA: Diagnosis present

## 2024-04-06 DIAGNOSIS — Z5329 Procedure and treatment not carried out because of patient's decision for other reasons: Secondary | ICD-10-CM | POA: Insufficient documentation

## 2024-04-06 LAB — COMPREHENSIVE METABOLIC PANEL WITH GFR
ALT: 28 U/L (ref 0–44)
AST: 30 U/L (ref 15–41)
Albumin: 4.5 g/dL (ref 3.5–5.0)
Alkaline Phosphatase: 75 U/L (ref 38–126)
Anion gap: 9 (ref 5–15)
BUN: 11 mg/dL (ref 6–20)
CO2: 26 mmol/L (ref 22–32)
Calcium: 9.3 mg/dL (ref 8.9–10.3)
Chloride: 103 mmol/L (ref 98–111)
Creatinine, Ser: 0.88 mg/dL (ref 0.44–1.00)
GFR, Estimated: >60 mL/min
Glucose, Bld: 104 mg/dL — ABNORMAL HIGH (ref 70–99)
Potassium: 4 mmol/L (ref 3.5–5.1)
Sodium: 138 mmol/L (ref 135–145)
Total Bilirubin: 0.3 mg/dL (ref 0.0–1.2)
Total Protein: 7.4 g/dL (ref 6.5–8.1)

## 2024-04-06 LAB — URINALYSIS, ROUTINE W REFLEX MICROSCOPIC
Bilirubin Urine: NEGATIVE
Glucose, UA: NEGATIVE mg/dL
Hgb urine dipstick: NEGATIVE
Ketones, ur: NEGATIVE mg/dL
Leukocytes,Ua: NEGATIVE
Nitrite: NEGATIVE
Protein, ur: NEGATIVE mg/dL
Specific Gravity, Urine: 1.026 (ref 1.005–1.030)
pH: 8 (ref 5.0–8.0)

## 2024-04-06 LAB — CBC
HCT: 36.4 % (ref 36.0–46.0)
Hemoglobin: 11.5 g/dL — ABNORMAL LOW (ref 12.0–15.0)
MCH: 26.3 pg (ref 26.0–34.0)
MCHC: 31.6 g/dL (ref 30.0–36.0)
MCV: 83.3 fL (ref 80.0–100.0)
Platelets: 261 K/uL (ref 150–400)
RBC: 4.37 MIL/uL (ref 3.87–5.11)
RDW: 13.6 % (ref 11.5–15.5)
WBC: 5.6 K/uL (ref 4.0–10.5)
nRBC: 0 % (ref 0.0–0.2)

## 2024-04-06 LAB — LIPASE, BLOOD: Lipase: 25 U/L (ref 11–51)

## 2024-04-06 LAB — HCG, SERUM, QUALITATIVE: Preg, Serum: NEGATIVE

## 2024-04-06 MED ORDER — KETOROLAC TROMETHAMINE 30 MG/ML IJ SOLN
30.0000 mg | Freq: Once | INTRAMUSCULAR | Status: AC
  Administered 2024-04-06: 30 mg via INTRAVENOUS
  Filled 2024-04-06: qty 1

## 2024-04-06 MED ORDER — ONDANSETRON 4 MG PO TBDP
4.0000 mg | ORAL_TABLET | Freq: Once | ORAL | Status: AC | PRN
  Administered 2024-04-06: 4 mg via ORAL
  Filled 2024-04-06: qty 1

## 2024-04-06 MED ORDER — HALOPERIDOL LACTATE 5 MG/ML IJ SOLN
2.0000 mg | Freq: Once | INTRAMUSCULAR | Status: AC
  Administered 2024-04-06: 2 mg via INTRAVENOUS
  Filled 2024-04-06: qty 1

## 2024-04-06 NOTE — ED Triage Notes (Signed)
 Pt. Arrives pov c/o sudden pelvic pain x1 day. Pt. States that she has a hx of endometriosis and thinks that is why she is having pain. Reports nausea and vomiting.

## 2024-04-07 ENCOUNTER — Emergency Department (HOSPITAL_COMMUNITY)

## 2024-04-07 NOTE — ED Provider Notes (Signed)
 " [place] EMERGENCY DEPARTMENT AT [facility] Provider Note   CSN: [id number] Arrival date & time: 04/06/24  2053     Patient presents with: Pelvic Pain   Allison Richard is a 39 y.o. female.   The history is provided by the patient.  Pelvic Pain This is a recurrent problem. The current episode started yesterday. The problem occurs constantly. The problem has not changed since onset.Pertinent negatives include no chest pain, no headaches and no shortness of breath. Nothing aggravates the symptoms. Nothing relieves the symptoms. She has tried nothing for the symptoms. The treatment provided no relief.  Patient with endometriosis and Bipolar and Hepatitis C who presents with 1 day of recurrent pelvic pain.  Not on medications for endometriosis and is in between GYNs at this time.  No discharge no urinary complaints.     Past Medical History:  Diagnosis Date   Adopted    Alcohol abuse    Anxiety    Bipolar 1 disorder (HCC)    Depression    Hepatitis C    01-07-2018 per pt was told mild cased, dx 2016, pcp keeps check on it because felt that it will resolve)   Herpes    History of abnormal cervical Pap smear    History of alcohol abuse    01-07-2018 per pt does not drink much anymore since 2016,  now it's very rare with special occasion   History of anemia    during pregnancy   History of panic attacks    History of seizure 2015   01-07-2018  per x1 seizure , caused by taking tramadol  with an antidepressant,  no seizure since   History of suicidal ideation    History of suicide attempt 2016   cutting wrists   Personal history of drug abuse (HCC)    01-07-2018  per pt last herion,cocaine, and cannibus 2016 (last drug screen 11/ 2017 in epic only positive for benzo's)   Right ureteral stone      Prior to Admission medications  Medication Sig Start Date End Date Taking? Authorizing Provider  acetaminophen  (TYLENOL ) 500 MG tablet Take 1,000 mg by mouth every 6  (six) hours as needed for moderate pain or headache.     [provider]  albuterol  (PROVENTIL  HFA;VENTOLIN  HFA) 108 (90 Base) MCG/ACT inhaler Inhale 2 puffs into the lungs every 6 (six) hours as needed for wheezing or shortness of breath. 07/26/15   Joylene Lamar SQUIBB, MD  ALPRAZolam  (XANAX ) 1 MG tablet Take 1-2 tablets (1-2 mg total) by mouth 2 (two) times daily as needed for anxiety. 12/06/17   Weber, Lauraine LITTIE, PA-C  cyclobenzaprine  (FLEXERIL ) 10 MG tablet Take 1 tablet (10 mg total) by mouth 2 (two) times daily as needed for muscle spasms. 01/24/24   Francesca Elsie LITTIE, MD  escitalopram  (LEXAPRO ) 20 MG tablet Take 1.5 tablets (30 mg total) by mouth daily. Patient taking differently: Take 20 mg by mouth every morning.  12/06/17   Weber, Lauraine LITTIE, PA-C  naproxen  (NAPROSYN ) 500 MG tablet Take 1 tablet (500 mg total) by mouth 2 (two) times daily. Patient taking differently: Take 500 mg by mouth 2 (two) times daily as needed for mild pain or moderate pain.  12/06/17   Maczis, Michael M, PA-C  ondansetron  (ZOFRAN -ODT) 8 MG disintegrating tablet Take 1 tablet (8 mg total) by mouth every 8 (eight) hours as needed for nausea or vomiting. 01/25/24   Randol Simmonds, MD  oxybutynin  (DITROPAN ) 5 MG tablet  Take 1 tablet (5 mg total) by mouth every 8 (eight) hours as needed for bladder spasms. 01/09/18   Devere Lonni Righter, MD  predniSONE  (STERAPRED UNI-PAK 21 TAB) 10 MG (21) TBPK tablet Take by mouth daily. Take 6 tabs by mouth daily  for 2 days, then 5 tabs for 2 days, then 4 tabs for 2 days, then 3 tabs for 2 days, 2 tabs for 2 days, then 1 tab by mouth daily for 2 days 01/24/24   Francesca Elsie CROME, MD  QUEtiapine  (SEROQUEL ) 300 MG tablet Take 1 tablet (300 mg total) by mouth at bedtime. Patient taking differently: Take 300 mg by mouth at bedtime.  12/06/17   Allen, Lauraine CROME, PA-C    Allergies: Tramadol , Vicodin [hydrocodone -acetaminophen ], and Hydrocodone     Review of Systems  Constitutional:   Negative for fever.  Respiratory:  Negative for shortness of breath.   Cardiovascular:  Negative for chest pain.  Genitourinary:  Positive for pelvic pain. Negative for dysuria and vaginal discharge.  Neurological:  Negative for headaches.  All other systems reviewed and are negative.   Updated Vital Signs BP (!) 148/128 (BP Location: Right Arm)   Pulse (!) 116   Temp 98.1 F (36.7 C) (Oral)   Resp 18   Ht 5' 4 (1.626 m)   Wt 79.4 kg   SpO2 100%   BMI 30.04 kg/m   Physical Exam Vitals and nursing note reviewed.  Constitutional:      General: She is not in acute distress.    Appearance: Normal appearance. She is well-developed.  HENT:     Head: Normocephalic and atraumatic.     Nose: Nose normal.  Eyes:     Pupils: Pupils are equal, round, and reactive to light.  Cardiovascular:     Rate and Rhythm: Normal rate and regular rhythm.     Pulses: Normal pulses.     Heart sounds: Normal heart sounds.  Pulmonary:     Effort: Pulmonary effort is normal. No respiratory distress.     Breath sounds: Normal breath sounds.  Abdominal:     General: Bowel sounds are normal. There is no distension.     Palpations: Abdomen is soft.     Tenderness: There is no abdominal tenderness. There is no guarding or rebound.  Musculoskeletal:        General: Normal range of motion.     Cervical back: Normal range of motion and neck supple.  Skin:    General: Skin is warm and dry.     Capillary Refill: Capillary refill takes less than 2 seconds.     Findings: No erythema or rash.  Neurological:     General: No focal deficit present.     Mental Status: She is alert and oriented to person, place, and time.     Deep Tendon Reflexes: Reflexes normal.  Psychiatric:        Mood and Affect: Mood normal.     (all labs ordered are listed, but only abnormal results are displayed) Results for orders placed or performed during the hospital encounter of 04/06/24  Lipase, blood   Collection Time:  04/06/24  9:19 PM  Result Value Ref Range   Lipase 25 11 - 51 U/L  Comprehensive metabolic panel   Collection Time: 04/06/24  9:19 PM  Result Value Ref Range   Sodium 138 135 - 145 mmol/L   Potassium 4.0 3.5 - 5.1 mmol/L   Chloride 103 98 - 111 mmol/L   CO2 26  22 - 32 mmol/L   Glucose, Bld 104 (H) 70 - 99 mg/dL   BUN 11 6 - 20 mg/dL   Creatinine, Ser 9.11 0.44 - 1.00 mg/dL   Calcium  9.3 8.9 - 10.3 mg/dL   Total Protein 7.4 6.5 - 8.1 g/dL   Albumin 4.5 3.5 - 5.0 g/dL   AST 30 15 - 41 U/L   ALT 28 0 - 44 U/L   Alkaline Phosphatase 75 38 - 126 U/L   Total Bilirubin 0.3 0.0 - 1.2 mg/dL   GFR, Estimated >39 >39 mL/min   Anion gap 9 5 - 15  CBC   Collection Time: 04/06/24  9:19 PM  Result Value Ref Range   WBC 5.6 4.0 - 10.5 K/uL   RBC 4.37 3.87 - 5.11 MIL/uL   Hemoglobin 11.5 (L) 12.0 - 15.0 g/dL   HCT 63.5 63.9 - 53.9 %   MCV 83.3 80.0 - 100.0 fL   MCH 26.3 26.0 - 34.0 pg   MCHC 31.6 30.0 - 36.0 g/dL   RDW 86.3 88.4 - 84.4 %   Platelets 261 150 - 400 K/uL   nRBC 0.0 0.0 - 0.2 %  hCG, serum, qualitative   Collection Time: 04/06/24  9:19 PM  Result Value Ref Range   Preg, Serum NEGATIVE NEGATIVE  Urinalysis, Routine w reflex microscopic -Urine, Clean Catch   Collection Time: 04/06/24  9:32 PM  Result Value Ref Range   Color, Urine YELLOW YELLOW   APPearance HAZY (A) CLEAR   Specific Gravity, Urine 1.026 1.005 - 1.030   pH 8.0 5.0 - 8.0   Glucose, UA NEGATIVE NEGATIVE mg/dL   Hgb urine dipstick NEGATIVE NEGATIVE   Bilirubin Urine NEGATIVE NEGATIVE   Ketones, ur NEGATIVE NEGATIVE mg/dL   Protein, ur NEGATIVE NEGATIVE mg/dL   Nitrite NEGATIVE NEGATIVE   Leukocytes,Ua NEGATIVE NEGATIVE   No results found.   Radiology: No results found.   Procedures   Medications Ordered in the ED  ondansetron  (ZOFRAN -ODT) disintegrating tablet 4 mg (4 mg Oral Given 04/06/24 2112)  ketorolac  (TORADOL ) 30 MG/ML injection 30 mg (30 mg Intravenous Given 04/06/24 2311)  haloperidol   lactate (HALDOL ) injection 2 mg (2 mg Intravenous Given 04/06/24 2310)                                    Medical Decision Making Patient with endometriosis with recurrent pelvic pain x 1 day   Amount and/or Complexity of Data Reviewed External Data Reviewed: notes.    Details: Previous notes reviewed  Labs: ordered.    Details: Pregnancy is negative urine is negative for UTI, normal white count 5.6, hemoglobin slight low 11.5. normal sodium 138, normal potassium 4  Radiology: ordered.    Details: US  at bedside   Risk Prescription drug management. Risk Details: Well appearing, US  ordered for r/o torsion but patient left AMA.      Final diagnoses:  Pelvic pain   Informed patient left against medical advice.  US  was at bedside   ED Discharge Orders     None          Indria Bishara, MD 04/07/24 9951  "

## 2024-04-07 NOTE — Discharge Instructions (Signed)
 You have decided to leave AGAINST MEDICAL ADVICE.  If you change your mind, please return to the ED for further care.

## 2024-04-07 NOTE — ED Notes (Signed)
 Pt states she has to leave due to a family issue. Pt educated on leaving against medical advice and encouraged to stay. Pt verbalized understanding of leaving. Pt given referrals to OBGYN and PCP.
